# Patient Record
Sex: Male | Born: 1935 | Race: White | Hispanic: No | Marital: Married | Smoking: Former smoker
Health system: Southern US, Community
[De-identification: ages and names within clinical notes are randomized; demographics above are authoritative.]

## PROBLEM LIST (undated history)

## (undated) DIAGNOSIS — C4491 Basal cell carcinoma of skin, unspecified: Secondary | ICD-10-CM

## (undated) DIAGNOSIS — Z87891 Personal history of nicotine dependence: Secondary | ICD-10-CM

## (undated) DIAGNOSIS — I10 Essential (primary) hypertension: Secondary | ICD-10-CM

## (undated) DIAGNOSIS — D099 Carcinoma in situ, unspecified: Secondary | ICD-10-CM

## (undated) DIAGNOSIS — C4492 Squamous cell carcinoma of skin, unspecified: Secondary | ICD-10-CM

## (undated) DIAGNOSIS — I2510 Atherosclerotic heart disease of native coronary artery without angina pectoris: Secondary | ICD-10-CM

## (undated) DIAGNOSIS — H3530 Unspecified macular degeneration: Secondary | ICD-10-CM

## (undated) DIAGNOSIS — E785 Hyperlipidemia, unspecified: Secondary | ICD-10-CM

## (undated) DIAGNOSIS — M1990 Unspecified osteoarthritis, unspecified site: Secondary | ICD-10-CM

## (undated) DIAGNOSIS — C801 Malignant (primary) neoplasm, unspecified: Secondary | ICD-10-CM

## (undated) HISTORY — DX: Squamous cell carcinoma of skin, unspecified: C44.92

## (undated) HISTORY — PX: CARDIAC CATHETERIZATION: SHX172

## (undated) HISTORY — PX: HERNIA REPAIR: SHX51

## (undated) HISTORY — DX: Carcinoma in situ, unspecified: D09.9

## (undated) HISTORY — DX: Basal cell carcinoma of skin, unspecified: C44.91

---

## 1998-07-25 ENCOUNTER — Encounter: Payer: Self-pay | Admitting: Surgery

## 1998-07-27 ENCOUNTER — Ambulatory Visit (HOSPITAL_COMMUNITY): Admission: RE | Admit: 1998-07-27 | Discharge: 1998-07-27 | Payer: Self-pay | Admitting: Surgery

## 2008-03-31 ENCOUNTER — Telehealth: Payer: Self-pay | Admitting: Gastroenterology

## 2013-12-08 DIAGNOSIS — C4491 Basal cell carcinoma of skin, unspecified: Secondary | ICD-10-CM

## 2013-12-08 HISTORY — DX: Basal cell carcinoma of skin, unspecified: C44.91

## 2015-08-15 DIAGNOSIS — C44311 Basal cell carcinoma of skin of nose: Secondary | ICD-10-CM | POA: Diagnosis not present

## 2015-08-15 DIAGNOSIS — C4491 Basal cell carcinoma of skin, unspecified: Secondary | ICD-10-CM

## 2015-08-15 HISTORY — DX: Basal cell carcinoma of skin, unspecified: C44.91

## 2015-09-13 DIAGNOSIS — E784 Other hyperlipidemia: Secondary | ICD-10-CM | POA: Diagnosis not present

## 2015-09-13 DIAGNOSIS — R7301 Impaired fasting glucose: Secondary | ICD-10-CM | POA: Diagnosis not present

## 2015-09-13 DIAGNOSIS — I10 Essential (primary) hypertension: Secondary | ICD-10-CM | POA: Diagnosis not present

## 2015-09-13 DIAGNOSIS — Z125 Encounter for screening for malignant neoplasm of prostate: Secondary | ICD-10-CM | POA: Diagnosis not present

## 2015-09-18 DIAGNOSIS — Z6826 Body mass index (BMI) 26.0-26.9, adult: Secondary | ICD-10-CM | POA: Diagnosis not present

## 2015-09-18 DIAGNOSIS — Z1389 Encounter for screening for other disorder: Secondary | ICD-10-CM | POA: Diagnosis not present

## 2015-09-18 DIAGNOSIS — R9721 Rising PSA following treatment for malignant neoplasm of prostate: Secondary | ICD-10-CM | POA: Diagnosis not present

## 2015-09-18 DIAGNOSIS — Z0000 Encounter for general adult medical examination without abnormal findings: Secondary | ICD-10-CM | POA: Diagnosis not present

## 2015-09-18 DIAGNOSIS — R7301 Impaired fasting glucose: Secondary | ICD-10-CM | POA: Diagnosis not present

## 2015-09-18 DIAGNOSIS — I10 Essential (primary) hypertension: Secondary | ICD-10-CM | POA: Diagnosis not present

## 2015-09-18 DIAGNOSIS — H409 Unspecified glaucoma: Secondary | ICD-10-CM | POA: Diagnosis not present

## 2015-09-18 DIAGNOSIS — E784 Other hyperlipidemia: Secondary | ICD-10-CM | POA: Diagnosis not present

## 2015-09-18 DIAGNOSIS — Z23 Encounter for immunization: Secondary | ICD-10-CM | POA: Diagnosis not present

## 2015-09-18 DIAGNOSIS — K635 Polyp of colon: Secondary | ICD-10-CM | POA: Diagnosis not present

## 2015-09-21 DIAGNOSIS — R972 Elevated prostate specific antigen [PSA]: Secondary | ICD-10-CM | POA: Diagnosis not present

## 2015-09-21 DIAGNOSIS — N400 Enlarged prostate without lower urinary tract symptoms: Secondary | ICD-10-CM | POA: Diagnosis not present

## 2015-10-17 DIAGNOSIS — R972 Elevated prostate specific antigen [PSA]: Secondary | ICD-10-CM | POA: Diagnosis not present

## 2015-11-14 DIAGNOSIS — L570 Actinic keratosis: Secondary | ICD-10-CM | POA: Diagnosis not present

## 2015-11-21 DIAGNOSIS — H31092 Other chorioretinal scars, left eye: Secondary | ICD-10-CM | POA: Diagnosis not present

## 2015-11-21 DIAGNOSIS — H353220 Exudative age-related macular degeneration, left eye, stage unspecified: Secondary | ICD-10-CM | POA: Diagnosis not present

## 2015-11-21 DIAGNOSIS — Z961 Presence of intraocular lens: Secondary | ICD-10-CM | POA: Diagnosis not present

## 2015-11-21 DIAGNOSIS — H40013 Open angle with borderline findings, low risk, bilateral: Secondary | ICD-10-CM | POA: Diagnosis not present

## 2015-11-22 ENCOUNTER — Encounter (INDEPENDENT_AMBULATORY_CARE_PROVIDER_SITE_OTHER): Payer: Medicare Other | Admitting: Ophthalmology

## 2015-11-22 DIAGNOSIS — H43813 Vitreous degeneration, bilateral: Secondary | ICD-10-CM

## 2015-11-22 DIAGNOSIS — H353112 Nonexudative age-related macular degeneration, right eye, intermediate dry stage: Secondary | ICD-10-CM | POA: Diagnosis not present

## 2015-11-22 DIAGNOSIS — H33021 Retinal detachment with multiple breaks, right eye: Secondary | ICD-10-CM

## 2015-11-22 DIAGNOSIS — H353221 Exudative age-related macular degeneration, left eye, with active choroidal neovascularization: Secondary | ICD-10-CM | POA: Diagnosis not present

## 2015-11-22 DIAGNOSIS — I10 Essential (primary) hypertension: Secondary | ICD-10-CM | POA: Diagnosis not present

## 2015-11-22 DIAGNOSIS — H35033 Hypertensive retinopathy, bilateral: Secondary | ICD-10-CM | POA: Diagnosis not present

## 2015-11-24 ENCOUNTER — Encounter (INDEPENDENT_AMBULATORY_CARE_PROVIDER_SITE_OTHER): Payer: Medicare Other | Admitting: Ophthalmology

## 2015-11-24 DIAGNOSIS — H33021 Retinal detachment with multiple breaks, right eye: Secondary | ICD-10-CM | POA: Diagnosis not present

## 2015-12-15 ENCOUNTER — Encounter (INDEPENDENT_AMBULATORY_CARE_PROVIDER_SITE_OTHER): Payer: Medicare Other | Admitting: Ophthalmology

## 2015-12-15 DIAGNOSIS — I10 Essential (primary) hypertension: Secondary | ICD-10-CM | POA: Diagnosis not present

## 2015-12-15 DIAGNOSIS — H43813 Vitreous degeneration, bilateral: Secondary | ICD-10-CM | POA: Diagnosis not present

## 2015-12-15 DIAGNOSIS — H353221 Exudative age-related macular degeneration, left eye, with active choroidal neovascularization: Secondary | ICD-10-CM | POA: Diagnosis not present

## 2015-12-15 DIAGNOSIS — H33021 Retinal detachment with multiple breaks, right eye: Secondary | ICD-10-CM

## 2015-12-15 DIAGNOSIS — H353112 Nonexudative age-related macular degeneration, right eye, intermediate dry stage: Secondary | ICD-10-CM

## 2015-12-15 DIAGNOSIS — H35033 Hypertensive retinopathy, bilateral: Secondary | ICD-10-CM

## 2016-01-12 ENCOUNTER — Encounter (INDEPENDENT_AMBULATORY_CARE_PROVIDER_SITE_OTHER): Payer: Medicare Other | Admitting: Ophthalmology

## 2016-01-12 DIAGNOSIS — H43813 Vitreous degeneration, bilateral: Secondary | ICD-10-CM

## 2016-01-12 DIAGNOSIS — H35033 Hypertensive retinopathy, bilateral: Secondary | ICD-10-CM

## 2016-01-12 DIAGNOSIS — H353221 Exudative age-related macular degeneration, left eye, with active choroidal neovascularization: Secondary | ICD-10-CM | POA: Diagnosis not present

## 2016-01-12 DIAGNOSIS — H33301 Unspecified retinal break, right eye: Secondary | ICD-10-CM | POA: Diagnosis not present

## 2016-01-12 DIAGNOSIS — H353112 Nonexudative age-related macular degeneration, right eye, intermediate dry stage: Secondary | ICD-10-CM

## 2016-01-12 DIAGNOSIS — I10 Essential (primary) hypertension: Secondary | ICD-10-CM | POA: Diagnosis not present

## 2016-02-12 ENCOUNTER — Encounter (INDEPENDENT_AMBULATORY_CARE_PROVIDER_SITE_OTHER): Payer: Medicare Other | Admitting: Ophthalmology

## 2016-02-12 DIAGNOSIS — H353221 Exudative age-related macular degeneration, left eye, with active choroidal neovascularization: Secondary | ICD-10-CM | POA: Diagnosis not present

## 2016-02-12 DIAGNOSIS — H43813 Vitreous degeneration, bilateral: Secondary | ICD-10-CM

## 2016-02-12 DIAGNOSIS — H353111 Nonexudative age-related macular degeneration, right eye, early dry stage: Secondary | ICD-10-CM | POA: Diagnosis not present

## 2016-02-12 DIAGNOSIS — H33301 Unspecified retinal break, right eye: Secondary | ICD-10-CM | POA: Diagnosis not present

## 2016-02-12 DIAGNOSIS — H35033 Hypertensive retinopathy, bilateral: Secondary | ICD-10-CM

## 2016-02-12 DIAGNOSIS — I10 Essential (primary) hypertension: Secondary | ICD-10-CM

## 2016-03-11 ENCOUNTER — Encounter (INDEPENDENT_AMBULATORY_CARE_PROVIDER_SITE_OTHER): Payer: Medicare Other | Admitting: Ophthalmology

## 2016-03-11 DIAGNOSIS — H353221 Exudative age-related macular degeneration, left eye, with active choroidal neovascularization: Secondary | ICD-10-CM

## 2016-03-11 DIAGNOSIS — H43813 Vitreous degeneration, bilateral: Secondary | ICD-10-CM

## 2016-03-11 DIAGNOSIS — H35033 Hypertensive retinopathy, bilateral: Secondary | ICD-10-CM | POA: Diagnosis not present

## 2016-03-11 DIAGNOSIS — I10 Essential (primary) hypertension: Secondary | ICD-10-CM | POA: Diagnosis not present

## 2016-03-11 DIAGNOSIS — H353112 Nonexudative age-related macular degeneration, right eye, intermediate dry stage: Secondary | ICD-10-CM | POA: Diagnosis not present

## 2016-03-11 DIAGNOSIS — H33301 Unspecified retinal break, right eye: Secondary | ICD-10-CM

## 2016-03-20 DIAGNOSIS — Z23 Encounter for immunization: Secondary | ICD-10-CM | POA: Diagnosis not present

## 2016-03-20 DIAGNOSIS — Z6825 Body mass index (BMI) 25.0-25.9, adult: Secondary | ICD-10-CM | POA: Diagnosis not present

## 2016-03-20 DIAGNOSIS — R7301 Impaired fasting glucose: Secondary | ICD-10-CM | POA: Diagnosis not present

## 2016-03-20 DIAGNOSIS — R972 Elevated prostate specific antigen [PSA]: Secondary | ICD-10-CM | POA: Diagnosis not present

## 2016-03-20 DIAGNOSIS — H3530 Unspecified macular degeneration: Secondary | ICD-10-CM | POA: Diagnosis not present

## 2016-03-20 DIAGNOSIS — I10 Essential (primary) hypertension: Secondary | ICD-10-CM | POA: Diagnosis not present

## 2016-03-20 DIAGNOSIS — K635 Polyp of colon: Secondary | ICD-10-CM | POA: Diagnosis not present

## 2016-03-20 DIAGNOSIS — H4089 Other specified glaucoma: Secondary | ICD-10-CM | POA: Diagnosis not present

## 2016-03-20 DIAGNOSIS — E784 Other hyperlipidemia: Secondary | ICD-10-CM | POA: Diagnosis not present

## 2016-04-04 ENCOUNTER — Encounter (INDEPENDENT_AMBULATORY_CARE_PROVIDER_SITE_OTHER): Payer: Medicare Other | Admitting: Ophthalmology

## 2016-04-04 DIAGNOSIS — H353112 Nonexudative age-related macular degeneration, right eye, intermediate dry stage: Secondary | ICD-10-CM | POA: Diagnosis not present

## 2016-04-04 DIAGNOSIS — H33301 Unspecified retinal break, right eye: Secondary | ICD-10-CM | POA: Diagnosis not present

## 2016-04-04 DIAGNOSIS — H43813 Vitreous degeneration, bilateral: Secondary | ICD-10-CM

## 2016-04-04 DIAGNOSIS — I10 Essential (primary) hypertension: Secondary | ICD-10-CM | POA: Diagnosis not present

## 2016-04-04 DIAGNOSIS — H35033 Hypertensive retinopathy, bilateral: Secondary | ICD-10-CM

## 2016-04-04 DIAGNOSIS — H353221 Exudative age-related macular degeneration, left eye, with active choroidal neovascularization: Secondary | ICD-10-CM

## 2016-05-02 ENCOUNTER — Encounter (INDEPENDENT_AMBULATORY_CARE_PROVIDER_SITE_OTHER): Payer: Medicare Other | Admitting: Ophthalmology

## 2016-05-02 DIAGNOSIS — H353112 Nonexudative age-related macular degeneration, right eye, intermediate dry stage: Secondary | ICD-10-CM | POA: Diagnosis not present

## 2016-05-02 DIAGNOSIS — H353221 Exudative age-related macular degeneration, left eye, with active choroidal neovascularization: Secondary | ICD-10-CM

## 2016-05-02 DIAGNOSIS — H43813 Vitreous degeneration, bilateral: Secondary | ICD-10-CM

## 2016-05-02 DIAGNOSIS — H33301 Unspecified retinal break, right eye: Secondary | ICD-10-CM | POA: Diagnosis not present

## 2016-05-02 DIAGNOSIS — H35033 Hypertensive retinopathy, bilateral: Secondary | ICD-10-CM

## 2016-05-02 DIAGNOSIS — I10 Essential (primary) hypertension: Secondary | ICD-10-CM

## 2016-05-23 DIAGNOSIS — C44329 Squamous cell carcinoma of skin of other parts of face: Secondary | ICD-10-CM | POA: Diagnosis not present

## 2016-05-23 DIAGNOSIS — L905 Scar conditions and fibrosis of skin: Secondary | ICD-10-CM | POA: Diagnosis not present

## 2016-05-23 DIAGNOSIS — C4492 Squamous cell carcinoma of skin, unspecified: Secondary | ICD-10-CM

## 2016-05-23 HISTORY — DX: Squamous cell carcinoma of skin, unspecified: C44.92

## 2016-05-30 ENCOUNTER — Encounter (INDEPENDENT_AMBULATORY_CARE_PROVIDER_SITE_OTHER): Payer: Medicare Other | Admitting: Ophthalmology

## 2016-05-30 DIAGNOSIS — H35033 Hypertensive retinopathy, bilateral: Secondary | ICD-10-CM | POA: Diagnosis not present

## 2016-05-30 DIAGNOSIS — I10 Essential (primary) hypertension: Secondary | ICD-10-CM

## 2016-05-30 DIAGNOSIS — H33301 Unspecified retinal break, right eye: Secondary | ICD-10-CM

## 2016-05-30 DIAGNOSIS — H353112 Nonexudative age-related macular degeneration, right eye, intermediate dry stage: Secondary | ICD-10-CM

## 2016-05-30 DIAGNOSIS — H353221 Exudative age-related macular degeneration, left eye, with active choroidal neovascularization: Secondary | ICD-10-CM | POA: Diagnosis not present

## 2016-05-30 DIAGNOSIS — H43813 Vitreous degeneration, bilateral: Secondary | ICD-10-CM

## 2016-06-27 ENCOUNTER — Encounter (INDEPENDENT_AMBULATORY_CARE_PROVIDER_SITE_OTHER): Payer: Medicare Other | Admitting: Ophthalmology

## 2016-06-27 DIAGNOSIS — H33301 Unspecified retinal break, right eye: Secondary | ICD-10-CM | POA: Diagnosis not present

## 2016-06-27 DIAGNOSIS — H353221 Exudative age-related macular degeneration, left eye, with active choroidal neovascularization: Secondary | ICD-10-CM | POA: Diagnosis not present

## 2016-06-27 DIAGNOSIS — H353112 Nonexudative age-related macular degeneration, right eye, intermediate dry stage: Secondary | ICD-10-CM | POA: Diagnosis not present

## 2016-06-27 DIAGNOSIS — H43813 Vitreous degeneration, bilateral: Secondary | ICD-10-CM | POA: Diagnosis not present

## 2016-06-27 DIAGNOSIS — I10 Essential (primary) hypertension: Secondary | ICD-10-CM | POA: Diagnosis not present

## 2016-06-27 DIAGNOSIS — H35033 Hypertensive retinopathy, bilateral: Secondary | ICD-10-CM | POA: Diagnosis not present

## 2016-07-23 DIAGNOSIS — K635 Polyp of colon: Secondary | ICD-10-CM | POA: Diagnosis not present

## 2016-07-23 DIAGNOSIS — H409 Unspecified glaucoma: Secondary | ICD-10-CM | POA: Diagnosis not present

## 2016-07-23 DIAGNOSIS — R202 Paresthesia of skin: Secondary | ICD-10-CM | POA: Diagnosis not present

## 2016-07-23 DIAGNOSIS — I10 Essential (primary) hypertension: Secondary | ICD-10-CM | POA: Diagnosis not present

## 2016-07-23 DIAGNOSIS — E784 Other hyperlipidemia: Secondary | ICD-10-CM | POA: Diagnosis not present

## 2016-07-25 ENCOUNTER — Encounter (INDEPENDENT_AMBULATORY_CARE_PROVIDER_SITE_OTHER): Payer: Medicare Other | Admitting: Ophthalmology

## 2016-07-25 DIAGNOSIS — I10 Essential (primary) hypertension: Secondary | ICD-10-CM

## 2016-07-25 DIAGNOSIS — H33301 Unspecified retinal break, right eye: Secondary | ICD-10-CM

## 2016-07-25 DIAGNOSIS — H353111 Nonexudative age-related macular degeneration, right eye, early dry stage: Secondary | ICD-10-CM | POA: Diagnosis not present

## 2016-07-25 DIAGNOSIS — H353221 Exudative age-related macular degeneration, left eye, with active choroidal neovascularization: Secondary | ICD-10-CM | POA: Diagnosis not present

## 2016-07-25 DIAGNOSIS — H35033 Hypertensive retinopathy, bilateral: Secondary | ICD-10-CM | POA: Diagnosis not present

## 2016-07-25 DIAGNOSIS — H43813 Vitreous degeneration, bilateral: Secondary | ICD-10-CM

## 2016-08-07 DIAGNOSIS — Q100 Congenital ptosis: Secondary | ICD-10-CM | POA: Diagnosis not present

## 2016-08-07 DIAGNOSIS — H353222 Exudative age-related macular degeneration, left eye, with inactive choroidal neovascularization: Secondary | ICD-10-CM | POA: Diagnosis not present

## 2016-08-07 DIAGNOSIS — H40013 Open angle with borderline findings, low risk, bilateral: Secondary | ICD-10-CM | POA: Diagnosis not present

## 2016-08-07 DIAGNOSIS — Z961 Presence of intraocular lens: Secondary | ICD-10-CM | POA: Diagnosis not present

## 2016-08-22 ENCOUNTER — Encounter (INDEPENDENT_AMBULATORY_CARE_PROVIDER_SITE_OTHER): Payer: Medicare Other | Admitting: Ophthalmology

## 2016-08-22 DIAGNOSIS — H43813 Vitreous degeneration, bilateral: Secondary | ICD-10-CM

## 2016-08-22 DIAGNOSIS — H35033 Hypertensive retinopathy, bilateral: Secondary | ICD-10-CM

## 2016-08-22 DIAGNOSIS — I10 Essential (primary) hypertension: Secondary | ICD-10-CM

## 2016-08-22 DIAGNOSIS — H353112 Nonexudative age-related macular degeneration, right eye, intermediate dry stage: Secondary | ICD-10-CM

## 2016-08-22 DIAGNOSIS — H353221 Exudative age-related macular degeneration, left eye, with active choroidal neovascularization: Secondary | ICD-10-CM | POA: Diagnosis not present

## 2016-08-22 DIAGNOSIS — H33301 Unspecified retinal break, right eye: Secondary | ICD-10-CM | POA: Diagnosis not present

## 2016-09-24 DIAGNOSIS — R7301 Impaired fasting glucose: Secondary | ICD-10-CM | POA: Diagnosis not present

## 2016-09-24 DIAGNOSIS — Z125 Encounter for screening for malignant neoplasm of prostate: Secondary | ICD-10-CM | POA: Diagnosis not present

## 2016-09-24 DIAGNOSIS — E784 Other hyperlipidemia: Secondary | ICD-10-CM | POA: Diagnosis not present

## 2016-09-24 DIAGNOSIS — I10 Essential (primary) hypertension: Secondary | ICD-10-CM | POA: Diagnosis not present

## 2016-09-26 ENCOUNTER — Encounter (INDEPENDENT_AMBULATORY_CARE_PROVIDER_SITE_OTHER): Payer: Medicare Other | Admitting: Ophthalmology

## 2016-09-26 DIAGNOSIS — H353221 Exudative age-related macular degeneration, left eye, with active choroidal neovascularization: Secondary | ICD-10-CM | POA: Diagnosis not present

## 2016-09-26 DIAGNOSIS — H35033 Hypertensive retinopathy, bilateral: Secondary | ICD-10-CM | POA: Diagnosis not present

## 2016-09-26 DIAGNOSIS — I10 Essential (primary) hypertension: Secondary | ICD-10-CM | POA: Diagnosis not present

## 2016-09-26 DIAGNOSIS — H43813 Vitreous degeneration, bilateral: Secondary | ICD-10-CM

## 2016-09-26 DIAGNOSIS — H353112 Nonexudative age-related macular degeneration, right eye, intermediate dry stage: Secondary | ICD-10-CM | POA: Diagnosis not present

## 2016-09-26 DIAGNOSIS — H33301 Unspecified retinal break, right eye: Secondary | ICD-10-CM

## 2016-10-24 ENCOUNTER — Encounter (INDEPENDENT_AMBULATORY_CARE_PROVIDER_SITE_OTHER): Payer: Medicare Other | Admitting: Ophthalmology

## 2016-10-24 DIAGNOSIS — I10 Essential (primary) hypertension: Secondary | ICD-10-CM

## 2016-10-24 DIAGNOSIS — H353112 Nonexudative age-related macular degeneration, right eye, intermediate dry stage: Secondary | ICD-10-CM | POA: Diagnosis not present

## 2016-10-24 DIAGNOSIS — H43813 Vitreous degeneration, bilateral: Secondary | ICD-10-CM

## 2016-10-24 DIAGNOSIS — H35033 Hypertensive retinopathy, bilateral: Secondary | ICD-10-CM | POA: Diagnosis not present

## 2016-10-24 DIAGNOSIS — H353221 Exudative age-related macular degeneration, left eye, with active choroidal neovascularization: Secondary | ICD-10-CM

## 2016-10-24 DIAGNOSIS — H33301 Unspecified retinal break, right eye: Secondary | ICD-10-CM

## 2016-10-28 DIAGNOSIS — R7301 Impaired fasting glucose: Secondary | ICD-10-CM | POA: Diagnosis not present

## 2016-10-28 DIAGNOSIS — H3530 Unspecified macular degeneration: Secondary | ICD-10-CM | POA: Diagnosis not present

## 2016-10-28 DIAGNOSIS — K635 Polyp of colon: Secondary | ICD-10-CM | POA: Diagnosis not present

## 2016-10-28 DIAGNOSIS — Z0000 Encounter for general adult medical examination without abnormal findings: Secondary | ICD-10-CM | POA: Diagnosis not present

## 2016-11-21 ENCOUNTER — Encounter (INDEPENDENT_AMBULATORY_CARE_PROVIDER_SITE_OTHER): Payer: Medicare Other | Admitting: Ophthalmology

## 2016-11-22 ENCOUNTER — Encounter (INDEPENDENT_AMBULATORY_CARE_PROVIDER_SITE_OTHER): Payer: Medicare Other | Admitting: Ophthalmology

## 2016-11-22 DIAGNOSIS — H35033 Hypertensive retinopathy, bilateral: Secondary | ICD-10-CM | POA: Diagnosis not present

## 2016-11-22 DIAGNOSIS — H33301 Unspecified retinal break, right eye: Secondary | ICD-10-CM | POA: Diagnosis not present

## 2016-11-22 DIAGNOSIS — H43813 Vitreous degeneration, bilateral: Secondary | ICD-10-CM | POA: Diagnosis not present

## 2016-11-22 DIAGNOSIS — H353112 Nonexudative age-related macular degeneration, right eye, intermediate dry stage: Secondary | ICD-10-CM

## 2016-11-22 DIAGNOSIS — H353221 Exudative age-related macular degeneration, left eye, with active choroidal neovascularization: Secondary | ICD-10-CM

## 2016-11-22 DIAGNOSIS — I10 Essential (primary) hypertension: Secondary | ICD-10-CM

## 2016-11-26 DIAGNOSIS — M25531 Pain in right wrist: Secondary | ICD-10-CM | POA: Diagnosis not present

## 2016-12-23 ENCOUNTER — Encounter (INDEPENDENT_AMBULATORY_CARE_PROVIDER_SITE_OTHER): Payer: Medicare Other | Admitting: Ophthalmology

## 2016-12-23 DIAGNOSIS — H33301 Unspecified retinal break, right eye: Secondary | ICD-10-CM

## 2016-12-23 DIAGNOSIS — H353112 Nonexudative age-related macular degeneration, right eye, intermediate dry stage: Secondary | ICD-10-CM

## 2016-12-23 DIAGNOSIS — H353221 Exudative age-related macular degeneration, left eye, with active choroidal neovascularization: Secondary | ICD-10-CM

## 2016-12-23 DIAGNOSIS — I10 Essential (primary) hypertension: Secondary | ICD-10-CM | POA: Diagnosis not present

## 2016-12-23 DIAGNOSIS — H35033 Hypertensive retinopathy, bilateral: Secondary | ICD-10-CM

## 2016-12-23 DIAGNOSIS — H43813 Vitreous degeneration, bilateral: Secondary | ICD-10-CM | POA: Diagnosis not present

## 2017-01-20 ENCOUNTER — Encounter (INDEPENDENT_AMBULATORY_CARE_PROVIDER_SITE_OTHER): Payer: Medicare Other | Admitting: Ophthalmology

## 2017-01-20 DIAGNOSIS — H353112 Nonexudative age-related macular degeneration, right eye, intermediate dry stage: Secondary | ICD-10-CM | POA: Diagnosis not present

## 2017-01-20 DIAGNOSIS — H353221 Exudative age-related macular degeneration, left eye, with active choroidal neovascularization: Secondary | ICD-10-CM

## 2017-01-20 DIAGNOSIS — H35033 Hypertensive retinopathy, bilateral: Secondary | ICD-10-CM | POA: Diagnosis not present

## 2017-01-20 DIAGNOSIS — H43813 Vitreous degeneration, bilateral: Secondary | ICD-10-CM | POA: Diagnosis not present

## 2017-01-20 DIAGNOSIS — I10 Essential (primary) hypertension: Secondary | ICD-10-CM

## 2017-01-20 DIAGNOSIS — H33301 Unspecified retinal break, right eye: Secondary | ICD-10-CM | POA: Diagnosis not present

## 2017-01-29 DIAGNOSIS — E784 Other hyperlipidemia: Secondary | ICD-10-CM | POA: Diagnosis not present

## 2017-01-29 DIAGNOSIS — R7301 Impaired fasting glucose: Secondary | ICD-10-CM | POA: Diagnosis not present

## 2017-01-29 DIAGNOSIS — I10 Essential (primary) hypertension: Secondary | ICD-10-CM | POA: Diagnosis not present

## 2017-01-29 DIAGNOSIS — K635 Polyp of colon: Secondary | ICD-10-CM | POA: Diagnosis not present

## 2017-02-18 ENCOUNTER — Encounter (INDEPENDENT_AMBULATORY_CARE_PROVIDER_SITE_OTHER): Payer: Medicare Other | Admitting: Ophthalmology

## 2017-02-18 DIAGNOSIS — I10 Essential (primary) hypertension: Secondary | ICD-10-CM

## 2017-02-18 DIAGNOSIS — H33301 Unspecified retinal break, right eye: Secondary | ICD-10-CM

## 2017-02-18 DIAGNOSIS — H43813 Vitreous degeneration, bilateral: Secondary | ICD-10-CM

## 2017-02-18 DIAGNOSIS — H353221 Exudative age-related macular degeneration, left eye, with active choroidal neovascularization: Secondary | ICD-10-CM

## 2017-02-18 DIAGNOSIS — H353112 Nonexudative age-related macular degeneration, right eye, intermediate dry stage: Secondary | ICD-10-CM

## 2017-02-18 DIAGNOSIS — H35033 Hypertensive retinopathy, bilateral: Secondary | ICD-10-CM | POA: Diagnosis not present

## 2017-03-18 ENCOUNTER — Encounter (INDEPENDENT_AMBULATORY_CARE_PROVIDER_SITE_OTHER): Payer: Medicare Other | Admitting: Ophthalmology

## 2017-03-18 DIAGNOSIS — H43813 Vitreous degeneration, bilateral: Secondary | ICD-10-CM | POA: Diagnosis not present

## 2017-03-18 DIAGNOSIS — H35033 Hypertensive retinopathy, bilateral: Secondary | ICD-10-CM

## 2017-03-18 DIAGNOSIS — H353221 Exudative age-related macular degeneration, left eye, with active choroidal neovascularization: Secondary | ICD-10-CM

## 2017-03-18 DIAGNOSIS — H353112 Nonexudative age-related macular degeneration, right eye, intermediate dry stage: Secondary | ICD-10-CM

## 2017-03-18 DIAGNOSIS — I10 Essential (primary) hypertension: Secondary | ICD-10-CM | POA: Diagnosis not present

## 2017-03-18 DIAGNOSIS — H33301 Unspecified retinal break, right eye: Secondary | ICD-10-CM | POA: Diagnosis not present

## 2017-04-15 ENCOUNTER — Encounter (INDEPENDENT_AMBULATORY_CARE_PROVIDER_SITE_OTHER): Payer: Medicare Other | Admitting: Ophthalmology

## 2017-04-15 DIAGNOSIS — H353221 Exudative age-related macular degeneration, left eye, with active choroidal neovascularization: Secondary | ICD-10-CM | POA: Diagnosis not present

## 2017-04-15 DIAGNOSIS — I10 Essential (primary) hypertension: Secondary | ICD-10-CM | POA: Diagnosis not present

## 2017-04-15 DIAGNOSIS — H43813 Vitreous degeneration, bilateral: Secondary | ICD-10-CM | POA: Diagnosis not present

## 2017-04-15 DIAGNOSIS — H353112 Nonexudative age-related macular degeneration, right eye, intermediate dry stage: Secondary | ICD-10-CM | POA: Diagnosis not present

## 2017-04-15 DIAGNOSIS — H35033 Hypertensive retinopathy, bilateral: Secondary | ICD-10-CM | POA: Diagnosis not present

## 2017-04-15 DIAGNOSIS — H33301 Unspecified retinal break, right eye: Secondary | ICD-10-CM | POA: Diagnosis not present

## 2017-05-13 ENCOUNTER — Encounter (INDEPENDENT_AMBULATORY_CARE_PROVIDER_SITE_OTHER): Payer: Medicare Other | Admitting: Ophthalmology

## 2017-05-13 DIAGNOSIS — H353112 Nonexudative age-related macular degeneration, right eye, intermediate dry stage: Secondary | ICD-10-CM | POA: Diagnosis not present

## 2017-05-13 DIAGNOSIS — I10 Essential (primary) hypertension: Secondary | ICD-10-CM

## 2017-05-13 DIAGNOSIS — H43813 Vitreous degeneration, bilateral: Secondary | ICD-10-CM

## 2017-05-13 DIAGNOSIS — H35033 Hypertensive retinopathy, bilateral: Secondary | ICD-10-CM

## 2017-05-13 DIAGNOSIS — H33301 Unspecified retinal break, right eye: Secondary | ICD-10-CM

## 2017-05-13 DIAGNOSIS — H353221 Exudative age-related macular degeneration, left eye, with active choroidal neovascularization: Secondary | ICD-10-CM | POA: Diagnosis not present

## 2017-05-20 DIAGNOSIS — D0439 Carcinoma in situ of skin of other parts of face: Secondary | ICD-10-CM | POA: Diagnosis not present

## 2017-05-20 DIAGNOSIS — C44311 Basal cell carcinoma of skin of nose: Secondary | ICD-10-CM | POA: Diagnosis not present

## 2017-05-20 DIAGNOSIS — D099 Carcinoma in situ, unspecified: Secondary | ICD-10-CM

## 2017-05-20 DIAGNOSIS — L570 Actinic keratosis: Secondary | ICD-10-CM | POA: Diagnosis not present

## 2017-05-20 HISTORY — DX: Carcinoma in situ, unspecified: D09.9

## 2017-06-04 DIAGNOSIS — Z0000 Encounter for general adult medical examination without abnormal findings: Secondary | ICD-10-CM | POA: Diagnosis not present

## 2017-06-04 DIAGNOSIS — E7849 Other hyperlipidemia: Secondary | ICD-10-CM | POA: Diagnosis not present

## 2017-06-04 DIAGNOSIS — R972 Elevated prostate specific antigen [PSA]: Secondary | ICD-10-CM | POA: Diagnosis not present

## 2017-06-04 DIAGNOSIS — I10 Essential (primary) hypertension: Secondary | ICD-10-CM | POA: Diagnosis not present

## 2017-06-06 ENCOUNTER — Encounter (INDEPENDENT_AMBULATORY_CARE_PROVIDER_SITE_OTHER): Payer: Medicare Other | Admitting: Ophthalmology

## 2017-06-06 DIAGNOSIS — H353112 Nonexudative age-related macular degeneration, right eye, intermediate dry stage: Secondary | ICD-10-CM | POA: Diagnosis not present

## 2017-06-06 DIAGNOSIS — H33301 Unspecified retinal break, right eye: Secondary | ICD-10-CM

## 2017-06-06 DIAGNOSIS — H35033 Hypertensive retinopathy, bilateral: Secondary | ICD-10-CM | POA: Diagnosis not present

## 2017-06-06 DIAGNOSIS — H43813 Vitreous degeneration, bilateral: Secondary | ICD-10-CM

## 2017-06-06 DIAGNOSIS — H353221 Exudative age-related macular degeneration, left eye, with active choroidal neovascularization: Secondary | ICD-10-CM | POA: Diagnosis not present

## 2017-06-06 DIAGNOSIS — I10 Essential (primary) hypertension: Secondary | ICD-10-CM

## 2017-06-26 DIAGNOSIS — C44329 Squamous cell carcinoma of skin of other parts of face: Secondary | ICD-10-CM | POA: Diagnosis not present

## 2017-06-26 DIAGNOSIS — L905 Scar conditions and fibrosis of skin: Secondary | ICD-10-CM | POA: Diagnosis not present

## 2017-07-04 ENCOUNTER — Encounter (INDEPENDENT_AMBULATORY_CARE_PROVIDER_SITE_OTHER): Payer: Medicare Other | Admitting: Ophthalmology

## 2017-07-04 DIAGNOSIS — H353112 Nonexudative age-related macular degeneration, right eye, intermediate dry stage: Secondary | ICD-10-CM | POA: Diagnosis not present

## 2017-07-04 DIAGNOSIS — H35033 Hypertensive retinopathy, bilateral: Secondary | ICD-10-CM

## 2017-07-04 DIAGNOSIS — H353221 Exudative age-related macular degeneration, left eye, with active choroidal neovascularization: Secondary | ICD-10-CM

## 2017-07-04 DIAGNOSIS — H33301 Unspecified retinal break, right eye: Secondary | ICD-10-CM | POA: Diagnosis not present

## 2017-07-04 DIAGNOSIS — I10 Essential (primary) hypertension: Secondary | ICD-10-CM | POA: Diagnosis not present

## 2017-07-04 DIAGNOSIS — H43813 Vitreous degeneration, bilateral: Secondary | ICD-10-CM

## 2017-07-08 DIAGNOSIS — H401122 Primary open-angle glaucoma, left eye, moderate stage: Secondary | ICD-10-CM | POA: Diagnosis not present

## 2017-07-08 DIAGNOSIS — H401111 Primary open-angle glaucoma, right eye, mild stage: Secondary | ICD-10-CM | POA: Diagnosis not present

## 2017-07-08 DIAGNOSIS — Z961 Presence of intraocular lens: Secondary | ICD-10-CM | POA: Diagnosis not present

## 2017-07-08 DIAGNOSIS — H353111 Nonexudative age-related macular degeneration, right eye, early dry stage: Secondary | ICD-10-CM | POA: Diagnosis not present

## 2017-07-23 DIAGNOSIS — H401111 Primary open-angle glaucoma, right eye, mild stage: Secondary | ICD-10-CM | POA: Diagnosis not present

## 2017-07-23 DIAGNOSIS — H401122 Primary open-angle glaucoma, left eye, moderate stage: Secondary | ICD-10-CM | POA: Diagnosis not present

## 2017-07-29 DIAGNOSIS — R7301 Impaired fasting glucose: Secondary | ICD-10-CM | POA: Diagnosis not present

## 2017-07-29 DIAGNOSIS — R972 Elevated prostate specific antigen [PSA]: Secondary | ICD-10-CM | POA: Diagnosis not present

## 2017-07-29 DIAGNOSIS — H3530 Unspecified macular degeneration: Secondary | ICD-10-CM | POA: Diagnosis not present

## 2017-07-29 DIAGNOSIS — I10 Essential (primary) hypertension: Secondary | ICD-10-CM | POA: Diagnosis not present

## 2017-08-01 ENCOUNTER — Encounter (INDEPENDENT_AMBULATORY_CARE_PROVIDER_SITE_OTHER): Payer: Medicare Other | Admitting: Ophthalmology

## 2017-08-01 DIAGNOSIS — H35033 Hypertensive retinopathy, bilateral: Secondary | ICD-10-CM

## 2017-08-01 DIAGNOSIS — I10 Essential (primary) hypertension: Secondary | ICD-10-CM

## 2017-08-01 DIAGNOSIS — H353221 Exudative age-related macular degeneration, left eye, with active choroidal neovascularization: Secondary | ICD-10-CM | POA: Diagnosis not present

## 2017-08-01 DIAGNOSIS — H33301 Unspecified retinal break, right eye: Secondary | ICD-10-CM

## 2017-08-01 DIAGNOSIS — H43813 Vitreous degeneration, bilateral: Secondary | ICD-10-CM

## 2017-08-01 DIAGNOSIS — H353112 Nonexudative age-related macular degeneration, right eye, intermediate dry stage: Secondary | ICD-10-CM

## 2017-08-29 ENCOUNTER — Encounter (INDEPENDENT_AMBULATORY_CARE_PROVIDER_SITE_OTHER): Payer: Medicare Other | Admitting: Ophthalmology

## 2017-08-29 DIAGNOSIS — H33301 Unspecified retinal break, right eye: Secondary | ICD-10-CM

## 2017-08-29 DIAGNOSIS — H353221 Exudative age-related macular degeneration, left eye, with active choroidal neovascularization: Secondary | ICD-10-CM | POA: Diagnosis not present

## 2017-08-29 DIAGNOSIS — I10 Essential (primary) hypertension: Secondary | ICD-10-CM | POA: Diagnosis not present

## 2017-08-29 DIAGNOSIS — H35033 Hypertensive retinopathy, bilateral: Secondary | ICD-10-CM

## 2017-08-29 DIAGNOSIS — H43813 Vitreous degeneration, bilateral: Secondary | ICD-10-CM

## 2017-08-29 DIAGNOSIS — H353112 Nonexudative age-related macular degeneration, right eye, intermediate dry stage: Secondary | ICD-10-CM

## 2017-09-08 DIAGNOSIS — C44311 Basal cell carcinoma of skin of nose: Secondary | ICD-10-CM | POA: Diagnosis not present

## 2017-09-08 DIAGNOSIS — L570 Actinic keratosis: Secondary | ICD-10-CM | POA: Diagnosis not present

## 2017-09-08 DIAGNOSIS — C4441 Basal cell carcinoma of skin of scalp and neck: Secondary | ICD-10-CM | POA: Diagnosis not present

## 2017-09-26 ENCOUNTER — Encounter (INDEPENDENT_AMBULATORY_CARE_PROVIDER_SITE_OTHER): Payer: Medicare Other | Admitting: Ophthalmology

## 2017-09-29 ENCOUNTER — Encounter (INDEPENDENT_AMBULATORY_CARE_PROVIDER_SITE_OTHER): Payer: Medicare Other | Admitting: Ophthalmology

## 2017-09-29 DIAGNOSIS — H43813 Vitreous degeneration, bilateral: Secondary | ICD-10-CM

## 2017-09-29 DIAGNOSIS — I10 Essential (primary) hypertension: Secondary | ICD-10-CM

## 2017-09-29 DIAGNOSIS — H353221 Exudative age-related macular degeneration, left eye, with active choroidal neovascularization: Secondary | ICD-10-CM

## 2017-09-29 DIAGNOSIS — H33301 Unspecified retinal break, right eye: Secondary | ICD-10-CM

## 2017-09-29 DIAGNOSIS — H353112 Nonexudative age-related macular degeneration, right eye, intermediate dry stage: Secondary | ICD-10-CM

## 2017-09-29 DIAGNOSIS — H35033 Hypertensive retinopathy, bilateral: Secondary | ICD-10-CM

## 2017-10-16 DIAGNOSIS — C4441 Basal cell carcinoma of skin of scalp and neck: Secondary | ICD-10-CM | POA: Diagnosis not present

## 2017-10-16 DIAGNOSIS — L570 Actinic keratosis: Secondary | ICD-10-CM | POA: Diagnosis not present

## 2017-10-27 ENCOUNTER — Encounter (INDEPENDENT_AMBULATORY_CARE_PROVIDER_SITE_OTHER): Payer: Medicare Other | Admitting: Ophthalmology

## 2017-10-27 DIAGNOSIS — H353112 Nonexudative age-related macular degeneration, right eye, intermediate dry stage: Secondary | ICD-10-CM

## 2017-10-27 DIAGNOSIS — H35033 Hypertensive retinopathy, bilateral: Secondary | ICD-10-CM | POA: Diagnosis not present

## 2017-10-27 DIAGNOSIS — H43813 Vitreous degeneration, bilateral: Secondary | ICD-10-CM

## 2017-10-27 DIAGNOSIS — H353221 Exudative age-related macular degeneration, left eye, with active choroidal neovascularization: Secondary | ICD-10-CM

## 2017-10-27 DIAGNOSIS — I10 Essential (primary) hypertension: Secondary | ICD-10-CM | POA: Diagnosis not present

## 2017-10-27 DIAGNOSIS — H33301 Unspecified retinal break, right eye: Secondary | ICD-10-CM

## 2017-11-03 DIAGNOSIS — C44311 Basal cell carcinoma of skin of nose: Secondary | ICD-10-CM | POA: Diagnosis not present

## 2017-11-06 DIAGNOSIS — T8189XD Other complications of procedures, not elsewhere classified, subsequent encounter: Secondary | ICD-10-CM | POA: Diagnosis not present

## 2017-11-26 ENCOUNTER — Encounter (INDEPENDENT_AMBULATORY_CARE_PROVIDER_SITE_OTHER): Payer: Medicare Other | Admitting: Ophthalmology

## 2017-11-26 DIAGNOSIS — H353221 Exudative age-related macular degeneration, left eye, with active choroidal neovascularization: Secondary | ICD-10-CM

## 2017-11-26 DIAGNOSIS — H35033 Hypertensive retinopathy, bilateral: Secondary | ICD-10-CM | POA: Diagnosis not present

## 2017-11-26 DIAGNOSIS — H43813 Vitreous degeneration, bilateral: Secondary | ICD-10-CM

## 2017-11-26 DIAGNOSIS — H353112 Nonexudative age-related macular degeneration, right eye, intermediate dry stage: Secondary | ICD-10-CM | POA: Diagnosis not present

## 2017-11-26 DIAGNOSIS — I10 Essential (primary) hypertension: Secondary | ICD-10-CM

## 2017-11-26 DIAGNOSIS — H33301 Unspecified retinal break, right eye: Secondary | ICD-10-CM

## 2017-12-23 ENCOUNTER — Encounter (INDEPENDENT_AMBULATORY_CARE_PROVIDER_SITE_OTHER): Payer: Medicare Other | Admitting: Ophthalmology

## 2017-12-23 DIAGNOSIS — H353112 Nonexudative age-related macular degeneration, right eye, intermediate dry stage: Secondary | ICD-10-CM

## 2017-12-23 DIAGNOSIS — H35033 Hypertensive retinopathy, bilateral: Secondary | ICD-10-CM | POA: Diagnosis not present

## 2017-12-23 DIAGNOSIS — H43813 Vitreous degeneration, bilateral: Secondary | ICD-10-CM

## 2017-12-23 DIAGNOSIS — H353221 Exudative age-related macular degeneration, left eye, with active choroidal neovascularization: Secondary | ICD-10-CM | POA: Diagnosis not present

## 2017-12-23 DIAGNOSIS — H33301 Unspecified retinal break, right eye: Secondary | ICD-10-CM | POA: Diagnosis not present

## 2017-12-23 DIAGNOSIS — I10 Essential (primary) hypertension: Secondary | ICD-10-CM

## 2018-01-20 ENCOUNTER — Encounter (INDEPENDENT_AMBULATORY_CARE_PROVIDER_SITE_OTHER): Payer: Medicare Other | Admitting: Ophthalmology

## 2018-01-20 DIAGNOSIS — I10 Essential (primary) hypertension: Secondary | ICD-10-CM

## 2018-01-20 DIAGNOSIS — H35033 Hypertensive retinopathy, bilateral: Secondary | ICD-10-CM

## 2018-01-20 DIAGNOSIS — H353112 Nonexudative age-related macular degeneration, right eye, intermediate dry stage: Secondary | ICD-10-CM

## 2018-01-20 DIAGNOSIS — H43813 Vitreous degeneration, bilateral: Secondary | ICD-10-CM

## 2018-01-20 DIAGNOSIS — H353221 Exudative age-related macular degeneration, left eye, with active choroidal neovascularization: Secondary | ICD-10-CM

## 2018-01-21 DIAGNOSIS — R7301 Impaired fasting glucose: Secondary | ICD-10-CM | POA: Diagnosis not present

## 2018-01-21 DIAGNOSIS — R82998 Other abnormal findings in urine: Secondary | ICD-10-CM | POA: Diagnosis not present

## 2018-01-21 DIAGNOSIS — I10 Essential (primary) hypertension: Secondary | ICD-10-CM | POA: Diagnosis not present

## 2018-01-21 DIAGNOSIS — Z125 Encounter for screening for malignant neoplasm of prostate: Secondary | ICD-10-CM | POA: Diagnosis not present

## 2018-01-22 DIAGNOSIS — H401122 Primary open-angle glaucoma, left eye, moderate stage: Secondary | ICD-10-CM | POA: Diagnosis not present

## 2018-01-22 DIAGNOSIS — H401111 Primary open-angle glaucoma, right eye, mild stage: Secondary | ICD-10-CM | POA: Diagnosis not present

## 2018-01-28 DIAGNOSIS — I10 Essential (primary) hypertension: Secondary | ICD-10-CM | POA: Diagnosis not present

## 2018-01-28 DIAGNOSIS — Z0000 Encounter for general adult medical examination without abnormal findings: Secondary | ICD-10-CM | POA: Diagnosis not present

## 2018-01-28 DIAGNOSIS — H3530 Unspecified macular degeneration: Secondary | ICD-10-CM | POA: Diagnosis not present

## 2018-01-28 DIAGNOSIS — K635 Polyp of colon: Secondary | ICD-10-CM | POA: Diagnosis not present

## 2018-02-06 DIAGNOSIS — Z1212 Encounter for screening for malignant neoplasm of rectum: Secondary | ICD-10-CM | POA: Diagnosis not present

## 2018-02-18 ENCOUNTER — Encounter (INDEPENDENT_AMBULATORY_CARE_PROVIDER_SITE_OTHER): Payer: Medicare Other | Admitting: Ophthalmology

## 2018-02-18 DIAGNOSIS — H353112 Nonexudative age-related macular degeneration, right eye, intermediate dry stage: Secondary | ICD-10-CM | POA: Diagnosis not present

## 2018-02-18 DIAGNOSIS — H35033 Hypertensive retinopathy, bilateral: Secondary | ICD-10-CM | POA: Diagnosis not present

## 2018-02-18 DIAGNOSIS — H353221 Exudative age-related macular degeneration, left eye, with active choroidal neovascularization: Secondary | ICD-10-CM | POA: Diagnosis not present

## 2018-02-18 DIAGNOSIS — H43813 Vitreous degeneration, bilateral: Secondary | ICD-10-CM

## 2018-02-18 DIAGNOSIS — H33301 Unspecified retinal break, right eye: Secondary | ICD-10-CM

## 2018-02-18 DIAGNOSIS — I10 Essential (primary) hypertension: Secondary | ICD-10-CM

## 2018-03-05 DIAGNOSIS — Z23 Encounter for immunization: Secondary | ICD-10-CM | POA: Diagnosis not present

## 2018-03-18 ENCOUNTER — Encounter (INDEPENDENT_AMBULATORY_CARE_PROVIDER_SITE_OTHER): Payer: Medicare Other | Admitting: Ophthalmology

## 2018-03-18 DIAGNOSIS — H43813 Vitreous degeneration, bilateral: Secondary | ICD-10-CM

## 2018-03-18 DIAGNOSIS — I10 Essential (primary) hypertension: Secondary | ICD-10-CM

## 2018-03-18 DIAGNOSIS — H353221 Exudative age-related macular degeneration, left eye, with active choroidal neovascularization: Secondary | ICD-10-CM

## 2018-03-18 DIAGNOSIS — H353112 Nonexudative age-related macular degeneration, right eye, intermediate dry stage: Secondary | ICD-10-CM | POA: Diagnosis not present

## 2018-03-18 DIAGNOSIS — H35033 Hypertensive retinopathy, bilateral: Secondary | ICD-10-CM

## 2018-03-18 DIAGNOSIS — H33301 Unspecified retinal break, right eye: Secondary | ICD-10-CM

## 2018-04-13 ENCOUNTER — Encounter (INDEPENDENT_AMBULATORY_CARE_PROVIDER_SITE_OTHER): Payer: Medicare Other | Admitting: Ophthalmology

## 2018-04-13 DIAGNOSIS — I10 Essential (primary) hypertension: Secondary | ICD-10-CM | POA: Diagnosis not present

## 2018-04-13 DIAGNOSIS — H35033 Hypertensive retinopathy, bilateral: Secondary | ICD-10-CM | POA: Diagnosis not present

## 2018-04-13 DIAGNOSIS — H353221 Exudative age-related macular degeneration, left eye, with active choroidal neovascularization: Secondary | ICD-10-CM

## 2018-04-13 DIAGNOSIS — H33301 Unspecified retinal break, right eye: Secondary | ICD-10-CM

## 2018-04-13 DIAGNOSIS — H353112 Nonexudative age-related macular degeneration, right eye, intermediate dry stage: Secondary | ICD-10-CM

## 2018-04-13 DIAGNOSIS — H43813 Vitreous degeneration, bilateral: Secondary | ICD-10-CM

## 2018-04-20 DIAGNOSIS — L570 Actinic keratosis: Secondary | ICD-10-CM | POA: Diagnosis not present

## 2018-04-20 DIAGNOSIS — L821 Other seborrheic keratosis: Secondary | ICD-10-CM | POA: Diagnosis not present

## 2018-05-04 ENCOUNTER — Encounter (HOSPITAL_COMMUNITY): Payer: Self-pay | Admitting: Emergency Medicine

## 2018-05-04 ENCOUNTER — Inpatient Hospital Stay (HOSPITAL_COMMUNITY)
Admission: EM | Admit: 2018-05-04 | Discharge: 2018-05-06 | DRG: 247 | Disposition: A | Discharge status: Home / Self Care | Payer: Medicare Other | Attending: Cardiovascular Disease | Admitting: Cardiovascular Disease

## 2018-05-04 ENCOUNTER — Emergency Department (HOSPITAL_COMMUNITY): Payer: Medicare Other

## 2018-05-04 ENCOUNTER — Observation Stay (HOSPITAL_BASED_OUTPATIENT_CLINIC_OR_DEPARTMENT_OTHER): Payer: Medicare Other

## 2018-05-04 ENCOUNTER — Other Ambulatory Visit: Payer: Self-pay

## 2018-05-04 DIAGNOSIS — R55 Syncope and collapse: Secondary | ICD-10-CM | POA: Diagnosis present

## 2018-05-04 DIAGNOSIS — I459 Conduction disorder, unspecified: Secondary | ICD-10-CM | POA: Diagnosis not present

## 2018-05-04 DIAGNOSIS — I361 Nonrheumatic tricuspid (valve) insufficiency: Secondary | ICD-10-CM

## 2018-05-04 DIAGNOSIS — H3530 Unspecified macular degeneration: Secondary | ICD-10-CM | POA: Diagnosis present

## 2018-05-04 DIAGNOSIS — I071 Rheumatic tricuspid insufficiency: Secondary | ICD-10-CM | POA: Diagnosis not present

## 2018-05-04 DIAGNOSIS — Z87891 Personal history of nicotine dependence: Secondary | ICD-10-CM | POA: Diagnosis not present

## 2018-05-04 DIAGNOSIS — I4581 Long QT syndrome: Secondary | ICD-10-CM | POA: Diagnosis not present

## 2018-05-04 DIAGNOSIS — I213 ST elevation (STEMI) myocardial infarction of unspecified site: Secondary | ICD-10-CM | POA: Diagnosis not present

## 2018-05-04 DIAGNOSIS — I2510 Atherosclerotic heart disease of native coronary artery without angina pectoris: Secondary | ICD-10-CM | POA: Diagnosis not present

## 2018-05-04 DIAGNOSIS — Z955 Presence of coronary angioplasty implant and graft: Secondary | ICD-10-CM

## 2018-05-04 DIAGNOSIS — R001 Bradycardia, unspecified: Secondary | ICD-10-CM | POA: Diagnosis not present

## 2018-05-04 DIAGNOSIS — E785 Hyperlipidemia, unspecified: Secondary | ICD-10-CM | POA: Diagnosis not present

## 2018-05-04 DIAGNOSIS — R778 Other specified abnormalities of plasma proteins: Secondary | ICD-10-CM

## 2018-05-04 DIAGNOSIS — I10 Essential (primary) hypertension: Secondary | ICD-10-CM | POA: Diagnosis present

## 2018-05-04 DIAGNOSIS — R7989 Other specified abnormal findings of blood chemistry: Secondary | ICD-10-CM

## 2018-05-04 DIAGNOSIS — I959 Hypotension, unspecified: Secondary | ICD-10-CM

## 2018-05-04 DIAGNOSIS — I358 Other nonrheumatic aortic valve disorders: Secondary | ICD-10-CM | POA: Diagnosis present

## 2018-05-04 DIAGNOSIS — R404 Transient alteration of awareness: Secondary | ICD-10-CM | POA: Diagnosis not present

## 2018-05-04 DIAGNOSIS — I214 Non-ST elevation (NSTEMI) myocardial infarction: Secondary | ICD-10-CM | POA: Diagnosis present

## 2018-05-04 DIAGNOSIS — Z7982 Long term (current) use of aspirin: Secondary | ICD-10-CM

## 2018-05-04 DIAGNOSIS — R9431 Abnormal electrocardiogram [ECG] [EKG]: Secondary | ICD-10-CM | POA: Diagnosis not present

## 2018-05-04 DIAGNOSIS — I25118 Atherosclerotic heart disease of native coronary artery with other forms of angina pectoris: Secondary | ICD-10-CM | POA: Diagnosis not present

## 2018-05-04 HISTORY — DX: Essential (primary) hypertension: I10

## 2018-05-04 HISTORY — DX: Hyperlipidemia, unspecified: E78.5

## 2018-05-04 HISTORY — DX: Atherosclerotic heart disease of native coronary artery without angina pectoris: I25.10

## 2018-05-04 HISTORY — DX: Malignant (primary) neoplasm, unspecified: C80.1

## 2018-05-04 HISTORY — DX: Unspecified osteoarthritis, unspecified site: M19.90

## 2018-05-04 HISTORY — DX: Unspecified macular degeneration: H35.30

## 2018-05-04 HISTORY — DX: Personal history of nicotine dependence: Z87.891

## 2018-05-04 LAB — CBC WITH DIFFERENTIAL/PLATELET
Abs Immature Granulocytes: 0.01 10*3/uL (ref 0.00–0.07)
Basophils Absolute: 0.1 10*3/uL (ref 0.0–0.1)
Basophils Relative: 1 %
EOS PCT: 3 %
Eosinophils Absolute: 0.2 10*3/uL (ref 0.0–0.5)
HEMATOCRIT: 40.6 % (ref 39.0–52.0)
HEMOGLOBIN: 13.8 g/dL (ref 13.0–17.0)
Immature Granulocytes: 0 %
LYMPHS ABS: 2.6 10*3/uL (ref 0.7–4.0)
LYMPHS PCT: 35 %
MCH: 33.7 pg (ref 26.0–34.0)
MCHC: 34 g/dL (ref 30.0–36.0)
MCV: 99 fL (ref 80.0–100.0)
MONO ABS: 0.9 10*3/uL (ref 0.1–1.0)
MONOS PCT: 12 %
Neutro Abs: 3.7 10*3/uL (ref 1.7–7.7)
Neutrophils Relative %: 49 %
Platelets: 186 10*3/uL (ref 150–400)
RBC: 4.1 MIL/uL — ABNORMAL LOW (ref 4.22–5.81)
RDW: 11.7 % (ref 11.5–15.5)
WBC: 7.5 10*3/uL (ref 4.0–10.5)
nRBC: 0 % (ref 0.0–0.2)

## 2018-05-04 LAB — COMPREHENSIVE METABOLIC PANEL
ALK PHOS: 31 U/L — AB (ref 38–126)
ALT: 16 U/L (ref 0–44)
AST: 27 U/L (ref 15–41)
Albumin: 2.9 g/dL — ABNORMAL LOW (ref 3.5–5.0)
Anion gap: 7 (ref 5–15)
BILIRUBIN TOTAL: 0.9 mg/dL (ref 0.3–1.2)
BUN: 16 mg/dL (ref 8–23)
CALCIUM: 8.6 mg/dL — AB (ref 8.9–10.3)
CO2: 23 mmol/L (ref 22–32)
CREATININE: 1.16 mg/dL (ref 0.61–1.24)
Chloride: 109 mmol/L (ref 98–111)
GFR calc non Af Amer: 57 mL/min — ABNORMAL LOW (ref >60)
GLUCOSE: 149 mg/dL — AB (ref 70–99)
Potassium: 3.6 mmol/L (ref 3.5–5.1)
Sodium: 139 mmol/L (ref 135–145)
TOTAL PROTEIN: 5.5 g/dL — AB (ref 6.5–8.1)

## 2018-05-04 LAB — HEMOGLOBIN A1C
Hgb A1c MFr Bld: 5.7 % — ABNORMAL HIGH (ref 4.8–5.6)
Mean Plasma Glucose: 116.89 mg/dL

## 2018-05-04 LAB — TSH: TSH: 1.748 u[IU]/mL (ref 0.350–4.500)

## 2018-05-04 LAB — I-STAT TROPONIN, ED: Troponin i, poc: 0.14 ng/mL (ref 0.00–0.08)

## 2018-05-04 LAB — MAGNESIUM: Magnesium: 1.9 mg/dL (ref 1.7–2.4)

## 2018-05-04 LAB — TROPONIN I
TROPONIN I: 0.26 ng/mL — AB (ref <0.03)
Troponin I: 0.13 ng/mL (ref <0.03)

## 2018-05-04 LAB — ECHOCARDIOGRAM COMPLETE

## 2018-05-04 MED ORDER — SODIUM CHLORIDE 0.9 % IV SOLN: INTRAVENOUS | Status: DC

## 2018-05-04 MED ORDER — ENOXAPARIN SODIUM 40 MG/0.4ML ~~LOC~~ SOLN: 40.0000 mg | SUBCUTANEOUS | Status: DC

## 2018-05-04 MED ORDER — MORPHINE SULFATE (PF) 2 MG/ML IV SOLN: 2.0000 mg | INTRAVENOUS | Status: DC | PRN

## 2018-05-04 MED ORDER — PROSIGHT PO TABS
1.0000 | ORAL_TABLET | Freq: Every day | ORAL | Status: DC
  Administered 2018-05-05 – 2018-05-06 (×2): 1 via ORAL
  Filled 2018-05-04 (×2): qty 1

## 2018-05-04 MED ORDER — BRIMONIDINE TARTRATE-TIMOLOL 0.2-0.5 % OP SOLN: 1.0000 [drp] | Freq: Two times a day (BID) | OPHTHALMIC | Status: DC

## 2018-05-04 MED ORDER — ASPIRIN 81 MG PO CHEW
81.0000 mg | CHEWABLE_TABLET | ORAL | Status: AC
  Administered 2018-05-05: 81 mg via ORAL
  Filled 2018-05-04: qty 1

## 2018-05-04 MED ORDER — SODIUM CHLORIDE 0.9 % IV SOLN: 250.0000 mL | INTRAVENOUS | Status: DC | PRN

## 2018-05-04 MED ORDER — ACETAMINOPHEN 325 MG PO TABS: 650.0000 mg | ORAL_TABLET | Freq: Four times a day (QID) | ORAL | Status: DC | PRN

## 2018-05-04 MED ORDER — SIMVASTATIN 40 MG PO TABS: 40.0000 mg | ORAL_TABLET | Freq: Every day | ORAL | Status: DC

## 2018-05-04 MED ORDER — HEPARIN (PORCINE) 25000 UT/250ML-% IV SOLN
900.0000 [IU]/h | INTRAVENOUS | Status: DC
  Administered 2018-05-04: 900 [IU]/h via INTRAVENOUS
  Filled 2018-05-04: qty 250

## 2018-05-04 MED ORDER — SODIUM CHLORIDE 0.45 % IV SOLN
INTRAVENOUS | Status: DC
  Administered 2018-05-04 – 2018-05-05 (×2): via INTRAVENOUS

## 2018-05-04 MED ORDER — TIMOLOL MALEATE 0.5 % OP SOLN
1.0000 [drp] | Freq: Two times a day (BID) | OPHTHALMIC | Status: DC
  Administered 2018-05-04 – 2018-05-06 (×4): 1 [drp] via OPHTHALMIC
  Filled 2018-05-04: qty 5

## 2018-05-04 MED ORDER — SODIUM CHLORIDE 0.9% FLUSH: 3.0000 mL | INTRAVENOUS | Status: DC | PRN

## 2018-05-04 MED ORDER — BENAZEPRIL HCL 40 MG PO TABS: 40.0000 mg | ORAL_TABLET | Freq: Every day | ORAL | Status: DC

## 2018-05-04 MED ORDER — ASPIRIN EC 81 MG PO TBEC
81.0000 mg | DELAYED_RELEASE_TABLET | Freq: Every day | ORAL | Status: DC
  Administered 2018-05-06: 09:00:00 81 mg via ORAL
  Filled 2018-05-04 (×2): qty 1

## 2018-05-04 MED ORDER — HEPARIN BOLUS VIA INFUSION
4000.0000 [IU] | Freq: Once | INTRAVENOUS | Status: AC
  Administered 2018-05-04: 4000 [IU] via INTRAVENOUS
  Filled 2018-05-04: qty 4000

## 2018-05-04 MED ORDER — ASPIRIN 81 MG PO CHEW
324.0000 mg | CHEWABLE_TABLET | Freq: Once | ORAL | Status: AC
  Administered 2018-05-04: 324 mg via ORAL
  Filled 2018-05-04: qty 4

## 2018-05-04 MED ORDER — ACETAMINOPHEN 650 MG RE SUPP: 650.0000 mg | Freq: Four times a day (QID) | RECTAL | Status: DC | PRN

## 2018-05-04 MED ORDER — SODIUM CHLORIDE 0.9% FLUSH: 3.0000 mL | Freq: Two times a day (BID) | INTRAVENOUS | Status: DC

## 2018-05-04 MED ORDER — OMEGA-3-ACID ETHYL ESTERS 1 G PO CAPS
1.0000 g | ORAL_CAPSULE | Freq: Two times a day (BID) | ORAL | Status: DC
  Administered 2018-05-04 – 2018-05-06 (×4): 1 g via ORAL
  Filled 2018-05-04 (×4): qty 1

## 2018-05-04 MED ORDER — LACTATED RINGERS IV SOLN
INTRAVENOUS | Status: DC
  Administered 2018-05-04: 16:00:00 via INTRAVENOUS

## 2018-05-04 MED ORDER — BRIMONIDINE TARTRATE 0.2 % OP SOLN
1.0000 [drp] | Freq: Two times a day (BID) | OPHTHALMIC | Status: DC
  Administered 2018-05-04 – 2018-05-06 (×4): 1 [drp] via OPHTHALMIC
  Filled 2018-05-04: qty 5

## 2018-05-04 MED ORDER — AMLODIPINE BESYLATE 5 MG PO TABS
5.0000 mg | ORAL_TABLET | Freq: Every day | ORAL | Status: DC
  Administered 2018-05-05 – 2018-05-06 (×2): 5 mg via ORAL
  Filled 2018-05-04 (×2): qty 1

## 2018-05-04 MED ORDER — MAGNESIUM OXIDE 400 (241.3 MG) MG PO TABS
400.0000 mg | ORAL_TABLET | Freq: Once | ORAL | Status: AC
  Administered 2018-05-04: 400 mg via ORAL
  Filled 2018-05-04: qty 1

## 2018-05-04 MED ORDER — POTASSIUM CHLORIDE CRYS ER 20 MEQ PO TBCR
40.0000 meq | EXTENDED_RELEASE_TABLET | Freq: Once | ORAL | Status: AC
  Administered 2018-05-04: 40 meq via ORAL
  Filled 2018-05-04: qty 2

## 2018-05-04 MED ORDER — SODIUM CHLORIDE 0.9 % IV BOLUS
1000.0000 mL | Freq: Once | INTRAVENOUS | Status: AC
  Administered 2018-05-04: 1000 mL via INTRAVENOUS

## 2018-05-04 MED ORDER — ATORVASTATIN CALCIUM 40 MG PO TABS
40.0000 mg | ORAL_TABLET | Freq: Every day | ORAL | Status: DC
  Filled 2018-05-04: qty 1

## 2018-05-04 MED ORDER — PRESERVISION AREDS 2 PO CAPS: 1.0000 | ORAL_CAPSULE | Freq: Every day | ORAL | Status: DC

## 2018-05-04 MED ORDER — SODIUM CHLORIDE 0.9% FLUSH
3.0000 mL | Freq: Two times a day (BID) | INTRAVENOUS | Status: DC
  Administered 2018-05-05: 3 mL via INTRAVENOUS

## 2018-05-04 NOTE — H&P (View-Only) (Signed)
Cardiology Consultation:   Patient ID: Yazen Rosko; [id number]; 1934-04-08   Admit date: 05/04/2018 Date of Consult: 05/04/2018  Primary Care Provider: Reynold Bowen, MD Primary Cardiologist: Sanda Klein, MD Primary Electrophysiologist:  None  Chief Complaint: passed out  Patient Profile:   Carlus Stay is a 84 y.o. active male with HTN, HLD, former tobacco abuse (quit 1970s), macular degeneration who is being seen today for the evaluation of syncope, elevated troponin and prolonged QT at the request of Dr. Lorin Mercy.  History of Present Illness:   Gianlucas Gene Fornes considers himself to be very healthy, stating, "age isn't anything but a number." He denies any prior cardiac hx. He and his wife exercise regularly except recently took a hiatus due to her having eye surgery. He was in [place] this AM and went through his usual 20 minute routine of various exercises. He had no cardiac symptoms while doing so. While standing he felt lightheaded so told his wife he needed to sit down. While seated he believes he passed out for just a few seconds. The H/P indicates this was for about 5 minutes but the patient denies this now that he's had some time to process the event. He states he completely lost consciousness for a few seconds but when he came to he was out of it - still able to hear people talking to him but couldn't fully comprehend what was going on. No b/b incontinence or seizure activity reported. He didn't remember being placed in the ambulance. Wife pressed their medic alert button to summon EMS. There is no mention in ER notes or H/P about any arrhythmia or CPR needed upon their arrival. Per notes, wife had reported he was cool, pale and diaphoretic prior to syncope. His initial BP was 85/58 without radial pulses. He received 700cc NS en route and an additional 1059mL bolus here. His BP was soft in the ER but has since come up to 097D-532D systolic. He denies any recent  similar episodes and currently feels fine except feels like he cannot empty his bladder in its entirety (nurse made aware to notify IM). EKG shows NSR with biphasic appearing TW V2, TWI I, II, aVL, V4-V6 with prolonged QT of 563ms without prior to compare to. I asked Dr. Sallyanne Kuster to come by while tech was doing echo - he feels EF is preserved but preliminarily there is a wall motion abnormality, he appears dry, and has mildly elevated pulm pressure without significant valvular disease. His HR does increase to the 1teens sitting up and standing, indicative of orthostasis, although not presently reporting symptoms. No obvious precipitants of volume loss otherwise. No fam hx of SCD, syncope or heart disease of any kind.  Past Medical History:  Diagnosis Date  . Former tobacco use   . Hyperlipidemia   . Hypertension   . Macular degeneration     Past Surgical History:  Procedure Laterality Date  . HERNIA REPAIR       Inpatient Medications: Scheduled Meds: . [START ON 05/05/2018] amLODipine  5 mg Oral Daily  . [START ON 05/05/2018] aspirin EC  81 mg Oral Daily  . atorvastatin  40 mg Oral q1800  . [START ON 05/05/2018] benazepril  40 mg Oral Daily  . brimonidine  1 drop Both Eyes BID   And  . timolol  1 drop Both Eyes BID  . enoxaparin (LOVENOX) injection  40 mg Subcutaneous Q24H  . [START ON 05/05/2018] multivitamin  1 tablet Oral Daily  . omega-3  acid ethyl esters  1 g Oral BID  . sodium chloride flush  3 mL Intravenous Q12H   Continuous Infusions: . lactated ringers     PRN Meds: acetaminophen **OR** acetaminophen, morphine injection  Home Meds: Prior to Admission medications   Medication Sig Start Date End Date Taking? Authorizing Provider  amLODipine (NORVASC) 5 MG tablet Take 5 mg by mouth daily. 02/12/18  Yes [provider]  aspirin EC 81 MG tablet Take 81 mg by mouth daily.   Yes [provider]  benazepril (LOTENSIN) 40 MG tablet Take 40 mg by mouth daily.  02/12/18  Yes [provider]  calcium carbonate (OSCAL) 1500 (600 Ca) MG TABS tablet Take 600 mg of elemental calcium by mouth daily.   Yes [provider]  COMBIGAN 0.2-0.5 % ophthalmic solution Place 1 drop into both eyes 2 (two) times daily. 04/18/18  Yes [provider]  Multiple Vitamin (MULTIVITAMIN WITH MINERALS) TABS tablet Take 1 tablet by mouth daily.   Yes [provider]  Multiple Vitamins-Minerals (PRESERVISION AREDS 2) CAPS Take 1 capsule by mouth daily.   Yes [provider]  omega-3 acid ethyl esters (LOVAZA) 1 g capsule Take 1 g by mouth 2 (two) times daily.   Yes [provider]  simvastatin (ZOCOR) 40 MG tablet Take 40 mg by mouth at bedtime. 04/01/18  Yes [provider]    Allergies:   No Known Allergies  Social History:   Social History   Socioeconomic History  . Marital status: Unknown    Spouse name: Not on file  . Number of children: Not on file  . Years of education: Not on file  . Highest education level: Not on file  Occupational History  . Occupation: retired  Scientific laboratory technician  . Financial resource strain: Not on file  . Food insecurity:    Worry: Not on file    Inability: Not on file  . Transportation needs:    Medical: Not on file    Non-medical: Not on file  Tobacco Use  . Smoking status: Former Smoker    Years: 15.00    Types: Cigarettes    Last attempt to quit: 1976    Years since quitting: 43.9  . Smokeless tobacco: Never Used  Substance and Sexual Activity  . Alcohol use: Never    Frequency: Never  . Drug use: Never  . Sexual activity: Not on file  Lifestyle  . Physical activity:    Days per week: Not on file    Minutes per session: Not on file  . Stress: Not on file  Relationships  . Social connections:    Talks on phone: Not on file    Gets together: Not on file    Attends religious service: Not on file    Active member of club or organization: Not on file    Attends  meetings of clubs or organizations: Not on file    Relationship status: Not on file  . Intimate partner violence:    Fear of current or ex partner: Not on file    Emotionally abused: Not on file    Physically abused: Not on file    Forced sexual activity: Not on file  Other Topics Concern  . Not on file  Social History Narrative  . Not on file    Family History:   The patient's family history is negative for CAD.  ROS:  Please see the history of present illness.  All other ROS  reviewed and negative.     Physical Exam/Data:   Vitals:   05/04/18 1100 05/04/18 1130 05/04/18 1200 05/04/18 1403  BP: 133/85 (!) 152/74 (!) 147/77 (!) 156/102  Pulse: (!) 40 (!) 52 (!) 54 74  Resp: (!) 9 15 19 18   Temp:    (!) 97.5 F (36.4 C)  TempSrc:    Oral  SpO2: 94% 97% 98% 97%   No intake or output data in the 24 hours ending 05/04/18 1609 There were no vitals filed for this visit. There is no height or weight on file to calculate BMI.  General: Well developed, well nourished WM, in no acute distress. Lying flat in bed without SOB Head: Normocephalic, atraumatic, sclera non-icteric, no xanthomas, nares are without discharge Neck: Negative for carotid bruits. JVD not elevated. Lungs: Clear bilaterally to auscultation without wheezes, rales, or rhonchi. Breathing is unlabored. Heart: RRR with S1 S2. No murmurs, rubs, or gallops appreciated. Abdomen: Soft, non-tender, non-distended with normoactive bowel sounds. No hepatomegaly. No rebound/guarding. No obvious abdominal masses. Msk:  Strength and tone appear normal for age. Extremities: No clubbing or cyanosis. No edema.  Distal pedal pulses are 2+ and equal bilaterally. Neuro: Alert and oriented X 3. No facial asymmetry. No focal deficit. Moves all extremities spontaneously. Psych:  Responds to questions appropriately with a normal affect.  EKG:  The EKG was personally reviewed and demonstrates NSR with biphasic appearing TW V2, TWI I, II,  aVL, V4-V6 with prolonged QT of 530ms without prior to compare to.   Relevant CV Studies: None  Laboratory Data:  Chemistry Recent Labs  Lab 05/04/18 0738  NA 139  K 3.6  CL 109  CO2 23  GLUCOSE 149*  BUN 16  CREATININE 1.16  CALCIUM 8.6*  GFRNONAA 57*  GFRAA >60  ANIONGAP 7    Recent Labs  Lab 05/04/18 0738  PROT 5.5*  ALBUMIN 2.9*  AST 27  ALT 16  ALKPHOS 31*  BILITOT 0.9   Hematology Recent Labs  Lab 05/04/18 0738  WBC 7.5  RBC 4.10*  HGB 13.8  HCT 40.6  MCV 99.0  MCH 33.7  MCHC 34.0  RDW 11.7  PLT 186   Cardiac Enzymes Recent Labs  Lab 05/04/18 1419  TROPONINI 0.13*    Recent Labs  Lab 05/04/18 0831  TROPIPOC 0.14*    BNPNo results for input(s): BNP, PROBNP in the last 168 hours.  DDimer No results for input(s): DDIMER in the last 168 hours.  Radiology/Studies:  Dg Chest 2 View  Result Date: 05/04/2018 CLINICAL DATA:  Syncopal episode while exercising. EXAM: CHEST - 2 VIEW COMPARISON:  None. FINDINGS: The cardiomediastinal silhouette is within normal limits. The lungs are normally to mildly hyperinflated. The interstitial markings are coarsened with increased markings noted peripherally in both lungs. No airspace consolidation, overt pulmonary edema, pleural effusion, or pneumothorax is identified. No acute osseous abnormality is seen. IMPRESSION: 1. Increased interstitial markings bilaterally suspicious for underlying chronic interstitial lung disease. 2. No consolidation or overt edema. Electronically Signed   By: Logan Bores M.D.   On: 05/04/2018 09:25    Assessment and Plan:   1. Syncope with elevated troponin and prolonged QTc - EKG and QTc are concerning for ischemia, although no clear cut anginal warning symptoms prior to event. Case was discussed with Dr. Sallyanne Kuster who recommends initiation of IV Heparin, continuing to cycle troponins, and planning definitive cath in AM given findings and wall motion abnormality. He appears dry on echo  so will  hydrate overnight. Risks and benefits of cardiac catheterization have been discussed with the patient.  These include bleeding, infection, kidney damage, stroke, heart attack, death.  The patient understands these risks and is willing to proceed. Scheduled as first case with Dr. Claiborne Billings in AM. If initial workup is unrevealing otherwise, would need to consider ambulatory monitoring such as implantable loop recorder. I did discuss DMV recommendation of no driving x 6 months with patient. Given QT prolongation will give 23meq KCL and Mag Ox 400mg  x 1 now and repeat lytes in AM (goal K 4.0+, Mg 2.0+).  2. Sinus bradycardia - TSH wnl. Non on BB. Follow on telemetry. Mildly tachycardic upon standing.  3. HTN with hypotension this admission - BP improved after hydration. Will hold ACE given plan for cath - can follow BP with amlodipine on board.  4. Hyperlipidemia - max dose of Zocor while on amlodipine is 20mg  daily. Will switch Zocor to atorvastatin. Lipid panel ordered for AM.  For questions or updates, please contact [place] Please consult [email or web address] for contact info under Cardiology/STEMI.    Signed, Charlie Pitter, PA-C  05/04/2018 4:09 PM  I have seen and examined the patient along with Charlie Pitter, PA-C .  I have reviewed the chart, notes and new data.  I agree with PA/NP's note.  Key new complaints: presentation with (near) syncope, no angina. Occurred following exercise - no symptoms during exercise. Key examination changes: normal CV exam. No arrhythmia on telemetry so far. Key new findings / data: mild increase in troponin, prominent anterior T wave inversion and long QT, small anteroapical area of hypokinesis seen on echo (I disagree with the official interpretation of "normal wall motion", but I agree that overall systolic function is normal.  PLAN: Most likely arrhythmia related to anteroapical ischemia due to an unstable mid-LAD plaque. Less likely to represent  takotsubo syndrome (no clear trigger). Only way to distinguish is coronary angiography, scheduled in AM with Dr. Claiborne Billings. This procedure has been fully reviewed with the patient and written informed consent has been obtained. If no culprit abnormality is identified, recommend implantable loop recorder to evaluate for arrhythmia, prior to discharge.  Sanda Klein, MD, [place] [phone] 05/04/2018, 4:53 PM

## 2018-05-04 NOTE — Progress Notes (Addendum)
Paged Dr Lorin Mercy for urine retention. She advised to monitor I/O. She gave a VO for a foley only if absolutely necessary.

## 2018-05-04 NOTE — Progress Notes (Signed)
  Echocardiogram 2D Echocardiogram has been performed.  Kenneth Pope 05/04/2018, 4:06 PM

## 2018-05-04 NOTE — Progress Notes (Signed)
Pt continues to have the need to urinate. I obtained a bladder scanner from another unit. He has 725ml of retained urine. Earlier Dr. Lorin Mercy gave a verbal order for a foley if necessary.

## 2018-05-04 NOTE — Progress Notes (Signed)
ANTICOAGULATION CONSULT NOTE - Initial Consult  Pharmacy Consult for heparin Indication: chest pain/ACS  No Known Allergies  Patient Measurements: Height: 5\' 8"  (172.7 cm) Weight: 166 lb (75.3 kg) IBW/kg (Calculated) : 68.4  Vital Signs: Temp: 97.5 F (36.4 C) (11/18 1403) Temp Source: Oral (11/18 1403) BP: 156/102 (11/18 1403) Pulse Rate: 74 (11/18 1403)  Labs: Recent Labs    05/04/18 0738 05/04/18 1419  HGB 13.8  --   HCT 40.6  --   PLT 186  --   CREATININE 1.16  --   TROPONINI  --  0.13*    Estimated Creatinine Clearance: 47.5 mL/min (by C-G formula based on SCr of 1.16 mg/dL).   Medical History: Past Medical History:  Diagnosis Date  . Former tobacco use   . Hyperlipidemia   . Hypertension   . Macular degeneration     Medications:  Medications Prior to Admission  Medication Sig Dispense Refill Last Dose  . amLODipine (NORVASC) 5 MG tablet Take 5 mg by mouth daily.  3 05/04/2018 at Unknown time  . aspirin EC 81 MG tablet Take 81 mg by mouth daily.   05/04/2018 at Unknown time  . benazepril (LOTENSIN) 40 MG tablet Take 40 mg by mouth daily.  3 05/04/2018 at Unknown time  . calcium carbonate (OSCAL) 1500 (600 Ca) MG TABS tablet Take 600 mg of elemental calcium by mouth daily.   05/04/2018 at Unknown time  . COMBIGAN 0.2-0.5 % ophthalmic solution Place 1 drop into both eyes 2 (two) times daily.  3 05/04/2018 at Unknown time  . Multiple Vitamin (MULTIVITAMIN WITH MINERALS) TABS tablet Take 1 tablet by mouth daily.   05/04/2018 at Unknown time  . Multiple Vitamins-Minerals (PRESERVISION AREDS 2) CAPS Take 1 capsule by mouth daily.   05/04/2018 at Unknown time  . omega-3 acid ethyl esters (LOVAZA) 1 g capsule Take 1 g by mouth 2 (two) times daily.   05/03/2018 at Unknown time  . simvastatin (ZOCOR) 40 MG tablet Take 40 mg by mouth at bedtime.  2 05/03/2018 at Unknown time    Assessment: 84 y/o male with syncopal episode after working out today. Troponin is  elevated at 0.14. Pharmacy consulted to begin IV heparin. No bleeding noted, CBC is normal.  Goal of Therapy:  Heparin level 0.3-0.7 units/ml Monitor platelets by anticoagulation protocol: Yes   Plan:  Discontinue Lovenox for VTE prophylaxis Heparin 4000 units IV bolus then infuse at 900 units/hr 8 hr heparin level Daily heparin level and CBC Monitor for s/sx of bleeding   Renold Genta, PharmD, BCPS Clinical Pharmacist Clinical phone for 05/04/2018 until 10p is [phone] 05/04/2018 4:13 PM  **Pharmacist phone directory can now be found on [email or web address] listed under [place]**

## 2018-05-04 NOTE — ED Provider Notes (Signed)
[facility] EMERGENCY DEPARTMENT Provider Note   CSN: [id number] Arrival date & time: 05/04/18  1324     History   Chief Complaint Chief Complaint  Patient presents with  . Loss of Consciousness    HPI Kenneth Pope is a 83 y.o. male who presents the emergency department with complaint of syncope.  He is attended by his wife who is at bedside.  History is provided by both.  They live in a retirement community together.  This morning they decided to go back to the exercise facility.  They have been not working out for the past 2 weeks because the patient's wife had a recent eye surgery.  This morning the patient did his regular routine of exercise on the [employer] machines.  When they had both finished he got up to go get a white for the machines but suddenly felt very dizzy and lightheaded and had to sit down.  His wife came by his side and states that he began to slump over, became diaphoretic and she was having them hold him up in the seat.  She pushed an alert button and the nurses came by his side and 911 was called.  She says that he was unconscious for about 5 to 10 minutes in totality until EMS arrived.  She says that he had several episodes of coughing but was still unconscious.  The patient denies chest pain,'s recent changes in medication, skipping or racing in his heart.  Patient states that other than hypertension which is generally well controlled he is "fit as a fiddle."  He has no known history of blood clots or pulmonary emboli.  Upon arrival the patient was noted to be hypotensive.  HPI  Past Medical History:  Diagnosis Date  . Hypertension     There are no active problems to display for this patient.   Past Surgical History:  Procedure Laterality Date  . HERNIA REPAIR          Home Medications    Prior to Admission medications   Medication Sig Start Date End Date Taking? Authorizing Provider  amLODipine (NORVASC) 5 MG tablet Take 5  mg by mouth daily. 02/12/18  Yes [provider]  aspirin EC 81 MG tablet Take 81 mg by mouth daily.   Yes [provider]  benazepril (LOTENSIN) 40 MG tablet Take 40 mg by mouth daily. 02/12/18  Yes [provider]  calcium carbonate (OSCAL) 1500 (600 Ca) MG TABS tablet Take 600 mg of elemental calcium by mouth daily.   Yes [provider]  COMBIGAN 0.2-0.5 % ophthalmic solution Place 1 drop into both eyes 2 (two) times daily. 04/18/18  Yes [provider]  Multiple Vitamin (MULTIVITAMIN WITH MINERALS) TABS tablet Take 1 tablet by mouth daily.   Yes [provider]  Multiple Vitamins-Minerals (PRESERVISION AREDS 2) CAPS Take 1 capsule by mouth daily.   Yes [provider]  omega-3 acid ethyl esters (LOVAZA) 1 g capsule Take 1 g by mouth 2 (two) times daily.   Yes [provider]  simvastatin (ZOCOR) 40 MG tablet Take 40 mg by mouth at bedtime. 04/01/18  Yes [provider]    Family History History reviewed. No pertinent family history.  Social History Social History   Tobacco Use  . Smoking status: Former Smoker    Types: Cigarettes  . Smokeless tobacco: Never Used  Substance Use Topics  . Alcohol use: Never    Frequency: Never  .  Drug use: Not on file     Allergies   Patient has no known allergies.   Review of Systems Review of Systems  Ten systems reviewed and are negative for acute change, except as noted in the HPI.   Physical Exam Updated Vital Signs BP 110/77   Pulse (!) 51   Temp (!) 97.3 F (36.3 C) (Oral)   Resp 20   SpO2 98%   Physical Exam  Constitutional: He appears well-developed and well-nourished. No distress.  HENT:  Head: Normocephalic and atraumatic.  Eyes: Conjunctivae are normal. No scleral icterus.  Neck: Normal range of motion. Neck supple.  Cardiovascular: Normal rate, regular rhythm, normal heart sounds and intact distal pulses.  Pulmonary/Chest: Effort normal  and breath sounds normal. No respiratory distress.  Abdominal: Soft. He exhibits no distension and no mass. There is no tenderness.  Musculoskeletal: He exhibits no edema.  Neurological: He is alert.  Skin: Skin is warm and dry. He is not diaphoretic.  Psychiatric: His behavior is normal.  Nursing note and vitals reviewed.    ED Treatments / Results  Labs (all labs ordered are listed, but only abnormal results are displayed) Labs Reviewed  CBC WITH DIFFERENTIAL/PLATELET - Abnormal; Notable for the following components:      Result Value   RBC 4.10 (*)    All other components within normal limits  I-STAT TROPONIN, ED - Abnormal; Notable for the following components:   Troponin i, poc 0.14 (*)    All other components within normal limits  COMPREHENSIVE METABOLIC PANEL  URINALYSIS, ROUTINE W REFLEX MICROSCOPIC  MAGNESIUM  CBG MONITORING, ED    EKG EKG Interpretation  Date/Time:  Monday May 04 2018 07:34:09 EST Ventricular Rate:  59 PR Interval:    QRS Duration: 112 QT Interval:  513 QTC Calculation: 509 R Axis:   -3 Text Interpretation:  Sinus rhythm Atrial premature complexes Borderline intraventricular conduction delay Abnormal T, consider ischemia, diffuse leads Prolonged QT interval Since prior ECG, TW inversions new in lateral leads Confirmed by Gareth Morgan [phone]) on 05/04/2018 8:46:48 AM   Radiology No results found.  Procedures .Critical Care Performed by: Margarita Mail, PA-C Authorized by: Margarita Mail, PA-C   Critical care provider statement:    Critical care time (minutes):  45   Critical care was necessary to treat or prevent imminent or life-threatening deterioration of the following conditions:  Cardiac failure   Critical care was time spent personally by me on the following activities:  Discussions with consultants, evaluation of patient's response to treatment, examination of patient, ordering and performing treatments and interventions,  ordering and review of laboratory studies, ordering and review of radiographic studies, pulse oximetry, re-evaluation of patient's condition, obtaining history from patient or surrogate and review of old charts   (including critical care time)  Medications Ordered in ED Medications  sodium chloride 0.9 % bolus 1,000 mL (1,000 mLs Intravenous New Bag/Given 05/04/18 0831)    And  0.9 %  sodium chloride infusion (has no administration in time range)  aspirin chewable tablet 324 mg (has no administration in time range)     Initial Impression / Assessment and Plan / ED Course  I have reviewed the triage vital signs and the nursing notes.  Pertinent labs & imaging results that were available during my care of the patient were reviewed by me and considered in my medical decision making (see chart for details).  Clinical Course as of May 05 911  [facility] May 04, 2018  0845 Troponin i, poc(!!): 0.14 [AH]    Clinical Course User Index [AH] Margarita Mail, PA-C    Patient here with c/o syncope. The differential for syncope is extensive and includes, but is not limited to: arrythmia (Vtach, SVT, SSS, sinus arrest, AV block, bradycardia) aortic stenosis, AMI, HOCM, PE, atrial myxoma, pulmonary hypertension, orthostatic hypotension, (hypovolemia, drug effect, GB syndrome, micturition, cough, swall) carotid sinus sensitivity, Seizure, TIA/CVA, hypoglycemia,  Vertigo, hemorrhage.  The patient here unfortunately has concerning EKG showing QT prolongation and new T wave inversions.  He also has an elevated troponin level.  Patient's chest x-ray shows some increased interstitial markings concerning for chronic interstitial lung disease.  Patient has no return of syncopal symptoms, no bradycardia, he was very hypotensive and his blood pressure has improved with fluid rehydration.  Patient given chewable aspirin.  He has been chest pain-free throughout the entire event.  I have spoken with cardiology who will  consult on the patient.  The patient will be admitted to the hospitalist service.  I have low suspicion for pulmonary embolus as the cause of his symptoms today.   Final Clinical Impressions(s) / ED Diagnoses   Final diagnoses:  Syncope, unspecified syncope type  Elevated troponin  Prolonged QT interval  Hypotension, unspecified hypotension type    ED Discharge Orders    None       Margarita Mail, PA-C 05/04/18 1544    Gareth Morgan, MD 05/06/18 [phone]

## 2018-05-04 NOTE — ED Notes (Signed)
Dr Lorin Mercy at bedside

## 2018-05-04 NOTE — Progress Notes (Signed)
Called pharm to verify fluid compatibility with heparin. Pharm said only half normal saline can run with heparin. Dr. Lorin Mercy d/c LR. Notified Dr. Lorin Mercy and received a verbal order for half normal saline. Will d/c order for NS.

## 2018-05-04 NOTE — Progress Notes (Signed)
Notified Dr Lorin Mercy of increased trop .[place]

## 2018-05-04 NOTE — Consult Note (Addendum)
Cardiology Consultation:   Patient ID: Kenneth Pope; [id number]; [birth year]   Admit date: 05/04/2018 Date of Consult: 05/04/2018  Primary Care Provider: Reynold Bowen, MD Primary Cardiologist: Sanda Klein, MD Primary Electrophysiologist:  None  Chief Complaint: passed out  Patient Profile:   Kenneth Pope is a 84 y.o. active male with HTN, HLD, former tobacco abuse (quit 1970s), macular degeneration who is being seen today for the evaluation of syncope, elevated troponin and prolonged QT at the request of Dr. Lorin Mercy.  History of Present Illness:   Wilkins Gene Payne considers himself to be very healthy, stating, "age isn't anything but a number." He denies any prior cardiac hx. He and his wife exercise regularly except recently took a hiatus due to her having eye surgery. He was in [place] this AM and went through his usual 20 minute routine of various exercises. He had no cardiac symptoms while doing so. While standing he felt lightheaded so told his wife he needed to sit down. While seated he believes he passed out for just a few seconds. The H/P indicates this was for about 5 minutes but the patient denies this now that he's had some time to process the event. He states he completely lost consciousness for a few seconds but when he came to he was out of it - still able to hear people talking to him but couldn't fully comprehend what was going on. No b/b incontinence or seizure activity reported. He didn't remember being placed in the ambulance. Wife pressed their medic alert button to summon EMS. There is no mention in ER notes or H/P about any arrhythmia or CPR needed upon their arrival. Per notes, wife had reported he was cool, pale and diaphoretic prior to syncope. His initial BP was 85/58 without radial pulses. He received 700cc NS en route and an additional 1066mL bolus here. His BP was soft in the ER but has since come up to [id number] systolic. He denies any recent  similar episodes and currently feels fine except feels like he cannot empty his bladder in its entirety (nurse made aware to notify IM). EKG shows NSR with biphasic appearing TW V2, TWI I, II, aVL, V4-V6 with prolonged QT of 570ms without prior to compare to. I asked Dr. Sallyanne Kuster to come by while tech was doing echo - he feels EF is preserved but preliminarily there is a wall motion abnormality, he appears dry, and has mildly elevated pulm pressure without significant valvular disease. His HR does increase to the 1teens sitting up and standing, indicative of orthostasis, although not presently reporting symptoms. No obvious precipitants of volume loss otherwise. No fam hx of SCD, syncope or heart disease of any kind.  Past Medical History:  Diagnosis Date  . Former tobacco use   . Hyperlipidemia   . Hypertension   . Macular degeneration     Past Surgical History:  Procedure Laterality Date  . HERNIA REPAIR       Inpatient Medications: Scheduled Meds: . [START ON 05/05/2018] amLODipine  5 mg Oral Daily  . [START ON 05/05/2018] aspirin EC  81 mg Oral Daily  . atorvastatin  40 mg Oral q1800  . [START ON 05/05/2018] benazepril  40 mg Oral Daily  . brimonidine  1 drop Both Eyes BID   And  . timolol  1 drop Both Eyes BID  . enoxaparin (LOVENOX) injection  40 mg Subcutaneous Q24H  . [START ON 05/05/2018] multivitamin  1 tablet Oral Daily  . omega-3  acid ethyl esters  1 g Oral BID  . sodium chloride flush  3 mL Intravenous Q12H   Continuous Infusions: . lactated ringers     PRN Meds: acetaminophen **OR** acetaminophen, morphine injection  Home Meds: Prior to Admission medications   Medication Sig Start Date End Date Taking? Authorizing Provider  amLODipine (NORVASC) 5 MG tablet Take 5 mg by mouth daily. 02/12/18  Yes [provider]  aspirin EC 81 MG tablet Take 81 mg by mouth daily.   Yes [provider]  benazepril (LOTENSIN) 40 MG tablet Take 40 mg by mouth daily.  02/12/18  Yes [provider]  calcium carbonate (OSCAL) 1500 (600 Ca) MG TABS tablet Take 600 mg of elemental calcium by mouth daily.   Yes [provider]  COMBIGAN 0.2-0.5 % ophthalmic solution Place 1 drop into both eyes 2 (two) times daily. 04/18/18  Yes [provider]  Multiple Vitamin (MULTIVITAMIN WITH MINERALS) TABS tablet Take 1 tablet by mouth daily.   Yes [provider]  Multiple Vitamins-Minerals (PRESERVISION AREDS 2) CAPS Take 1 capsule by mouth daily.   Yes [provider]  omega-3 acid ethyl esters (LOVAZA) 1 g capsule Take 1 g by mouth 2 (two) times daily.   Yes [provider]  simvastatin (ZOCOR) 40 MG tablet Take 40 mg by mouth at bedtime. 04/01/18  Yes [provider]    Allergies:   No Known Allergies  Social History:   Social History   Socioeconomic History  . Marital status: Unknown    Spouse name: Not on file  . Number of children: Not on file  . Years of education: Not on file  . Highest education level: Not on file  Occupational History  . Occupation: retired  Scientific laboratory technician  . Financial resource strain: Not on file  . Food insecurity:    Worry: Not on file    Inability: Not on file  . Transportation needs:    Medical: Not on file    Non-medical: Not on file  Tobacco Use  . Smoking status: Former Smoker    Years: 15.00    Types: Cigarettes    Last attempt to quit: 1976    Years since quitting: 43.9  . Smokeless tobacco: Never Used  Substance and Sexual Activity  . Alcohol use: Never    Frequency: Never  . Drug use: Never  . Sexual activity: Not on file  Lifestyle  . Physical activity:    Days per week: Not on file    Minutes per session: Not on file  . Stress: Not on file  Relationships  . Social connections:    Talks on phone: Not on file    Gets together: Not on file    Attends religious service: Not on file    Active member of club or organization: Not on file    Attends  meetings of clubs or organizations: Not on file    Relationship status: Not on file  . Intimate partner violence:    Fear of current or ex partner: Not on file    Emotionally abused: Not on file    Physically abused: Not on file    Forced sexual activity: Not on file  Other Topics Concern  . Not on file  Social History Narrative  . Not on file    Family History:   The patient's family history is negative for CAD.  ROS:  Please see the history of present illness.  All other ROS  reviewed and negative.     Physical Exam/Data:   Vitals:   05/04/18 1100 05/04/18 1130 05/04/18 1200 05/04/18 1403  BP: 133/85 (!) 152/74 (!) 147/77 (!) 156/102  Pulse: (!) 40 (!) 52 (!) 54 74  Resp: (!) 9 15 19 18   Temp:    (!) 97.5 F (36.4 C)  TempSrc:    Oral  SpO2: 94% 97% 98% 97%   No intake or output data in the 24 hours ending 05/04/18 1609 There were no vitals filed for this visit. There is no height or weight on file to calculate BMI.  General: Well developed, well nourished WM, in no acute distress. Lying flat in bed without SOB Head: Normocephalic, atraumatic, sclera non-icteric, no xanthomas, nares are without discharge Neck: Negative for carotid bruits. JVD not elevated. Lungs: Clear bilaterally to auscultation without wheezes, rales, or rhonchi. Breathing is unlabored. Heart: RRR with S1 S2. No murmurs, rubs, or gallops appreciated. Abdomen: Soft, non-tender, non-distended with normoactive bowel sounds. No hepatomegaly. No rebound/guarding. No obvious abdominal masses. Msk:  Strength and tone appear normal for age. Extremities: No clubbing or cyanosis. No edema.  Distal pedal pulses are 2+ and equal bilaterally. Neuro: Alert and oriented X 3. No facial asymmetry. No focal deficit. Moves all extremities spontaneously. Psych:  Responds to questions appropriately with a normal affect.  EKG:  The EKG was personally reviewed and demonstrates NSR with biphasic appearing TW V2, TWI I, II,  aVL, V4-V6 with prolonged QT of 529ms without prior to compare to.   Relevant CV Studies: None  Laboratory Data:  Chemistry Recent Labs  Lab 05/04/18 0738  NA 139  K 3.6  CL 109  CO2 23  GLUCOSE 149*  BUN 16  CREATININE 1.16  CALCIUM 8.6*  GFRNONAA 57*  GFRAA >60  ANIONGAP 7    Recent Labs  Lab 05/04/18 0738  PROT 5.5*  ALBUMIN 2.9*  AST 27  ALT 16  ALKPHOS 31*  BILITOT 0.9   Hematology Recent Labs  Lab 05/04/18 0738  WBC 7.5  RBC 4.10*  HGB 13.8  HCT 40.6  MCV 99.0  MCH 33.7  MCHC 34.0  RDW 11.7  PLT 186   Cardiac Enzymes Recent Labs  Lab 05/04/18 1419  TROPONINI 0.13*    Recent Labs  Lab 05/04/18 0831  TROPIPOC 0.14*    BNPNo results for input(s): BNP, PROBNP in the last 168 hours.  DDimer No results for input(s): DDIMER in the last 168 hours.  Radiology/Studies:  Dg Chest 2 View  Result Date: 05/04/2018 CLINICAL DATA:  Syncopal episode while exercising. EXAM: CHEST - 2 VIEW COMPARISON:  None. FINDINGS: The cardiomediastinal silhouette is within normal limits. The lungs are normally to mildly hyperinflated. The interstitial markings are coarsened with increased markings noted peripherally in both lungs. No airspace consolidation, overt pulmonary edema, pleural effusion, or pneumothorax is identified. No acute osseous abnormality is seen. IMPRESSION: 1. Increased interstitial markings bilaterally suspicious for underlying chronic interstitial lung disease. 2. No consolidation or overt edema. Electronically Signed   By: Logan Bores M.D.   On: 05/04/2018 09:25    Assessment and Plan:   1. Syncope with elevated troponin and prolonged QTc - EKG and QTc are concerning for ischemia, although no clear cut anginal warning symptoms prior to event. Case was discussed with Dr. Sallyanne Kuster who recommends initiation of IV Heparin, continuing to cycle troponins, and planning definitive cath in AM given findings and wall motion abnormality. He appears dry on echo  so will  hydrate overnight. Risks and benefits of cardiac catheterization have been discussed with the patient.  These include bleeding, infection, kidney damage, stroke, heart attack, death.  The patient understands these risks and is willing to proceed. Scheduled as first case with Dr. Claiborne Billings in AM. If initial workup is unrevealing otherwise, would need to consider ambulatory monitoring such as implantable loop recorder. I did discuss DMV recommendation of no driving x 6 months with patient. Given QT prolongation will give 49meq KCL and Mag Ox 400mg  x 1 now and repeat lytes in AM (goal K 4.0+, Mg 2.0+).  2. Sinus bradycardia - TSH wnl. Non on BB. Follow on telemetry. Mildly tachycardic upon standing.  3. HTN with hypotension this admission - BP improved after hydration. Will hold ACE given plan for cath - can follow BP with amlodipine on board.  4. Hyperlipidemia - max dose of Zocor while on amlodipine is 20mg  daily. Will switch Zocor to atorvastatin. Lipid panel ordered for AM.  For questions or updates, please contact [place] Please consult [email or web address] for contact info under Cardiology/STEMI.    Signed, Charlie Pitter, PA-C  05/04/2018 4:09 PM  I have seen and examined the patient along with Charlie Pitter, PA-C .  I have reviewed the chart, notes and new data.  I agree with PA/NP's note.  Key new complaints: presentation with (near) syncope, no angina. Occurred following exercise - no symptoms during exercise. Key examination changes: normal CV exam. No arrhythmia on telemetry so far. Key new findings / data: mild increase in troponin, prominent anterior T wave inversion and long QT, small anteroapical area of hypokinesis seen on echo (I disagree with the official interpretation of "normal wall motion", but I agree that overall systolic function is normal.  PLAN: Most likely arrhythmia related to anteroapical ischemia due to an unstable mid-LAD plaque. Less likely to represent  takotsubo syndrome (no clear trigger). Only way to distinguish is coronary angiography, scheduled in AM with Dr. Claiborne Billings. This procedure has been fully reviewed with the patient and written informed consent has been obtained. If no culprit abnormality is identified, recommend implantable loop recorder to evaluate for arrhythmia, prior to discharge.  Sanda Klein, MD, [place] [phone] 05/04/2018, 4:53 PM

## 2018-05-04 NOTE — H&P (Signed)
History and Physical    Kenneth Pope NKN:[id number] DOB: [birth year] DOA: 05/04/2018  PCP: Kenneth Bowen, MD Consultants:  Zigmund Daniel - retina; [place]; [place] - urology Patient coming from:  Home - lives with wife at [facility]; [facility]: wife, [phone]  Chief Complaint: Exertional syncope  HPI: Kenneth Pope is a 84 y.o. male with medical history significant of HTN; macular degeneration presenting with exertional syncope. His wife had eye surgery recently and they haven't been going to the gym for a couple of weeks.  They went to the gym today - he worked out on [employer], sit ups, etc.  He didn't do a heavy work out.  He felt a little light-headed after they finished.  He sat down and passed out.  He did not fall.  He was cool and clammy and with ?consciousness for about 5 minutes.  He does not remember being placed into the ambulance.   He currently feels fine.  Denies chest pain at any time.  He is able to ambulate without difficulty at this time.   ED Course:   Syncope - [facility] at [facility] facility and worked out today.  Afterwards, he had syncope x 10-15 minutes.  +EKG changes with TWI, elevated troponin and prolonged QT.  No active CP.  Cardiology will consult.  Review of Systems: As per HPI; otherwise review of systems reviewed and negative.   Ambulatory Status:  Ambulates without assistance  Past Medical History:  Diagnosis Date  . Hyperlipidemia   . Hypertension   . Macular degeneration     Past Surgical History:  Procedure Laterality Date  . HERNIA REPAIR      Social History   Socioeconomic History  . Marital status: Unknown    Spouse name: Not on file  . Number of children: Not on file  . Years of education: Not on file  . Highest education level: Not on file  Occupational History  . Occupation: retired  Scientific laboratory technician  . Financial resource strain: Not on file  . Food insecurity:    Worry: Not on file    Inability: Not on file    . Transportation needs:    Medical: Not on file    Non-medical: Not on file  Tobacco Use  . Smoking status: Former Smoker    Years: 15.00    Types: Cigarettes    Last attempt to quit: 1976    Years since quitting: 43.9  . Smokeless tobacco: Never Used  Substance and Sexual Activity  . Alcohol use: Never    Frequency: Never  . Drug use: Never  . Sexual activity: Not on file  Lifestyle  . Physical activity:    Days per week: Not on file    Minutes per session: Not on file  . Stress: Not on file  Relationships  . Social connections:    Talks on phone: Not on file    Gets together: Not on file    Attends religious service: Not on file    Active member of club or organization: Not on file    Attends meetings of clubs or organizations: Not on file    Relationship status: Not on file  . Intimate partner violence:    Fear of current or ex partner: Not on file    Emotionally abused: Not on file    Physically abused: Not on file    Forced sexual activity: Not on file  Other Topics Concern  . Not on file  Social History  Narrative  . Not on file    No Known Allergies  History reviewed. No pertinent family history.  Prior to Admission medications   Medication Sig Start Date End Date Taking? Authorizing Provider  amLODipine (NORVASC) 5 MG tablet Take 5 mg by mouth daily. 02/12/18  Yes [provider]  aspirin EC 81 MG tablet Take 81 mg by mouth daily.   Yes [provider]  benazepril (LOTENSIN) 40 MG tablet Take 40 mg by mouth daily. 02/12/18  Yes [provider]  calcium carbonate (OSCAL) 1500 (600 Ca) MG TABS tablet Take 600 mg of elemental calcium by mouth daily.   Yes [provider]  COMBIGAN 0.2-0.5 % ophthalmic solution Place 1 drop into both eyes 2 (two) times daily. 04/18/18  Yes [provider]  Multiple Vitamin (MULTIVITAMIN WITH MINERALS) TABS tablet Take 1 tablet by mouth daily.   Yes [provider]  Multiple  Vitamins-Minerals (PRESERVISION AREDS 2) CAPS Take 1 capsule by mouth daily.   Yes [provider]  omega-3 acid ethyl esters (LOVAZA) 1 g capsule Take 1 g by mouth 2 (two) times daily.   Yes [provider]  simvastatin (ZOCOR) 40 MG tablet Take 40 mg by mouth at bedtime. 04/01/18  Yes [provider]    Physical Exam: Vitals:   05/04/18 1100 05/04/18 1130 05/04/18 1200 05/04/18 1403  BP: 133/85 (!) 152/74 (!) 147/77 (!) 156/102  Pulse: (!) 40 (!) 52 (!) 54 74  Resp: (!) 9 15 19 18   Temp:    (!) 97.5 F (36.4 C)  TempSrc:    Oral  SpO2: 94% 97% 98% 97%     General:  Appears calm and comfortable and is NAD Eyes:  EOMI, normal lids, iris ENT:  grossly normal hearing, lips & tongue, mmm; appropriate dentition Neck:  no LAD, masses or thyromegaly; no carotid bruits Cardiovascular:  RRR, no m/r/g. No LE edema.  Respiratory:   CTA bilaterally with no wheezes/rales/rhonchi.  Normal respiratory effort. Abdomen:  soft, NT, ND, NABS Back:   normal alignment, no CVAT Skin:  no rash or induration seen on limited exam Musculoskeletal:  grossly normal tone BUE/BLE, good ROM, no bony abnormality Lower extremity:  No LE edema.  Limited foot exam with no ulcerations.  2+ distal pulses. Psychiatric:  grossly normal mood and affect, speech fluent and appropriate, AOx3 Neurologic:  CN 2-12 grossly intact, moves all extremities in coordinated fashion, sensation intact    Radiological Exams on Admission: Dg Chest 2 View  Result Date: 05/04/2018 CLINICAL DATA:  Syncopal episode while exercising. EXAM: CHEST - 2 VIEW COMPARISON:  None. FINDINGS: The cardiomediastinal silhouette is within normal limits. The lungs are normally to mildly hyperinflated. The interstitial markings are coarsened with increased markings noted peripherally in both lungs. No airspace consolidation, overt pulmonary edema, pleural effusion, or pneumothorax is identified. No acute osseous abnormality is  seen. IMPRESSION: 1. Increased interstitial markings bilaterally suspicious for underlying chronic interstitial lung disease. 2. No consolidation or overt edema. Electronically Signed   By: Logan Bores M.D.   On: 05/04/2018 09:25    EKG: Independently reviewed.  NSR with rate 59; prolonged QT 509; nonspecific ST changes with TWI in V4-6 (no prior available for comparison)  Labs on Admission: I have personally reviewed the available labs and imaging studies at the time of the admission.  Pertinent labs:   Glucose 149 Albumin 2.9 Troponin 0.14 Essentially normal CBC   Assessment/Plan Principal Problem:   Syncope Active Problems:  Essential hypertension   Dyslipidemia   Syncope -Patient with an episode of exertional syncope this AM. -He has not exercised recently and so he may simply have overexerted himself, but there is concern for cardiac etiology given its exertional nature - particularly in conjunction with EKG changes and + troponin. -By the [facility] syncope rule, the patient is at moderate/high risk for serious outcome and thus should be observed in the hospital. -Will monitor on telemetry -Orthostatic vital signs in AM -Trending troponins x3 - initial troponin was mildly elevated, which may indicate demand ischemia -Repeat EKG in AM - no priors to compare, but there was TWI in V4-6 -2d echo -Neuro checks  -check A1c, FLP, TSH -Cardiology consult  HTN -Continue Norvasc, Lotensin  HLD -Continue Lovaza, Zocor  DVT prophylaxis:  Lovenox  Code Status:  Full - confirmed with patient/family Family Communication: Wife present throughout evaluation Disposition Plan:  Home once clinically improved Consults called: Cardiology  Admission status: It is my clinical opinion that referral for OBSERVATION is reasonable and necessary in this patient based on the above information provided. The aforementioned taken together are felt to place the patient at high risk for further  clinical deterioration. However it is anticipated that the patient may be medically stable for discharge from the hospital within 24 to 48 hours.    Karmen Bongo MD [facility]  If note is complete, please contact covering daytime or nighttime physician. [email or web address] Password [email or web address]  05/04/2018, 3:12 PM

## 2018-05-04 NOTE — ED Notes (Signed)
Meal try ordered to send to [facility]

## 2018-05-04 NOTE — ED Triage Notes (Signed)
Pt to ER after syncopal event while exercising on a stationary bike in the gym at the independent living center. Wife reports he began cool, pale, and diaphoretic prior to syncope. Pt denies CP, abd pain, sob, etc. Initial BP 85/58 with no radial pulses. On arrival BP WNL, received 700 cc NS in route.

## 2018-05-04 NOTE — Progress Notes (Signed)
Echo tech at bedside.

## 2018-05-05 ENCOUNTER — Inpatient Hospital Stay (HOSPITAL_COMMUNITY)
Admission: EM | Disposition: A | Discharge status: Home / Self Care | Payer: Self-pay | Source: Home / Self Care | Attending: Cardiovascular Disease

## 2018-05-05 ENCOUNTER — Encounter (HOSPITAL_COMMUNITY): Payer: Self-pay | Admitting: Cardiovascular Disease

## 2018-05-05 DIAGNOSIS — I214 Non-ST elevation (NSTEMI) myocardial infarction: Secondary | ICD-10-CM | POA: Diagnosis present

## 2018-05-05 DIAGNOSIS — H3530 Unspecified macular degeneration: Secondary | ICD-10-CM | POA: Diagnosis present

## 2018-05-05 DIAGNOSIS — I071 Rheumatic tricuspid insufficiency: Secondary | ICD-10-CM | POA: Diagnosis present

## 2018-05-05 DIAGNOSIS — I358 Other nonrheumatic aortic valve disorders: Secondary | ICD-10-CM | POA: Diagnosis present

## 2018-05-05 DIAGNOSIS — I2510 Atherosclerotic heart disease of native coronary artery without angina pectoris: Secondary | ICD-10-CM | POA: Diagnosis present

## 2018-05-05 DIAGNOSIS — R7989 Other specified abnormal findings of blood chemistry: Secondary | ICD-10-CM

## 2018-05-05 DIAGNOSIS — R55 Syncope and collapse: Secondary | ICD-10-CM | POA: Diagnosis present

## 2018-05-05 DIAGNOSIS — I213 ST elevation (STEMI) myocardial infarction of unspecified site: Secondary | ICD-10-CM | POA: Diagnosis present

## 2018-05-05 DIAGNOSIS — E785 Hyperlipidemia, unspecified: Secondary | ICD-10-CM | POA: Diagnosis present

## 2018-05-05 DIAGNOSIS — I10 Essential (primary) hypertension: Secondary | ICD-10-CM | POA: Diagnosis present

## 2018-05-05 DIAGNOSIS — R778 Other specified abnormalities of plasma proteins: Secondary | ICD-10-CM

## 2018-05-05 DIAGNOSIS — Z87891 Personal history of nicotine dependence: Secondary | ICD-10-CM | POA: Diagnosis not present

## 2018-05-05 DIAGNOSIS — Z7982 Long term (current) use of aspirin: Secondary | ICD-10-CM | POA: Diagnosis not present

## 2018-05-05 HISTORY — PX: LEFT HEART CATH AND CORONARY ANGIOGRAPHY: CATH118249

## 2018-05-05 HISTORY — PX: CORONARY STENT INTERVENTION: CATH118234

## 2018-05-05 LAB — BASIC METABOLIC PANEL
Anion gap: 10 (ref 5–15)
BUN: 13 mg/dL (ref 8–23)
CHLORIDE: 105 mmol/L (ref 98–111)
CO2: 22 mmol/L (ref 22–32)
CREATININE: 0.87 mg/dL (ref 0.61–1.24)
Calcium: 8.7 mg/dL — ABNORMAL LOW (ref 8.9–10.3)
GFR calc Af Amer: >60 mL/min (ref >60)
GFR calc non Af Amer: >60 mL/min (ref >60)
Glucose, Bld: 106 mg/dL — ABNORMAL HIGH (ref 70–99)
Potassium: 3.7 mmol/L (ref 3.5–5.1)
Sodium: 137 mmol/L (ref 135–145)

## 2018-05-05 LAB — LIPID PANEL
Cholesterol: 135 mg/dL (ref 0–200)
HDL: 56 mg/dL (ref >40)
LDL CALC: 69 mg/dL (ref 0–99)
Total CHOL/HDL Ratio: 2.4 RATIO
Triglycerides: 51 mg/dL (ref <150)
VLDL: 10 mg/dL (ref 0–40)

## 2018-05-05 LAB — HEPARIN LEVEL (UNFRACTIONATED): HEPARIN UNFRACTIONATED: 0.35 [IU]/mL (ref 0.30–0.70)

## 2018-05-05 LAB — CBC
HCT: 43.1 % (ref 39.0–52.0)
HEMOGLOBIN: 14.8 g/dL (ref 13.0–17.0)
MCH: 33.2 pg (ref 26.0–34.0)
MCHC: 34.3 g/dL (ref 30.0–36.0)
MCV: 96.6 fL (ref 80.0–100.0)
Platelets: 200 10*3/uL (ref 150–400)
RBC: 4.46 MIL/uL (ref 4.22–5.81)
RDW: 11.8 % (ref 11.5–15.5)
WBC: 10.4 10*3/uL (ref 4.0–10.5)
nRBC: 0 % (ref 0.0–0.2)

## 2018-05-05 LAB — POCT ACTIVATED CLOTTING TIME: ACTIVATED CLOTTING TIME: 340 s

## 2018-05-05 LAB — MAGNESIUM: Magnesium: 1.8 mg/dL (ref 1.7–2.4)

## 2018-05-05 LAB — TROPONIN I: Troponin I: 0.26 ng/mL (ref <0.03)

## 2018-05-05 SURGERY — LEFT HEART CATH AND CORONARY ANGIOGRAPHY
Anesthesia: LOCAL

## 2018-05-05 MED ORDER — FENTANYL CITRATE (PF) 100 MCG/2ML IJ SOLN
INTRAMUSCULAR | Status: DC | PRN
  Administered 2018-05-05 (×2): 25 ug via INTRAVENOUS

## 2018-05-05 MED ORDER — TICAGRELOR 90 MG PO TABS
90.0000 mg | ORAL_TABLET | Freq: Two times a day (BID) | ORAL | Status: DC
  Administered 2018-05-05 – 2018-05-06 (×2): 90 mg via ORAL
  Filled 2018-05-05 (×2): qty 1

## 2018-05-05 MED ORDER — ACETAMINOPHEN 325 MG PO TABS: 650.0000 mg | ORAL_TABLET | ORAL | Status: DC | PRN

## 2018-05-05 MED ORDER — ATORVASTATIN CALCIUM 80 MG PO TABS
80.0000 mg | ORAL_TABLET | Freq: Every day | ORAL | Status: DC
  Administered 2018-05-05: 80 mg via ORAL
  Filled 2018-05-05: qty 1

## 2018-05-05 MED ORDER — ALUM & MAG HYDROXIDE-SIMETH 200-200-20 MG/5ML PO SUSP
30.0000 mL | ORAL | Status: DC | PRN
  Administered 2018-05-05: 30 mL via ORAL
  Filled 2018-05-05: qty 30

## 2018-05-05 MED ORDER — FENTANYL CITRATE (PF) 100 MCG/2ML IJ SOLN
INTRAMUSCULAR | Status: AC
  Filled 2018-05-05: qty 2

## 2018-05-05 MED ORDER — SODIUM CHLORIDE 0.9 % IV SOLN: 250.0000 mL | INTRAVENOUS | Status: DC | PRN

## 2018-05-05 MED ORDER — HEPARIN (PORCINE) IN NACL 1000-0.9 UT/500ML-% IV SOLN
INTRAVENOUS | Status: AC
  Filled 2018-05-05: qty 1000

## 2018-05-05 MED ORDER — LABETALOL HCL 5 MG/ML IV SOLN
10.0000 mg | INTRAVENOUS | Status: AC | PRN
  Administered 2018-05-05: 11:00:00 10 mg via INTRAVENOUS
  Filled 2018-05-05: qty 4

## 2018-05-05 MED ORDER — HEPARIN (PORCINE) IN NACL 1000-0.9 UT/500ML-% IV SOLN
INTRAVENOUS | Status: DC | PRN
  Administered 2018-05-05: 500 mL

## 2018-05-05 MED ORDER — BIVALIRUDIN BOLUS VIA INFUSION - CUPID
INTRAVENOUS | Status: DC | PRN
  Administered 2018-05-05: 57.375 mg via INTRAVENOUS

## 2018-05-05 MED ORDER — VERAPAMIL HCL 2.5 MG/ML IV SOLN
INTRAVENOUS | Status: AC
  Filled 2018-05-05: qty 2

## 2018-05-05 MED ORDER — MIDAZOLAM HCL 2 MG/2ML IJ SOLN
INTRAMUSCULAR | Status: AC
  Filled 2018-05-05: qty 2

## 2018-05-05 MED ORDER — POTASSIUM CHLORIDE CRYS ER 20 MEQ PO TBCR
40.0000 meq | EXTENDED_RELEASE_TABLET | Freq: Once | ORAL | Status: AC
  Administered 2018-05-05: 14:00:00 40 meq via ORAL
  Filled 2018-05-05: qty 2

## 2018-05-05 MED ORDER — IOHEXOL 350 MG/ML SOLN
INTRAVENOUS | Status: DC | PRN
  Administered 2018-05-05: 210 mL via INTRA_ARTERIAL

## 2018-05-05 MED ORDER — TICAGRELOR 90 MG PO TABS
ORAL_TABLET | ORAL | Status: DC | PRN
  Administered 2018-05-05: 180 mg via ORAL

## 2018-05-05 MED ORDER — SODIUM CHLORIDE 0.9 % IV SOLN: INTRAVENOUS | Status: DC

## 2018-05-05 MED ORDER — SODIUM CHLORIDE 0.9% FLUSH
3.0000 mL | Freq: Two times a day (BID) | INTRAVENOUS | Status: DC
  Administered 2018-05-05 – 2018-05-06 (×2): 3 mL via INTRAVENOUS

## 2018-05-05 MED ORDER — SODIUM CHLORIDE 0.9% FLUSH: 3.0000 mL | INTRAVENOUS | Status: DC | PRN

## 2018-05-05 MED ORDER — SODIUM CHLORIDE 0.9 % IV SOLN
INTRAVENOUS | Status: AC | PRN
  Administered 2018-05-05: 1.75 mg/kg/h via INTRAVENOUS

## 2018-05-05 MED ORDER — TICAGRELOR 90 MG PO TABS
ORAL_TABLET | ORAL | Status: AC
  Filled 2018-05-05: qty 2

## 2018-05-05 MED ORDER — HEPARIN SODIUM (PORCINE) 1000 UNIT/ML IJ SOLN
INTRAMUSCULAR | Status: AC
  Filled 2018-05-05: qty 1

## 2018-05-05 MED ORDER — MIDAZOLAM HCL 2 MG/2ML IJ SOLN
INTRAMUSCULAR | Status: DC | PRN
  Administered 2018-05-05 (×2): 1 mg via INTRAVENOUS

## 2018-05-05 MED ORDER — MAGNESIUM SULFATE IN D5W 1-5 GM/100ML-% IV SOLN
1.0000 g | Freq: Once | INTRAVENOUS | Status: AC
  Administered 2018-05-05: 1 g via INTRAVENOUS
  Filled 2018-05-05: qty 100

## 2018-05-05 MED ORDER — NITROGLYCERIN 1 MG/10 ML FOR IR/CATH LAB
INTRA_ARTERIAL | Status: AC
  Filled 2018-05-05: qty 10

## 2018-05-05 MED ORDER — HYDRALAZINE HCL 20 MG/ML IJ SOLN: 5.0000 mg | INTRAMUSCULAR | Status: AC | PRN

## 2018-05-05 MED ORDER — SODIUM CHLORIDE 0.9 % IV SOLN
INTRAVENOUS | Status: DC
  Administered 2018-05-05: 11:00:00 via INTRAVENOUS

## 2018-05-05 MED ORDER — LIDOCAINE HCL (PF) 1 % IJ SOLN
INTRAMUSCULAR | Status: DC | PRN
  Administered 2018-05-05: 2 mL
  Administered 2018-05-05: 15 mL

## 2018-05-05 MED ORDER — HEART ATTACK BOUNCING BOOK
Freq: Once | Status: AC
  Administered 2018-05-05: 22:00:00 1
  Filled 2018-05-05: qty 1

## 2018-05-05 MED ORDER — ANGIOPLASTY BOOK
Freq: Once | Status: AC
  Administered 2018-05-05: 22:00:00 1
  Filled 2018-05-05: qty 1

## 2018-05-05 MED ORDER — ASPIRIN 81 MG PO CHEW: 81.0000 mg | CHEWABLE_TABLET | Freq: Every day | ORAL | Status: DC

## 2018-05-05 MED ORDER — LIDOCAINE HCL (PF) 1 % IJ SOLN
INTRAMUSCULAR | Status: AC
  Filled 2018-05-05: qty 30

## 2018-05-05 MED ORDER — BIVALIRUDIN TRIFLUOROACETATE 250 MG IV SOLR
INTRAVENOUS | Status: AC
  Filled 2018-05-05: qty 250

## 2018-05-05 MED ORDER — DIAZEPAM 5 MG PO TABS: 5.0000 mg | ORAL_TABLET | ORAL | Status: DC | PRN

## 2018-05-05 MED ORDER — NITROGLYCERIN 1 MG/10 ML FOR IR/CATH LAB
INTRA_ARTERIAL | Status: DC | PRN
  Administered 2018-05-05 (×4): 200 ug via INTRACORONARY

## 2018-05-05 SURGICAL SUPPLY — 22 items
BALLN EMERGE MR 2.25X12 (BALLOONS) ×2
BALLN SAPPHIRE ~~LOC~~ 3.5X12 (BALLOONS) ×1 IMPLANT
BALLOON EMERGE MR 2.25X12 (BALLOONS) IMPLANT
CATH INFINITI 5 FR 3DRC (CATHETERS) ×1 IMPLANT
CATH INFINITI 5FR ANG PIGTAIL (CATHETERS) ×1 IMPLANT
CATH INFINITI MULTIPACK ST 5F (CATHETERS) ×1 IMPLANT
CATH LAUNCHER 6FR 3DRC (CATHETERS) IMPLANT
CATHETER LAUNCHER 6FR 3DRC (CATHETERS) ×2
GLIDESHEATH SLEND SS 6F .021 (SHEATH) ×1 IMPLANT
GUIDEWIRE INQWIRE 1.5J.035X260 (WIRE) IMPLANT
INQWIRE 1.5J .035X260CM (WIRE) ×2
KIT ENCORE 26 ADVANTAGE (KITS) ×1 IMPLANT
KIT HEART LEFT (KITS) ×2 IMPLANT
PACK CARDIAC CATHETERIZATION (CUSTOM PROCEDURE TRAY) ×2 IMPLANT
SHEATH PINNACLE 5F 10CM (SHEATH) ×1 IMPLANT
SHEATH PINNACLE 6F 10CM (SHEATH) ×1 IMPLANT
SHEATH PROBE COVER 6X72 (BAG) ×1 IMPLANT
STENT SIERRA 3.00 X 15 MM (Permanent Stent) ×1 IMPLANT
TRANSDUCER W/STOPCOCK (MISCELLANEOUS) ×2 IMPLANT
TUBING CIL FLEX 10 FLL-RA (TUBING) ×2 IMPLANT
WIRE EMERALD 3MM-J .035X150CM (WIRE) ×1 IMPLANT
WIRE PT2 MS 185 (WIRE) ×1 IMPLANT

## 2018-05-05 NOTE — Progress Notes (Signed)
Patient went for [facility] prior to me seeing the patient this morning. Post Cath patient transferred to Cardiology service. Spoke with Melina Copa from Cards. Will transfer care to Cardiology team. Please consult [facility] if needed.   Gerlean Ren MD [facility]

## 2018-05-05 NOTE — Progress Notes (Signed)
ANTICOAGULATION CONSULT NOTE - Follow Up Consult  Pharmacy Consult for heparin Indication: chest pain/ACS  Labs: Recent Labs    05/04/18 0738 05/04/18 1419 05/04/18 2025 05/05/18 0219  HGB 13.8  --   --  14.8  HCT 40.6  --   --  43.1  PLT 186  --   --  200  HEPARINUNFRC  --   --   --  0.35  CREATININE 1.16  --   --  0.87  TROPONINI  --  0.13* 0.26*  --     Assessment/Plan:  84yo male therapeutic on heparin with initial dosing for r/o ACS. Will continue gtt at current rate and confirm stable with additional level.   Wynona Neat, PharmD, BCPS  05/05/2018,4:13 AM

## 2018-05-05 NOTE — Interval H&P Note (Signed)
Cath Lab Visit (complete for each Cath Lab visit)  Clinical Evaluation Leading to the Procedure:   ACS: Yes.    Non-ACS:    Anginal Classification: CCS II  Anti-ischemic medical therapy: Minimal Therapy (1 class of medications)  Non-Invasive Test Results: No non-invasive testing performed  Prior CABG: No previous CABG      History and Physical Interval Note:  05/05/2018 7:30 AM  Kenneth Pope  has presented today for surgery, with the diagnosis of syncope - elevated trop  The various methods of treatment have been discussed with the patient and family. After consideration of risks, benefits and other options for treatment, the patient has consented to  Procedure(s): LEFT HEART CATH AND CORONARY ANGIOGRAPHY (N/A) as a surgical intervention .  The patient's history has been reviewed, patient examined, no change in status, stable for surgery.  I have reviewed the patient's chart and labs.  Questions were answered to the patient's satisfaction.     Shelva Majestic

## 2018-05-05 NOTE — Plan of Care (Signed)
Pt verbalized understanding plan of care. Pt alert and oriented x4. Skin warm and dry. Respirations equal and unlabored. Denies chest pain. Pt educated by nurse and provider and pt also watched educational video on cath. Pt gave informed consent, documentation at bedside. Belongings inventoried and are at bedside with patient. Pt remains with foley catheter in place to prevent acute urinary retention. Pt shows the ability manage health-related needs upon discharge.

## 2018-05-05 NOTE — Care Management (Signed)
#   2.  S/W [facility] @ [facility] # [phone]   BRILINTA  90 MG BID COVER- YES CO-PAY- $ 37.00 TIER- 3 DRUG PRIOR APPROVAL- NO  PREFERRED PHARMACY: YES [facility] AND  [facility]

## 2018-05-05 NOTE — Progress Notes (Signed)
Pt taken to cath lab. Left in stable condition.

## 2018-05-05 NOTE — Progress Notes (Addendum)
   Came to check on patient post-cath. He is resting quietly and feels well without complaint this afternoon. Wife of 27 years, Burman Nieves, at bedside. Cath showed 50% LAD (improved after IC NTG) and 85% prox RCA. The RCA was treated with PCI. Interestingly his EKG changes and mild wall motion abnormality on echo (per Dr. Lurline Del interpretation) would correlate more with anterior ischemia. This raises question of whether spasm was an issue. Telemetry was stable overnight. BP is controlled just on amlodipine for now so will hold off resumption of benazepril for now given hypotension on admission. Patient will be observed post-cath with possible DC in AM. Might consider addition of Imdur versus titration of amlodipine in case spasm was a contributing factor. Will review plans for meds and OP monitoring with MD.  BP 113/74   Pulse (!) 58   Temp 98 F (36.7 C) (Oral)   Resp 12   Ht 5\' 8"  (1.727 m)   Wt 76.5 kg   SpO2 98%   BMI 25.64 kg/m  Vital Signs. BP 113/74   Pulse (!) 58   Temp 98 F (36.7 C) (Oral)   Resp 12   Ht 5\' 8"  (1.727 m)   Wt 76.5 kg   SpO2 98%   BMI 25.64 kg/m  General: Well developed, well nourished WM in no acute distress Head: Normocephalic, atraumatic, sclera non-icteric, no xanthomas, nares are without discharge. Neck: Negative for carotid bruits. JVP not elevated. Lungs: Clear bilaterally to auscultation without wheezes, rales, or rhonchi. Breathing is unlabored. Heart: RRR S1 S2 without murmurs, rubs, or gallops.  Abdomen: Soft, non-tender, non-distended with normoactive bowel sounds. No rebound/guarding. Extremities: No clubbing or cyanosis. No edema. Distal pedal pulses are 2+ and equal bilaterally. R radial cath site without acute complication. Neuro: Alert and oriented X 3. Moves all extremities spontaneously. Psych:  Responds to questions appropriately with a normal affect.  Potassium (3.7) and magnesium (1.8) remain below goal for QT prolongation so will give 41meq  KCl and administer 1g mag sulfate IV. May need OP supplementation depending on where levels fall tomorrow.   Dayna Dunn PA-C  I have seen and examined the patient along with Dayna Dunn PA-C.  I have reviewed the chart, notes and new data.  I agree with PA/NP's note.  Key new complaints: currently asymptomatic Key examination changes: healthy cath access site Key new findings / data: ECG continues to show prominent anterior T wave inversions, deep and broad, with prolonged QT interval.  PLAN: The ECG and echo are more consistent with LAD distribution ischemia rather than fixed RCA stenosis. (I think coronary spasm is a good idea, quite likely the culprit event, maybe with post-spasm VT as in true Prinzmetal sd.). Will keep on dual antiplatelet therapy fro at least 6 months, preferably 12 months. Aggressive lipid lowering. Optimize vasodilator dose (prefer amlodipine over ACE inh). 30 day event monitor for possible arrhythmia. Probable DC tomorrow, refer to cardiac rehab.  Sanda Klein, MD, [place] [phone] 05/05/2018, 1:54 PM

## 2018-05-05 NOTE — Progress Notes (Signed)
Spoke to IM - cardiology is happy to take on our service. Someone has already changed in [place]. Dayna Dunn PA-C

## 2018-05-05 NOTE — Care Management Note (Signed)
Case Management Note  Patient Details  Name: Kenneth Pope MRN: [id number] Date of Birth: 1933-10-20  Subjective/Objective:   From home with wife, pta indep, s/p stent intervention, will be on brilinta, RN to give patient the 30 day savings coupon for brilinta.  NCM informed patient of what the estimate cost of his refills may cost. He states he goes to [facility] at the corner of [address].                   Action/Plan: DC home when ready.   Expected Discharge Date:                  Expected Discharge Plan:  Home/Self Care  In-House Referral:     Discharge planning Services  CM Consult, Medication Assistance  Post Acute Care Choice:    Choice offered to:     DME Arranged:    DME Agency:     HH Arranged:    HH Agency:     Status of Service:  Completed, signed off  If discussed at [employer] of Stay Meetings, dates discussed:    Additional Comments:  Zenon Mayo, RN 05/05/2018, 1:57 PM

## 2018-05-05 NOTE — Progress Notes (Signed)
Site area: right groin  Site Prior to Removal:  Level 0  Pressure Applied For 20 MINUTES    Minutes Beginning at 1155  Manual:   Yes.    Patient Status During Pull:  stable  Post Pull Groin Site:  Level 0  Post Pull Instructions Given:  Yes.    Post Pull Pulses Present:  Yes.    Dressing Applied:  Yes.    Comments:

## 2018-05-06 ENCOUNTER — Telehealth: Payer: Self-pay | Admitting: Adult Health

## 2018-05-06 ENCOUNTER — Other Ambulatory Visit: Payer: Self-pay | Admitting: Cardiology

## 2018-05-06 ENCOUNTER — Encounter (HOSPITAL_COMMUNITY): Payer: Self-pay | Admitting: Cardiology

## 2018-05-06 DIAGNOSIS — R55 Syncope and collapse: Secondary | ICD-10-CM

## 2018-05-06 DIAGNOSIS — I25118 Atherosclerotic heart disease of native coronary artery with other forms of angina pectoris: Secondary | ICD-10-CM

## 2018-05-06 LAB — CBC
HCT: 43.3 % (ref 39.0–52.0)
Hemoglobin: 14.4 g/dL (ref 13.0–17.0)
MCH: 32.5 pg (ref 26.0–34.0)
MCHC: 33.3 g/dL (ref 30.0–36.0)
MCV: 97.7 fL (ref 80.0–100.0)
PLATELETS: 202 10*3/uL (ref 150–400)
RBC: 4.43 MIL/uL (ref 4.22–5.81)
RDW: 11.9 % (ref 11.5–15.5)
WBC: 10 10*3/uL (ref 4.0–10.5)
nRBC: 0 % (ref 0.0–0.2)

## 2018-05-06 LAB — BASIC METABOLIC PANEL
Anion gap: 8 (ref 5–15)
BUN: 10 mg/dL (ref 8–23)
CALCIUM: 8.7 mg/dL — AB (ref 8.9–10.3)
CO2: 23 mmol/L (ref 22–32)
CREATININE: 1.03 mg/dL (ref 0.61–1.24)
Chloride: 105 mmol/L (ref 98–111)
GLUCOSE: 96 mg/dL (ref 70–99)
Potassium: 4.3 mmol/L (ref 3.5–5.1)
Sodium: 136 mmol/L (ref 135–145)

## 2018-05-06 LAB — MAGNESIUM: Magnesium: 1.9 mg/dL (ref 1.7–2.4)

## 2018-05-06 MED ORDER — AMLODIPINE BESYLATE 10 MG PO TABS: 10.0000 mg | ORAL_TABLET | Freq: Every day | ORAL | Status: DC

## 2018-05-06 MED ORDER — TICAGRELOR 90 MG PO TABS: 90.0000 mg | ORAL_TABLET | Freq: Two times a day (BID) | ORAL | 0 refills | Status: DC

## 2018-05-06 MED ORDER — ATORVASTATIN CALCIUM 80 MG PO TABS: 80.0000 mg | ORAL_TABLET | Freq: Every day | ORAL | 3 refills | Status: DC

## 2018-05-06 MED ORDER — NITROGLYCERIN 0.4 MG SL SUBL: 0.4000 mg | SUBLINGUAL_TABLET | SUBLINGUAL | 12 refills | Status: DC | PRN

## 2018-05-06 MED ORDER — CARVEDILOL 3.125 MG PO TABS: 3.1250 mg | ORAL_TABLET | Freq: Two times a day (BID) | ORAL | Status: DC

## 2018-05-06 MED ORDER — CARVEDILOL 3.125 MG PO TABS: 3.1250 mg | ORAL_TABLET | Freq: Two times a day (BID) | ORAL | 3 refills | Status: DC

## 2018-05-06 MED ORDER — TICAGRELOR 90 MG PO TABS: 90.0000 mg | ORAL_TABLET | Freq: Two times a day (BID) | ORAL | 3 refills | Status: DC

## 2018-05-06 MED ORDER — AMLODIPINE BESYLATE 10 MG PO TABS: 10.0000 mg | ORAL_TABLET | Freq: Every day | ORAL | 3 refills | Status: DC

## 2018-05-06 MED FILL — Verapamil HCl IV Soln 2.5 MG/ML: INTRAVENOUS | Qty: 2 | Status: AC

## 2018-05-06 NOTE — Progress Notes (Signed)
CARDIAC REHAB PHASE I   PRE:  Rate/Rhythm: 88 SR  BP:  Sitting: 118/87      SaO2: 96 RA  MODE:  Ambulation: 700 ft      96 peak HR  POST:  Rate/Rhythm: 84 SR  BP:  Sitting: 105/71    SaO2: 95 RA   Pt ambulated 78ft in hallway standby assist with steady gait. Pt denies SOB, CP, and dizziness. Pt educated on pt on importance of ASA, Brilinta, statin and NTG. MI book at bedside, pt thinks his wife has his stent card. Pt given heart healthy diet. Reviewed restrictions and exercise guidelines. Will refer to CRP II Banks Rufina Falco, RN BSN 05/06/2018 9:12 AM

## 2018-05-06 NOTE — Telephone Encounter (Signed)
Patient to be discharged 05/06/18.TOC 05/07/18

## 2018-05-06 NOTE — Discharge Instructions (Signed)
Acute Coronary Syndrome °Acute coronary syndrome (ACS) is a serious problem in which there is suddenly not enough blood and oxygen reaching the heart. ACS can result in chest pain or a heart attack. °What are the causes? °This condition may be caused by: °· A buildup of fat and cholesterol inside of the arteries (atherosclerosis). This is the most common cause. The buildup (plaque) can cause the blood vessels in your heart (coronary arteries) to become narrow or blocked. Plaque can also break off to form a clot. °· A coronary spasm. °· A tearing of the coronary artery (spontaneous coronary artery dissection). °· Low blood pressure (hypotension). °· An abnormal heart beat (arrhythmia). °· Using cocaine or methamphetamine. ° °What increases the risk? °The following factors may make you more likely to develop this condition: °· Age. °· History of chest pain, heart attack, or stroke. °· Being male. °· Family history of chest pain, heart disease, or stroke. °· Smoking. °· Inactivity. °· Being overweight. °· High cholesterol. °· High blood pressure (hypertension). °· Diabetes. °· Excessive alcohol use. ° °What are the signs or symptoms? °Common symptoms of this condition include: °· Chest pain. The pain may last long, or may stop and come back (recur). It may feel like: °? Crushing or squeezing. °? Tightness, pressure, fullness, or heaviness. °· Arm, neck, jaw, or back pain. °· Heartburn or indigestion. °· Shortness of breath. °· Nausea. °· Sudden cold sweats. °· Lightheadedness. °· Dizziness. °· Tiredness (fatigue). ° °Sometimes there are no symptoms. °How is this diagnosed? °This condition may be diagnosed through: °· An electrocardiogram (ECG). This test records the impulses of the heart. °· Blood tests. °· A CT scan of the chest. °· A coronary angiogram. This procedure checks for a blockage in the coronary arteries. ° °How is this treated? °Treatment for this condition may include: °· Oxygen. °· Medicines, such  as: °? Antiplatelet medicines and blood-thinning medicines, such as aspirin. These help prevent blood clots. °? Fibrinolytic therapy. This breaks apart a blood clot. °? Blood pressure medicines. °? Nitroglycerin. °? Pain medicine. °? Cholesterol medicine. °· A procedure called coronary angioplasty and stenting. This is done to widen a narrowed artery and keep it open. °· Coronary artery bypass surgery. This allows blood to pass the blockage to reach your heart. °· Cardiac rehabilitation. This is a program that helps improve your health and well-being. It includes exercise training, education, and counseling to help you recover. ° °Follow these instructions at home: °Eating and drinking °· Follow a heart-healthy, low-salt (sodium) diet. °· Use healthy cooking methods such as roasting, grilling, broiling, baking, poaching, steaming, or stir-frying. °· Talk to a dietitian to learn about healthy cooking methods and how to eat less sodium. °Medicines °· Take over-the-counter and prescription medicines only as told by your health care provider. °· Do not take these medicines unless your health care provider approves: °? Nonsteroidal anti-inflammatory drugs (NSAIDs), such as ibuprofen, naproxen, or celecoxib. °? Vitamin supplements that contain vitamin A or vitamin E. °? Hormone replacement therapy that contains estrogen. °Activity °· Join a cardiac rehabilitation program. °· Ask your health care provider: °? What activities and exercises are safe for you. °? If you should follow specific instructions about lifting, driving, or climbing stairs. °· If you are taking aspirin and another blood thinning medicine, avoid activities that are likely to result in an injury. The medicines can increase your risk of bleeding. °Lifestyle °· Do not use any products that contain nicotine or tobacco, such as cigarettes   and e-cigarettes. If you need help quitting, ask your health care provider. °· If you drink alcohol and your health care  provider says it is okay to drink, limit your alcohol intake to no more than 1 drink per day. One drink equals 12 oz of beer, 5 oz of wine, or 1½ oz of hard liquor. °· Maintain a healthy weight. If you need to lose weight, do it in a way that has been approved by your health care provider. °General instructions °· Tell all your health care providers about your heart condition, including your dentist. Some medicines can increase your risk of arrhythmia. °· Manage other health conditions, such as hypertension and diabetes. These conditions affect your heart. °· Learn ways to manage stress. °· Get screened for depression, and seek treatment if needed. °· Monitor your blood pressure if told by your health care provider. °· Keep your vaccinations up to date. Get the annual influenza vaccine. °· Keep all follow-up visits as told by your health care provider. This is important. °Contact a health care provider if: °· You feel overwhelmed or sad. °· You have trouble with your daily activities. °Get help right away if: °· You have pain in your chest, neck, arm, jaw, stomach, or back that recurs, and: °? Lasts more than a few minutes. °? Is not relieved by taking the medicineyour health care provider prescribed. °· You have unexplained: °? Heavy sweating. °? Heartburn or indigestion. °? Shortness of breath. °? Difficulty breathing. °? Nausea or vomiting. °? Fatigue. °? Nervousness or anxiety. °? Weakness. °? Diarrhea. °? Dark stools or blood in the stool. °· You have sudden lightheadedness or dizziness. °· Your blood pressure is higher than 180/120 °· You faint. °· You feel like hurting yourself or think about taking your own life. °These symptoms may represent a serious problem that is an emergency. Do not wait to see if the symptoms will go away. Get medical help right away. Call your local emergency services (911 in the [place] Do not drive yourself to the clinic or hospital. °Summary °· Acute coronary syndrome (ACS) is a  when there is not enough blood and oxygen being supplied to the heart. ACS can result in chest pain or a heart attack. °· Acute coronary syndrome is a medical emergency. If you have any symptoms of this condition, get help right away. °· Treatment includes oxygen, medicines, and procedures to open the blocked arteries and restore blood flow. °This information is not intended to replace advice given to you by your health care provider. Make sure you discuss any questions you have with your health care provider. °Document Released: 06/03/2005 Document Revised: 07/05/2016 Document Reviewed: 07/05/2016 °[employer] Interactive Patient Education © 2018 [employer] ° °

## 2018-05-06 NOTE — Discharge Summary (Addendum)
Discharge Summary    Patient ID: Kenneth Pope MRN: [id number]; DOB: Oct 26, 1933  Admit date: 05/04/2018 Discharge date: 05/06/2018  Primary Care Provider: Reynold Bowen, MD  Primary Cardiologist: Sanda Klein, MD will be Dr. Percival Spanish per pt request.  Primary Electrophysiologist:  None   Discharge Diagnoses    Principal Problem:   Syncope Active Problems:   Essential hypertension   Dyslipidemia   Elevated troponin   NSTEMI (non-ST elevation myocardial infarction) (HCC)  Allergies No Known Allergies  Diagnostic Studies/Procedures    CORONARY STENT INTERVENTION  LEFT HEART CATH AND CORONARY ANGIOGRAPHY 05/05/18  Conclusion    Prox RCA lesion is 85% stenosed.  Mid RCA lesion is 50% stenosed.  Prox LAD lesion is 50% stenosed.  Post intervention, there is a 0% residual stenosis.  A stent was successfully placed.   Two-vessel coronary obstructive disease with a very focal 50% stenosis immediately after the takeoff of the proximal diagonal vessel in the LAD slightly improved following IC nitroglycerin administration, and a large dominant RCA with acute angle shepherd's crook takeoff with 85% stenosis in the proximal bend followed by 50% smooth stenosis further down in the proximal-mid vessel.  LVEDP 13 mm.  Successful PCI to the proximal RCA with ultimate insertion of a 3.0 x 15 mm Xience DES stent postdilated to 3.5 mm with the 85% stenosis being reduced to 0%.  RECOMMENDATION: Increase medical therapy for concomitant CAD and high potency statin therapy; will change to atorvastatin 80 mg daily.  The patient is in need for dental extraction which had been tentatively planned for tomorrow.  This will need to be cancelled and deferrered.   In light of his age and bleed risk, he may be a candidate for participation in the Xience 28 study with reduction in length of DAPT therapy.  _____________  Echocardiogram 05/04/18 Study Conclusions - Left ventricle: The  cavity size was normal. Wall thickness was   normal. Systolic function was normal. The estimated ejection   fraction was in the range of 60% to 65%. Wall motion was normal;   there were no regional wall motion abnormalities. The study is   not technically sufficient to allow evaluation of LV diastolic   function. - Aortic valve: Trileaflet. Sclerosis without stenosis. There was   trivial regurgitation. - Left atrium: The atrium was normal in size. - Tricuspid valve: There was mild regurgitation. - Pulmonary arteries: PA peak pressure: 38 mm Hg (S). - Inferior vena cava: The vessel was normal in size. The   respirophasic diameter changes were in the normal range (>= 50%),   consistent with normal central venous pressure.  Impressions: - LVEF 60-65%, normal wall thickness, normal wall motion, aortic   valve sclerosis with trivial AI, normal LA size, mild TR, RVSP 38   mmHg, normal IVC.   History of Present Illness     Kenneth Pope is a 84 y.o. active male with HTN, HLD, former tobacco abuse (quit 1970s), macular degeneration who was being seen  for the evaluation of syncope, elevated troponin and prolonged QT.  Kenneth Pope considers himself to be very healthy, stating, "age isn't anything but a number." He denies any prior cardiac hx. He and his wife exercise regularly except recently took a hiatus due to her having eye surgery. He was in [facility] and went through his usual 20 minute routine of various exercises on the day of presentation. He had no cardiac symptoms while doing so. While standing he felt lightheaded so told his  wife he needed to sit down. While seated he believes he passed out for just a few seconds. The H/P indicates this was for about 5 minutes but the patient denies this now that he's had some time to process the event. He states he completely lost consciousness for a few seconds but when he came to he was out of it - still able to hear people talking to him  but couldn't fully comprehend what was going on. No b/b incontinence or seizure activity reported. He didn't remember being placed in the ambulance. Wife pressed their medic alert button to summon EMS. There is no mention in ER notes or H/P about any arrhythmia or CPR needed upon their arrival. Per notes, wife had reported he was cool, pale and diaphoretic prior to syncope. His initial BP was 85/58 without radial pulses.   He received 700cc NS en route and an additional 102mL bolus here. His BP was soft in the ER but has since come up to [id number] systolic. He denied any recent similar episodes and felt fine upon cardiology consult. EKG showed NSR with biphasic appearing TW V2, TWI I, II, aVL, V4-V6 with prolonged QT of 519ms without prior to compare to.preliminary echo read by Dr. Sallyanne Kuster - EF is preserved but preliminarily there is a wall motion abnormality, he appears dry, and has mildly elevated pulm pressure without significant valvular disease. His HR does increase to the 1teens sitting up and standing, indicative of orthostasis, although not presently reporting symptoms. No obvious precipitants of volume loss otherwise. No fam hx of SCD, syncope or heart disease of any kind.  The patient was admitted for further evaluation.   Hospital Course     Consultants: None  Troponins were elevated 0.13 > 0.26. He was treated for NSTEMI and underwent left heart cath on 05/05/18 that revealed two-vessel coronary artery disease with a very focal 50% stenosis in the LAD (see above report) that slightly improved with IC nitroglycerin and a large dominant RCA with acute angle shepherd's crook takeoff with 85% stenosis int he proximal bend and 50% stenosis further down in the proximal-mid vessel. 3.0 X 98mm Xience DES was placed to the RCA.   Echo showed preserved LV function with EF 60-65%, normal wall motion, trivial AI.   Per Dr. Sallyanne Kuster the EKG and echo findings are more consistent with LAD distribution  ischemia rather than fixed RCA stenosis. Coronary vasospasm may be contributing and we will increase amlodipine to 10 mg, stop ACE-I and add low dose carvedilol due to syncope on presentation and possibiltiy of arrhythmia. Will order 30 day monitor to evaluate for arrhythmia.   He has been started on DAPT with aspirin 81 mg and Ticagrelor 90 mg BID with plan to continue for at least 6 months, preferably 12 months. High intensity statin was initiated with atorvastatin 80 mg. LDL 69.   Right groin cath site is stable without hematoma. Renal function is stable with SCr 1.03 this am. Tele has been stable with sinus rhythm in the 60's at rest and up to 90's with ambulation. BP has been mostly well controlled, intermittently elevated, Last reading 118/87.   EKG this am is somewhat improved with TWI better anteriorly.  Dental extraction will need to be deferred and pt understands.  Pt will follow up with Dr. Percival Spanish as he is his wife's cardiologist.   Patient has been seen by Dr. Sallyanne Kuster today and deemed ready for discharge home. All follow up appointments have been scheduled. Discharge medications  are listed below.  _____________  Discharge Vitals Blood pressure 118/87, pulse 91, temperature 97.9 F (36.6 C), temperature source Oral, resp. rate 15, height 5\' 8"  (1.727 m), weight 77 kg, SpO2 97 %.  Filed Weights   05/04/18 1600 05/05/18 0500 05/06/18 0428  Weight: 75.3 kg 76.5 kg 77 kg    Labs & Radiologic Studies    CBC Recent Labs    05/04/18 0738 05/05/18 0219 05/06/18 0350  WBC 7.5 10.4 10.0  NEUTROABS 3.7  --   --   HGB 13.8 14.8 14.4  HCT 40.6 43.1 43.3  MCV 99.0 96.6 97.7  PLT 186 200 371   Basic Metabolic Panel Recent Labs    05/05/18 0219 05/06/18 0350  NA 137 136  K 3.7 4.3  CL 105 105  CO2 22 23  GLUCOSE 106* 96  BUN 13 10  CREATININE 0.87 1.03  CALCIUM 8.7* 8.7*  MG 1.8 1.9   Liver Function Tests Recent Labs    05/04/18 0738  AST 27  ALT 16  ALKPHOS  31*  BILITOT 0.9  PROT 5.5*  ALBUMIN 2.9*   No results for input(s): LIPASE, AMYLASE in the last 72 hours. Cardiac Enzymes Recent Labs    05/04/18 1419 05/04/18 2025 05/05/18 0219  TROPONINI 0.13* 0.26* 0.26*   BNP Invalid input(s): POCBNP D-Dimer No results for input(s): DDIMER in the last 72 hours. Hemoglobin A1C Recent Labs    05/04/18 0738  HGBA1C 5.7*   Fasting Lipid Panel Recent Labs    05/05/18 0219  CHOL 135  HDL 56  LDLCALC 69  TRIG 51  CHOLHDL 2.4   Thyroid Function Tests Recent Labs    05/04/18 1419  TSH 1.748   _____________  Dg Chest 2 View  Result Date: 05/04/2018 CLINICAL DATA:  Syncopal episode while exercising. EXAM: CHEST - 2 VIEW COMPARISON:  None. FINDINGS: The cardiomediastinal silhouette is within normal limits. The lungs are normally to mildly hyperinflated. The interstitial markings are coarsened with increased markings noted peripherally in both lungs. No airspace consolidation, overt pulmonary edema, pleural effusion, or pneumothorax is identified. No acute osseous abnormality is seen. IMPRESSION: 1. Increased interstitial markings bilaterally suspicious for underlying chronic interstitial lung disease. 2. No consolidation or overt edema. Electronically Signed   By: Logan Bores M.D.   On: 05/04/2018 09:25   Disposition   Pt is being discharged home today in good condition.  Follow-up Plans & Appointments    Follow-up Information    Lendon Colonel, NP Follow up.   Specialties:  Nurse Practitioner, Radiology, Cardiology Why:  Cardiology hospital follow up 05/13/18 at 9:30 with Dr. Rosezella Florida NP.  Contact information: [address] [place] [address] [place] Follow up.   Specialty:  Cardiology Why:  Appointment to have a heart monitor placed on 05/07/18 at 9:00 am. at our [employer].  Contact information: [address] [place] [phone]         Discharge Instructions    Amb Referral to Cardiac Rehabilitation   Complete by:  As directed    Diagnosis:   NSTEMI Coronary Stents     Diet - low sodium heart healthy   Complete by:  As directed    Discharge instructions   Complete by:  As directed    PLEASE REMEMBER TO BRING ALL OF YOUR MEDICATIONS TO EACH OF YOUR FOLLOW-UP OFFICE VISITS.  PLEASE  ATTEND ALL SCHEDULED FOLLOW-UP APPOINTMENTS.   Activity: Increase activity slowly as tolerated. You may shower, but no soaking baths (or swimming) for 1 week. No driving for 24 hours. No lifting over 5 lbs for 1 week. No sexual activity for 1 week.   You May Return to Work: in 1 week (if applicable)  Wound Care: You may wash cath site gently with soap and water. Keep cath site clean and dry. If you notice pain, swelling, bleeding or pus at your cath site, please call [phone].   Increase activity slowly   Complete by:  As directed       Discharge Medications   Allergies as of 05/06/2018   No Known Allergies     Medication List    STOP taking these medications   benazepril 40 MG tablet Commonly known as:  LOTENSIN   simvastatin 40 MG tablet Commonly known as:  ZOCOR     TAKE these medications   amLODipine 10 MG tablet Commonly known as:  NORVASC Take 1 tablet (10 mg total) by mouth daily. Start taking on:  05/07/2018 What changed:    medication strength  how much to take   aspirin EC 81 MG tablet Take 81 mg by mouth daily.   atorvastatin 80 MG tablet Commonly known as:  LIPITOR Take 1 tablet (80 mg total) by mouth daily at 6 PM.   calcium carbonate 1500 (600 Ca) MG Tabs tablet Commonly known as:  OSCAL Take 600 mg of elemental calcium by mouth daily.   carvedilol 3.125 MG tablet Commonly known as:  COREG Take 1 tablet (3.125 mg total) by mouth 2 (two) times daily with a meal.   COMBIGAN 0.2-0.5 % ophthalmic solution Generic drug:  brimonidine-timolol Place 1  drop into both eyes 2 (two) times daily.   multivitamin with minerals Tabs tablet Take 1 tablet by mouth daily.   nitroGLYCERIN 0.4 MG SL tablet Commonly known as:  NITROSTAT Place 1 tablet (0.4 mg total) under the tongue every 5 (five) minutes as needed for chest pain.   omega-3 acid ethyl esters 1 g capsule Commonly known as:  LOVAZA Take 1 g by mouth 2 (two) times daily.   PRESERVISION AREDS 2 Caps Take 1 capsule by mouth daily.   ticagrelor 90 MG Tabs tablet Commonly known as:  BRILINTA Take 1 tablet (90 mg total) by mouth 2 (two) times daily.        Acute coronary syndrome (MI, NSTEMI, STEMI, etc) this admission?: Yes.     AHA/ACC Clinical Performance & Quality Measures: 1. Aspirin prescribed? - Yes 2. ADP Receptor Inhibitor (Plavix/Clopidogrel, Brilinta/Ticagrelor or Effient/Prasugrel) prescribed (includes medically managed patients)? - Yes 3. Beta Blocker prescribed? - No - Hypotension 4. High Intensity Statin (Lipitor 40-80mg  or Crestor 20-40mg ) prescribed? - Yes 5. EF assessed during THIS hospitalization? - Yes 6. For EF <40%, was ACEI/ARB prescribed? - Not Applicable (EF >/= 83%) 7. For EF <40%, Aldosterone Antagonist (Spironolactone or Eplerenone) prescribed? - Not Applicable (EF >/= 41%) 8. Cardiac Rehab Phase II ordered (Included Medically managed Patients)? - Yes     Outstanding Labs/Studies   Follow up FLP and LFTs in 6 weeks.   Duration of Discharge Encounter   Greater than 30 minutes including physician time.  Signed, Daune Perch, NP 05/06/2018, 9:16 AM   I have seen and examined the patient along with Daune Perch, NP.  I have reviewed the chart, notes and new data.  I agree with PA/NP's note.  Key new complaints: feels  well - no cath access problems, angina, dyspnea or near-syncope Key examination changes: normal CV exam Key new findings / data: no arrhythmia on monitor, ECG shows some improvement in T wave inversion, but QT still prolonged  (suspect V2-V5 lead reversal on this AM ECG)  PLAN: Not everything "adds up" as to his clinical presentation, ECG/echo findings and angiographic findings. He presented with syncope, not angina. His ECG changes suggest LAD territory ischemia, not RCA. It is possible that he had LAD coronary vasospasm and secondary ventricular arrhythmia (as in true Prinzmetal sd.). Will increase vasodilator calcium channel blocker and use a low dose of combined alpha-beta blocker, which will replace the ACE inh for HTN. Substitute atorvastatin instead of simvastatin due to the interaction with amlodipine. 30 day event monitor. Mandatory DAPT for minimum 6 months, preferably 12 months. He will discuss whether he can continue injections for macular degeneration with his ophthalmologist and whether he can proceed with molar root extraction with his oral surgeon, while on uninterrupted ASA/ticagrelor. Referred to cardiac rehab and early TOC visit.  Sanda Klein, MD, [place] [phone] 05/06/2018, 9:24 AM

## 2018-05-06 NOTE — Telephone Encounter (Signed)
New Message   Patient has as TOC with Jory Sims 11/27

## 2018-05-07 ENCOUNTER — Telehealth (HOSPITAL_COMMUNITY): Payer: Self-pay

## 2018-05-07 ENCOUNTER — Ambulatory Visit (INDEPENDENT_AMBULATORY_CARE_PROVIDER_SITE_OTHER): Payer: Medicare Other

## 2018-05-07 DIAGNOSIS — R55 Syncope and collapse: Secondary | ICD-10-CM | POA: Diagnosis not present

## 2018-05-07 NOTE — Telephone Encounter (Signed)
Patient contacted regarding discharge from [facility] on 05/06/18.  Patient understands to follow up with provider Jory Sims, DNP on 05/13/18 at 930 at [facility]. Patient understands discharge instructions? yes Patient understands medications and regiment? yes Patient understands to bring all medications to this visit? yes

## 2018-05-07 NOTE — Telephone Encounter (Signed)
Pt insurance is active and benefits verified through [employer]. Co-pay $0.00, DED $0.00/$0.00 met, out of pocket $5,000.00/$639.38 met, co-insurance 20%. No pre-authorization required. Melissa B./[employer], 05/07/18 @ 2:47PM, REF# [id number]  Will contact patient to see if he is interested in the Cardiac Rehab Program. If interested, patient will need to complete follow up appt. Once completed, patient will be contacted for scheduling upon review by the RN Navigator.

## 2018-05-07 NOTE — Telephone Encounter (Signed)
Called patient in regards to CR, pt stated he is unsure if he would like to participate in CR here at [facility] or the CR program thru his retirement community. He would like to wait to make a decision until after his f./u appt on 05/13/18.  Will f/u with pt after f/u appt.

## 2018-05-11 ENCOUNTER — Encounter (INDEPENDENT_AMBULATORY_CARE_PROVIDER_SITE_OTHER): Payer: Medicare Other | Admitting: Ophthalmology

## 2018-05-11 DIAGNOSIS — H43813 Vitreous degeneration, bilateral: Secondary | ICD-10-CM

## 2018-05-11 DIAGNOSIS — H35033 Hypertensive retinopathy, bilateral: Secondary | ICD-10-CM

## 2018-05-11 DIAGNOSIS — I10 Essential (primary) hypertension: Secondary | ICD-10-CM

## 2018-05-11 DIAGNOSIS — H33301 Unspecified retinal break, right eye: Secondary | ICD-10-CM

## 2018-05-11 DIAGNOSIS — H353221 Exudative age-related macular degeneration, left eye, with active choroidal neovascularization: Secondary | ICD-10-CM | POA: Diagnosis not present

## 2018-05-11 DIAGNOSIS — H353112 Nonexudative age-related macular degeneration, right eye, intermediate dry stage: Secondary | ICD-10-CM

## 2018-05-11 NOTE — Progress Notes (Addendum)
Cardiology Office Note   Date:  05/13/2018   ID:  Umair, Rosiles January 07, 1934, MRN [id number]  PCP:  Reynold Bowen, MD  Cardiologist:  [facility]  Chief Complaint  Patient presents with  . Hospitalization Follow-up  . Coronary Artery Disease    s/p DES to the RCA     History of Present Illness: Chancelor Gene Grabill is a 84 y.o. male who presents for post hospital follow up after admission for syncope, with elevated troponin and prolonged QT. Other history includes hypertension, HLD and former tobacco abuse. He was very hypotensive on arrival to ER and received fluid bolus. Due to elevated troponin the patient was subsequently scheduled for cardiac cath.   Cath on 05/05/18 revealing 2 vessel disease with very focal 50% stenosis in the LAD, that slightly improved with IC nitroglycerine with a large dominant RCA with acute angle shepherd's crook takeoff with 85% stenosis in the proximal bend and 50% stenosis further down the proximal-mid vessel. He required a DES to the RCA. It was also thought that he was having a LAD coronary spasm as the EKG and echo findings were more consistent with LAD distribution.   ACE inhibitor was stopped and low dose carvedilol was started due to the possibility of arrhythmia causing syncopal episode.  A 30 cardiac monitor was ordered on discharge.    He is without complaint of recurrent chest pain, DOE, or bleeding on Brilinta. He has been walking 5 minutes on a treadmill at a slow speed. He would like to participate in cardiac rehab.  He is medically compliant.   Past Medical History:  Diagnosis Date  . Arthritis   . CAD (coronary artery disease) 05/04/2018   a. 05/04/18 NSTEMI, 05/05/18 DES to dominant RCA  . Cancer ([place])    skin lesion  . Former tobacco use   . Hyperlipidemia   . Hypertension   . Macular degeneration     Past Surgical History:  Procedure Laterality Date  . CORONARY STENT INTERVENTION N/A 05/05/2018   Procedure: CORONARY  STENT INTERVENTION;  Surgeon: Troy Sine, MD;  Location: [place];  Service: Cardiovascular;  Laterality: N/A;  . HERNIA REPAIR    . LEFT HEART CATH AND CORONARY ANGIOGRAPHY N/A 05/05/2018   Procedure: LEFT HEART CATH AND CORONARY ANGIOGRAPHY;  Surgeon: Troy Sine, MD;  Location: [place] [facility];  Service: Cardiovascular;  Laterality: N/A;     Current Outpatient Medications  Medication Sig Dispense Refill  . amLODipine (NORVASC) 10 MG tablet Take 1 tablet (10 mg total) by mouth daily. 90 tablet 3  . aspirin EC 81 MG tablet Take 81 mg by mouth daily.    Marland Kitchen atorvastatin (LIPITOR) 80 MG tablet Take 1 tablet (80 mg total) by mouth daily at 6 PM. 90 tablet 3  . calcium carbonate (OSCAL) 1500 (600 Ca) MG TABS tablet Take 600 mg of elemental calcium by mouth daily.    . carvedilol (COREG) 3.125 MG tablet Take 1 tablet (3.125 mg total) by mouth 2 (two) times daily with a meal. 90 tablet 3  . COMBIGAN 0.2-0.5 % ophthalmic solution Place 1 drop into both eyes 2 (two) times daily.  3  . Multiple Vitamin (MULTIVITAMIN WITH MINERALS) TABS tablet Take 1 tablet by mouth daily.    . Multiple Vitamins-Minerals (PRESERVISION AREDS 2) CAPS Take 1 capsule by mouth daily.    . nitroGLYCERIN (NITROSTAT) 0.4 MG SL tablet Place 1 tablet (0.4 mg total) under the tongue every 5 (five)  minutes as needed for chest pain. 25 tablet 12  . omega-3 acid ethyl esters (LOVAZA) 1 g capsule Take 1 g by mouth 2 (two) times daily.    . ticagrelor (BRILINTA) 90 MG TABS tablet Take 1 tablet (90 mg total) by mouth 2 (two) times daily. 180 tablet 3   No current facility-administered medications for this visit.     Allergies:   Patient has no known allergies.    Social History:  The patient  reports that he quit smoking about 43 years ago. His smoking use included cigarettes. He quit after 15.00 years of use. He has never used smokeless tobacco. He reports that he does not drink alcohol or use drugs.   Family  History:  The patient's family history is not on file.    ROS: All other systems are reviewed and negative. Unless otherwise mentioned in H&P    PHYSICAL EXAM: VS:  BP 116/74   Pulse 62   Ht 5\' 8"  (1.727 m)   Wt 172 lb 3.2 oz (78.1 kg) Comment: 166 pt reports @ home  SpO2 99%   BMI 26.18 kg/m  , BMI Body mass index is 26.18 kg/m. GEN: Well nourished, well developed, in no acute distress HEENT: normal Neck: no JVD, carotid bruits, or masses Cardiac: RRR; no murmurs, rubs, or gallops,no edema  Respiratory:  Clear to auscultation bilaterally, normal work of breathing GI: soft, nontender, nondistended, + BS MS: no deformity or atrophy Skin: warm and dry, no rash Neuro:  Strength and sensation are intact Psych: euthymic mood, full affect   EKG:  Sinus bradycardia rate of 55 bpm, Non-specific T-wave abnormality.   Recent Labs: 05/04/2018: ALT 16; TSH 1.748 05/06/2018: BUN 10; Creatinine, Ser 1.03; Hemoglobin 14.4; Magnesium 1.9; Platelets 202; Potassium 4.3; Sodium 136    Lipid Panel    Component Value Date/Time   CHOL 135 05/05/2018 0219   TRIG 51 05/05/2018 0219   HDL 56 05/05/2018 0219   CHOLHDL 2.4 05/05/2018 0219   VLDL 10 05/05/2018 0219   LDLCALC 69 05/05/2018 0219      Wt Readings from Last 3 Encounters:  05/13/18 172 lb 3.2 oz (78.1 kg)  05/06/18 169 lb 12.1 oz (77 kg)      Other studies Reviewed: Echo 111/18/2019 Left ventricle: The cavity size was normal. Wall thickness was   normal. Systolic function was normal. The estimated ejection   fraction was in the range of 60% to 65%. Wall motion was normal;   there were no regional wall motion abnormalities. The study is   not technically sufficient to allow evaluation of LV diastolic   function. - Aortic valve: Trileaflet. Sclerosis without stenosis. There was   trivial regurgitation. - Left atrium: The atrium was normal in size. - Tricuspid valve: There was mild regurgitation. - Pulmonary arteries: PA  peak pressure: 38 mm Hg (S). - Inferior vena cava: The vessel was normal in size. The   respirophasic diameter changes were in the normal range (>= 50%),   consistent with normal central venous pressure.  Impressions:  - LVEF 60-65%, normal wall thickness, normal wall motion, aortic   valve sclerosis with trivial AI, normal LA size, mild TR, RVSP 38   mmHg, normal IVC.  Cardiac Cath 05/05/2018 Conclusion     Prox RCA lesion is 85% stenosed.  Mid RCA lesion is 50% stenosed.  Prox LAD lesion is 50% stenosed.  Post intervention, there is a 0% residual stenosis.  A stent was successfully placed.  Two-vessel coronary obstructive disease with a very focal 50% stenosis immediately after the takeoff of the proximal diagonal vessel in the LAD slightly improved following IC nitroglycerin administration, and a large dominant RCA with acute angle shepherd's crook takeoff with 85% stenosis in the proximal bend followed by 50% smooth stenosis further down in the proximal-mid vessel.  LVEDP 13 mm.  Successful PCI to the proximal RCA with ultimate insertion of a 3.0 x 15 mm Xience DES stent postdilated to 3.5 mm with the 85% stenosis being reduced to 0%.     ASSESSMENT AND PLAN:  1.  CAD: S/P NSTEMI with subsequent cardiac cath, revealing 85% RCA occlusion requiring DES. He has 50% residual stenosis distal to the stent in the mid RCA, and 50% in the proximal LAD.  He is tolerating the DAPT and statin without any complaints. He is to go to cardiac rehab. I have reviewed his cardiac cath and discussed this with the patient, providing a copy of the cath illustration.   He remains on cardiac monitor for evaluation of recurrent bradycardia.   2. Hypercholesterolemia  He is to continue statin therapy.   3. Hypertensive: Blood pressure is well controlled currently. He will continue amlodipine.   Current medicines are reviewed at length with the patient today.    Labs/ tests ordered today  include: None. Refer to Cardiac Rehab.   Phill Myron. West Pugh, ANP, AACC   05/13/2018 10:07 AM    [address] [address] Office [phone] Fax [phone]

## 2018-05-13 ENCOUNTER — Encounter: Payer: Self-pay | Admitting: Adult Health

## 2018-05-13 ENCOUNTER — Ambulatory Visit: Payer: Medicare Other | Admitting: Adult Health

## 2018-05-13 VITALS — BP 116/74 | HR 62 | Ht 68.0 in | Wt 172.2 lb

## 2018-05-13 DIAGNOSIS — I2510 Atherosclerotic heart disease of native coronary artery without angina pectoris: Secondary | ICD-10-CM

## 2018-05-13 DIAGNOSIS — I214 Non-ST elevation (NSTEMI) myocardial infarction: Secondary | ICD-10-CM

## 2018-05-13 DIAGNOSIS — E7800 Pure hypercholesterolemia, unspecified: Secondary | ICD-10-CM

## 2018-05-13 DIAGNOSIS — I10 Essential (primary) hypertension: Secondary | ICD-10-CM

## 2018-05-13 MED ORDER — TICAGRELOR 90 MG PO TABS: 90.0000 mg | ORAL_TABLET | Freq: Two times a day (BID) | ORAL | 3 refills | Status: DC

## 2018-05-13 MED ORDER — ATORVASTATIN CALCIUM 80 MG PO TABS: 80.0000 mg | ORAL_TABLET | Freq: Every day | ORAL | 3 refills | Status: DC

## 2018-05-13 MED ORDER — CARVEDILOL 3.125 MG PO TABS: 3.1250 mg | ORAL_TABLET | Freq: Two times a day (BID) | ORAL | 3 refills | Status: DC

## 2018-05-13 NOTE — Patient Instructions (Signed)
Follow-Up: You will need a follow up appointment in 3 months.  You may see  DR Hans Eden, DNP, [place] -or- one of the following Advanced Practice Providers on your designated Care Team:   . Rosaria Ferries, PA-C . Jory Sims, DNP, ANP  Medication Instructions:  NO CHANGES- Your physician recommends that you continue on your current medications as directed. Please refer to the Current Medication list given to you today. If you need a refill on your cardiac medications before your next appointment, please call your pharmacy.  Labwork: If you have labs (blood work) drawn today and your tests are completely normal, you will receive your results ONLY by: . MyChart Message (if you have MyChart) -OR- . A paper copy in the mail At [facility], you and your health needs are our priority.  As part of our continuing mission to provide you with exceptional heart care, we have created designated Provider Care Teams.  These Care Teams include your primary Cardiologist (physician) and Advanced Practice Providers (APPs -  Physician Assistants and Nurse Practitioners) who all work together to provide you with the care you need, when you need it.  Thank you for choosing [facility] at [facility]!!

## 2018-05-16 ENCOUNTER — Encounter (HOSPITAL_COMMUNITY): Payer: Self-pay

## 2018-05-16 ENCOUNTER — Emergency Department (HOSPITAL_COMMUNITY)
Admission: EM | Admit: 2018-05-16 | Discharge: 2018-05-16 | Disposition: A | Discharge status: Home / Self Care | Payer: Medicare Other | Attending: Emergency Medicine | Admitting: Emergency Medicine

## 2018-05-16 ENCOUNTER — Other Ambulatory Visit: Payer: Self-pay

## 2018-05-16 DIAGNOSIS — R040 Epistaxis: Secondary | ICD-10-CM

## 2018-05-16 MED ORDER — OXYMETAZOLINE HCL 0.05 % NA SOLN
1.0000 | Freq: Once | NASAL | Status: AC
  Administered 2018-05-16: 1 via NASAL
  Filled 2018-05-16: qty 15

## 2018-05-16 NOTE — ED Notes (Signed)
ED Provider at bedside. 

## 2018-05-16 NOTE — ED Provider Notes (Signed)
[place] EMERGENCY DEPARTMENT Provider Note   CSN: [id number] Arrival date & time: 05/16/18  1214     History   Chief Complaint Chief Complaint  Patient presents with  . Epistaxis    HPI Kenneth Pope is a 84 y.o. male presenting for evaluation of epistaxis.  Patient states that approximately 2 hours prior to arrival he felt like there something in his nose, went to scratch at it in his right nare started bleeding.  It has been bleeding since. He is on brillanta and ASA s/p stent placement 10 days ago.  Fevers, chills, nasal congestion, sore throat, cough, chest pain, shortness of breath.  He applied ice to his nose without improvement of his bleeding.  He has not tried anything else.  He denies dizziness, weakness, or lightheadedness.  HPI  Past Medical History:  Diagnosis Date  . Arthritis   . CAD (coronary artery disease) 05/04/2018   a. 05/04/18 NSTEMI, 05/05/18 DES to dominant RCA  . Cancer ([place])    skin lesion  . Former tobacco use   . Hyperlipidemia   . Hypertension   . Macular degeneration     Patient Active Problem List   Diagnosis Date Noted  . NSTEMI (non-ST elevation myocardial infarction) ([place]) 05/05/2018  . Elevated troponin   . Syncope 05/04/2018  . Essential hypertension 05/04/2018  . Dyslipidemia 05/04/2018    Past Surgical History:  Procedure Laterality Date  . CORONARY STENT INTERVENTION N/A 05/05/2018   Procedure: CORONARY STENT INTERVENTION;  Surgeon: Troy Sine, MD;  Location: [place] CV LAB;  Service: Cardiovascular;  Laterality: N/A;  . HERNIA REPAIR    . LEFT HEART CATH AND CORONARY ANGIOGRAPHY N/A 05/05/2018   Procedure: LEFT HEART CATH AND CORONARY ANGIOGRAPHY;  Surgeon: Troy Sine, MD;  Location: [place] CV LAB;  Service: Cardiovascular;  Laterality: N/A;        Home Medications    Prior to Admission medications   Medication Sig Start Date End Date Taking? Authorizing Provider    amLODipine (NORVASC) 10 MG tablet Take 1 tablet (10 mg total) by mouth daily. 05/07/18   Daune Perch, NP  aspirin EC 81 MG tablet Take 81 mg by mouth daily.    [provider]  atorvastatin (LIPITOR) 80 MG tablet Take 1 tablet (80 mg total) by mouth daily at 6 PM. 05/13/18   Lendon Colonel, NP  calcium carbonate (OSCAL) 1500 (600 Ca) MG TABS tablet Take 600 mg of elemental calcium by mouth daily.    [provider]  carvedilol (COREG) 3.125 MG tablet Take 1 tablet (3.125 mg total) by mouth 2 (two) times daily with a meal. 05/13/18   Lendon Colonel, NP  COMBIGAN 0.2-0.5 % ophthalmic solution Place 1 drop into both eyes 2 (two) times daily. 04/18/18   [provider]  Multiple Vitamin (MULTIVITAMIN WITH MINERALS) TABS tablet Take 1 tablet by mouth daily.    [provider]  Multiple Vitamins-Minerals (PRESERVISION AREDS 2) CAPS Take 1 capsule by mouth daily.    [provider]  nitroGLYCERIN (NITROSTAT) 0.4 MG SL tablet Place 1 tablet (0.4 mg total) under the tongue every 5 (five) minutes as needed for chest pain. 05/06/18   Daune Perch, NP  omega-3 acid ethyl esters (LOVAZA) 1 g capsule Take 1 g by mouth 2 (two) times daily.    [provider]  ticagrelor (BRILINTA) 90 MG TABS tablet Take 1 tablet (90 mg total) by mouth 2 (two)  times daily. 05/13/18   Lendon Colonel, NP    Family History Family History  Problem Relation Age of Onset  . CAD Neg Hx     Social History Social History   Tobacco Use  . Smoking status: Former Smoker    Years: 15.00    Types: Cigarettes    Last attempt to quit: 1976    Years since quitting: 43.9  . Smokeless tobacco: Never Used  Substance Use Topics  . Alcohol use: Never    Frequency: Never  . Drug use: Never     Allergies   Patient has no known allergies.   Review of Systems Review of Systems  Constitutional: Negative for fever.  HENT: Positive for nosebleeds. Negative  for congestion and sore throat.   Respiratory: Negative for cough and shortness of breath.   Cardiovascular: Negative for chest pain.  Gastrointestinal: Negative for nausea and vomiting.  Allergic/Immunologic: Negative for immunocompromised state.  Neurological: Negative for dizziness and weakness.  Hematological: Bruises/bleeds easily.  Psychiatric/Behavioral: Negative for confusion.     Physical Exam Updated Vital Signs BP 133/84   Pulse (!) 58   Temp 97.7 F (36.5 C) (Oral)   Resp 16   Ht 5\' 8"  (1.727 m)   Wt 78.1 kg   SpO2 97%   BMI 26.18 kg/m   Physical Exam  Constitutional: He is oriented to person, place, and time. He appears well-developed and well-nourished. No distress.  Elderly male sitting comfortably in the bed in no acute distress  HENT:  Head: Normocephalic and atraumatic.  Nose: Epistaxis is observed.  Slow ooze coming from anterior right nare.  Left nare without bleeding.  No blood in the OP.  Eyes: Pupils are equal, round, and reactive to light. Conjunctivae and EOM are normal.  Neck: Normal range of motion. Neck supple.  Cardiovascular: Normal rate, regular rhythm and intact distal pulses.  Pulmonary/Chest: Effort normal and breath sounds normal. No respiratory distress. He has no wheezes.  Abdominal: Soft. He exhibits no distension. There is no tenderness.  Musculoskeletal: Normal range of motion.  Neurological: He is alert and oriented to person, place, and time.  Skin: Skin is warm and dry. Capillary refill takes less than 2 seconds.  Psychiatric: He has a normal mood and affect.  Nursing note and vitals reviewed.    ED Treatments / Results  Labs (all labs ordered are listed, but only abnormal results are displayed) Labs Reviewed - No data to display  EKG None  Radiology No results found.  Procedures Procedures (including critical care time)  Medications Ordered in ED Medications  oxymetazoline (AFRIN) 0.05 % nasal spray 1 spray (1  spray Right Nare Given 05/16/18 1259)     Initial Impression / Assessment and Plan / ED Course  I have reviewed the triage vital signs and the nursing notes.  Pertinent labs & imaging results that were available during my care of the patient were reviewed by me and considered in my medical decision making (see chart for details).     Patient presenting for evaluation of nosebleed.  Physical exam shows a patient who is not tachycardic and not hypotensive.  No symptoms of concerning blood loss, I do not believe he needs labs today.  Bleeding is a slow ooze.  Will trial Afrin soaked gauze and reassess.  After Afrin, bleeding resolved.  Case discussed with attending, Dr. Eulis Foster evaluated the patient.  Patient had recurrence of mild bleeding.  Dr. Eulis Foster applied silver nitrate and bleeding improved.  Discussed with patient home treatment for further nosebleeds, including pressure, ice, and use of Afrin.  Patient to follow-up with PCP as needed.  At this time, patient appears safe for discharge.  Return precautions given.  Patient states he understands and agrees to plan.   Final Clinical Impressions(s) / ED Diagnoses   Final diagnoses:  Epistaxis    ED Discharge Orders    None       Franchot Heidelberg, PA-C 05/16/18 1640    Daleen Bo, MD 05/16/18 1657

## 2018-05-16 NOTE — Discharge Instructions (Signed)
Use afrin 2 times a day.  If bleeding recurs, hold direct pressure and ice for at least 20 minutes.  Continue taking home medications as prescribed.  Try not to irritate or pick at your nose.  Follow up with your primary care doctor as needed for further evaluation.  Return to the ER if you develop persistent bleeding, or any new, worsening, or concerning symptoms.

## 2018-05-16 NOTE — ED Triage Notes (Signed)
Pt reports nose bleed just PTA. Pt on blood thinners. Pt a.o upon arrival, no bleeding noted at this time, right nare packed with tissue

## 2018-05-16 NOTE — ED Provider Notes (Signed)
  Face-to-face evaluation   History: Nosebleeding this morning after he accidentally removed a scab.  Denies fever, rhinorrhea, cough or shortness of breath.  He is recovering from recent back catheterization and stenting.  Physical exam: Alert, calm, cooperative.  Dried blood in right nares, area of anterior septum, cleaned of fracture appearing blood, with mild continual oozing.  This area was treated with silver nitrate, by me, with cessation of bleeding.  She will be discharged with Afrin to use twice daily.  Instructed on use of pressure, Afrin, and Vaseline    .Epistaxis Management Date/Time: 05/16/2018 2:17 PM Performed by: Daleen Bo, MD Authorized by: Daleen Bo, MD   Consent:    Consent obtained:  Verbal   Risks discussed:  Bleeding and pain Anesthesia (see MAR for exact dosages):    Anesthesia method:  None Procedure details:    Treatment site:  R anterior   Treatment method:  Silver nitrate   Treatment complexity:  Limited   Treatment episode: initial   Post-procedure details:    Assessment:  Bleeding stopped   Patient tolerance of procedure:  Tolerated well, no immediate complications    Medical screening examination/treatment/procedure(s) were conducted as a shared visit with non-physician practitioner(s) and myself.  I personally evaluated the patient during the encounter    Daleen Bo, MD 05/16/18 [phone]

## 2018-05-18 ENCOUNTER — Telehealth: Payer: Self-pay | Admitting: Cardiology

## 2018-05-18 DIAGNOSIS — I214 Non-ST elevation (NSTEMI) myocardial infarction: Secondary | ICD-10-CM

## 2018-05-18 NOTE — Telephone Encounter (Signed)
Referral for cardiac rehab ordered

## 2018-05-18 NOTE — Telephone Encounter (Signed)
Please advise if patient needs to have cardiac rehab referral.  Thanks!

## 2018-05-18 NOTE — Telephone Encounter (Signed)
New Message:     Pt says he needs to be scheduled for Cardiac Rehab please.

## 2018-05-21 NOTE — Telephone Encounter (Signed)
Pt called and stated he would like to join the CR program here at [facility]. Patient will come in for orientation on 06/16/18 @ 8AM and will attend the 6:45AM exercise class.  Mailed homework package.

## 2018-06-01 ENCOUNTER — Telehealth (HOSPITAL_COMMUNITY): Payer: Self-pay | Admitting: Pharmacist

## 2018-06-01 NOTE — Telephone Encounter (Signed)
Cardiac Rehab Medication Review by a Pharmacist  Does the patient  feel that his/her medications are working for him/her?  yes  Has the patient been experiencing any side effects to the medications prescribed?  no  Does the patient measure his/her own blood pressure or blood glucose at home?  yes  Last reading patient took was on 05/21/2018 - BP 133/80 and HR 62  Does the patient have any problems obtaining medications due to transportation or finances? no  Understanding of regimen: excellent Understanding of indications: excellent Potential of compliance: excellent  Pharmacist comments: Patient has no complaints with current medications and is taking them as prescribed. Patient reported that he is currently taking timolol and brimonidine instead of combination product, Combigan, at this time. Patient reports no issues obtaining medications at this time.   Thank you for allowing pharmacy to be a part of this patient's care.  Leron Croak, PharmD PGY1 Pharmacy Resident Phone: [phone] 06/01/2018 12:46 PM

## 2018-06-08 ENCOUNTER — Encounter (INDEPENDENT_AMBULATORY_CARE_PROVIDER_SITE_OTHER): Payer: Medicare Other | Admitting: Ophthalmology

## 2018-06-08 DIAGNOSIS — H353221 Exudative age-related macular degeneration, left eye, with active choroidal neovascularization: Secondary | ICD-10-CM | POA: Diagnosis not present

## 2018-06-08 DIAGNOSIS — I10 Essential (primary) hypertension: Secondary | ICD-10-CM | POA: Diagnosis not present

## 2018-06-08 DIAGNOSIS — H35033 Hypertensive retinopathy, bilateral: Secondary | ICD-10-CM

## 2018-06-08 DIAGNOSIS — H43813 Vitreous degeneration, bilateral: Secondary | ICD-10-CM

## 2018-06-08 DIAGNOSIS — H33301 Unspecified retinal break, right eye: Secondary | ICD-10-CM

## 2018-06-08 DIAGNOSIS — H353112 Nonexudative age-related macular degeneration, right eye, intermediate dry stage: Secondary | ICD-10-CM

## 2018-06-11 DIAGNOSIS — R972 Elevated prostate specific antigen [PSA]: Secondary | ICD-10-CM | POA: Diagnosis not present

## 2018-06-11 DIAGNOSIS — N401 Enlarged prostate with lower urinary tract symptoms: Secondary | ICD-10-CM | POA: Diagnosis not present

## 2018-06-11 DIAGNOSIS — R3914 Feeling of incomplete bladder emptying: Secondary | ICD-10-CM | POA: Diagnosis not present

## 2018-06-11 DIAGNOSIS — R311 Benign essential microscopic hematuria: Secondary | ICD-10-CM | POA: Diagnosis not present

## 2018-06-15 NOTE — Progress Notes (Signed)
Erez Gene Churchwell 84 y.o. male DOB 09/02/1933 MRN [id number]       Nutrition  No diagnosis found. Past Medical History:  Diagnosis Date  . Arthritis   . CAD (coronary artery disease) 05/04/2018   a. 05/04/18 NSTEMI, 05/05/18 DES to dominant RCA  . Cancer ([place])    skin lesion  . Former tobacco use   . Hyperlipidemia   . Hypertension   . Macular degeneration    Meds reviewed.    Current Outpatient Medications (Cardiovascular):  .  amLODipine (NORVASC) 10 MG tablet, Take 1 tablet (10 mg total) by mouth daily. Marland Kitchen  atorvastatin (LIPITOR) 80 MG tablet, Take 1 tablet (80 mg total) by mouth daily at 6 PM. .  carvedilol (COREG) 3.125 MG tablet, Take 1 tablet (3.125 mg total) by mouth 2 (two) times daily with a meal. .  nitroGLYCERIN (NITROSTAT) 0.4 MG SL tablet, Place 1 tablet (0.4 mg total) under the tongue every 5 (five) minutes as needed for chest pain. Marland Kitchen  omega-3 acid ethyl esters (LOVAZA) 1 g capsule, Take 1 g by mouth 2 (two) times daily.   Current Outpatient Medications (Analgesics):  .  aspirin EC 81 MG tablet, Take 81 mg by mouth daily.  Current Outpatient Medications (Hematological):  .  ticagrelor (BRILINTA) 90 MG TABS tablet, Take 1 tablet (90 mg total) by mouth 2 (two) times daily.  Current Outpatient Medications (Other):  .  brimonidine (ALPHAGAN) 0.2 % ophthalmic solution, Place 1 drop into both eyes 2 (two) times daily. .  calcium carbonate (OSCAL) 1500 (600 Ca) MG TABS tablet, Take 600 mg of elemental calcium by mouth daily. .  COMBIGAN 0.2-0.5 % ophthalmic solution, Place 1 drop into both eyes 2 (two) times daily. .  Multiple Vitamin (MULTIVITAMIN WITH MINERALS) TABS tablet, Take 1 tablet by mouth daily. .  Multiple Vitamins-Minerals (PRESERVISION AREDS 2) CAPS, Take 1 capsule by mouth 2 (two) times daily.  .  timolol (TIMOPTIC) 0.5 % ophthalmic solution, Place 1 drop into both eyes 2 (two) times daily.   HT: Ht Readings from Last 1 Encounters:  05/16/18 5\' 8"   (1.727 m)    WT: Wt Readings from Last 5 Encounters:  05/16/18 172 lb 2.9 oz (78.1 kg)  05/13/18 172 lb 3.2 oz (78.1 kg)  05/06/18 169 lb 12.1 oz (77 kg)     BMI= 26.19 (05/13/18)   Current tobacco use? No       Labs:  Lipid Panel     Component Value Date/Time   CHOL 135 05/05/2018 0219   TRIG 51 05/05/2018 0219   HDL 56 05/05/2018 0219   CHOLHDL 2.4 05/05/2018 0219   VLDL 10 05/05/2018 0219   LDLCALC 69 05/05/2018 0219    Lab Results  Component Value Date   HGBA1C 5.7 (H) 05/04/2018   CBG (last 3)  No results for input(s): GLUCAP in the last 72 hours.  Nutrition Diagnosis ? Food-and nutrition-related knowledge deficit related to lack of exposure to information as related to diagnosis of: ? CVD   Nutrition Goal(s):  ? To be determined  Plan:  Pt to attend nutrition classes ? Nutrition I ? Nutrition II ? Portion Distortion  ? Diabetes Blitz ? Diabetes Q & A Will provide client-centered nutrition education as part of interdisciplinary care.   Monitor and evaluate progress toward nutrition goal with team.  Laurina Bustle, MS, RD, LDN 06/15/2018 10:00 AM

## 2018-06-16 ENCOUNTER — Encounter (HOSPITAL_COMMUNITY)
Admission: RE | Admit: 2018-06-16 | Discharge: 2018-06-16 | Disposition: A | Discharge status: Home / Self Care | Payer: Medicare Other | Source: Ambulatory Visit | Attending: Cardiology | Admitting: Cardiology

## 2018-06-16 ENCOUNTER — Encounter (HOSPITAL_COMMUNITY): Payer: Self-pay

## 2018-06-16 VITALS — BP 120/60 | HR 72 | Ht 68.0 in | Wt 170.6 lb

## 2018-06-16 DIAGNOSIS — I214 Non-ST elevation (NSTEMI) myocardial infarction: Secondary | ICD-10-CM

## 2018-06-16 DIAGNOSIS — Z955 Presence of coronary angioplasty implant and graft: Secondary | ICD-10-CM | POA: Diagnosis not present

## 2018-06-16 NOTE — Progress Notes (Signed)
Cardiac Individual Treatment Plan  Patient Details  Name: Kenneth Pope MRN: [id number] Date of Birth: 1933-07-24 Referring Provider:     CARDIAC REHAB PHASE II ORIENTATION from 06/16/2018 in [place]  Referring Provider  Dr. Percival Spanish      Initial Encounter Date:    CARDIAC REHAB PHASE II ORIENTATION from 06/16/2018 in [facility]  Date  06/16/18      Visit Diagnosis: 05/04/2018 NSTEMI (non-ST elevated myocardial infarction) ([place])  05/05/2018 Stented coronary artery S/P DES RCA  Patient's Home Medications on Admission:  Current Outpatient Medications:  .  amLODipine (NORVASC) 10 MG tablet, Take 1 tablet (10 mg total) by mouth daily., Disp: 90 tablet, Rfl: 3 .  aspirin EC 81 MG tablet, Take 81 mg by mouth daily., Disp: , Rfl:  .  atorvastatin (LIPITOR) 80 MG tablet, Take 1 tablet (80 mg total) by mouth daily at 6 PM., Disp: 90 tablet, Rfl: 3 .  brimonidine (ALPHAGAN) 0.2 % ophthalmic solution, Place 1 drop into both eyes 2 (two) times daily., Disp: , Rfl:  .  calcium carbonate (OSCAL) 1500 (600 Ca) MG TABS tablet, Take 600 mg of elemental calcium by mouth daily., Disp: , Rfl:  .  carvedilol (COREG) 3.125 MG tablet, Take 1 tablet (3.125 mg total) by mouth 2 (two) times daily with a meal., Disp: 90 tablet, Rfl: 3 .  COMBIGAN 0.2-0.5 % ophthalmic solution, Place 1 drop into both eyes 2 (two) times daily., Disp: , Rfl: 3 .  Multiple Vitamin (MULTIVITAMIN WITH MINERALS) TABS tablet, Take 1 tablet by mouth daily., Disp: , Rfl:  .  Multiple Vitamins-Minerals (PRESERVISION AREDS 2) CAPS, Take 1 capsule by mouth 2 (two) times daily. , Disp: , Rfl:  .  nitroGLYCERIN (NITROSTAT) 0.4 MG SL tablet, Place 1 tablet (0.4 mg total) under the tongue every 5 (five) minutes as needed for chest pain., Disp: 25 tablet, Rfl: 12 .  omega-3 acid ethyl esters (LOVAZA) 1 g capsule, Take 1 g by mouth 2 (two) times daily., Disp: , Rfl:  .   ticagrelor (BRILINTA) 90 MG TABS tablet, Take 1 tablet (90 mg total) by mouth 2 (two) times daily., Disp: 180 tablet, Rfl: 3 .  timolol (TIMOPTIC) 0.5 % ophthalmic solution, Place 1 drop into both eyes 2 (two) times daily., Disp: , Rfl:   Past Medical History: Past Medical History:  Diagnosis Date  . Arthritis   . CAD (coronary artery disease) 05/04/2018   a. 05/04/18 NSTEMI, 05/05/18 DES to dominant RCA  . Cancer ([place])    skin lesion  . Former tobacco use   . Hyperlipidemia   . Hypertension   . Macular degeneration     Tobacco Use: Social History   Tobacco Use  Smoking Status Former Smoker  . Years: 15.00  . Types: Cigarettes  . Last attempt to quit: 1976  . Years since quitting: 44.0  Smokeless Tobacco Never Used    Labs: Recent Chemical engineer    Labs for ITP Cardiac and Pulmonary Rehab Latest Ref Rng & Units 05/04/2018 05/05/2018   Cholestrol 0 - 200 mg/dL - 135   LDLCALC 0 - 99 mg/dL - 69   HDL >40 mg/dL - 56   Trlycerides <150 mg/dL - 51   Hemoglobin A1c 4.8 - 5.6 % 5.7(H) -      Capillary Blood Glucose: No results found for: GLUCAP   Exercise Target Goals: Exercise Program Goal: Individual exercise prescription set using results  from initial 6 min walk test and THRR while considering  patient's activity barriers and safety.   Exercise Prescription Goal: Initial exercise prescription builds to 30-45 minutes a day of aerobic activity, 2-3 days per week.  Home exercise guidelines will be given to patient during program as part of exercise prescription that the participant will acknowledge.  Activity Barriers & Risk Stratification: Activity Barriers & Cardiac Risk Stratification - 06/16/18 1109      Activity Barriers & Cardiac Risk Stratification   Activity Barriers  None    Cardiac Risk Stratification  High       6 Minute Walk: 6 Minute Walk    Row Name 06/16/18 1108         6 Minute Walk   Phase  Initial     Distance  1255 feet     Walk  Time  6 minutes     # of Rest Breaks  0     MPH  2.38     METS  2.33     RPE  9     VO2 Peak  8.14     Symptoms  No     Resting HR  72 bpm     Resting BP  120/60     Resting Oxygen Saturation   97 %     Exercise Oxygen Saturation  during 6 min walk  97 %     Max Ex. HR  106 bpm     Max Ex. BP  130/70     2 Minute Post BP  110/72        Oxygen Initial Assessment:   Oxygen Re-Evaluation:   Oxygen Discharge (Final Oxygen Re-Evaluation):   Initial Exercise Prescription: Initial Exercise Prescription - 06/16/18 1100      Date of Initial Exercise RX and Referring Provider   Date  06/16/18    Referring Provider  Dr. Percival Spanish    Expected Discharge Date  09/25/18      Treadmill   MPH  2.2    Grade  2    Minutes  10      Bike   Level  1.5    Watts  8    Minutes  10    METs  2.3      NuStep   Level  3    SPM  75    Minutes  10    METs  2.3      Prescription Details   Frequency (times per week)  3    Duration  Progress to 30 minutes of continuous aerobic without signs/symptoms of physical distress      Intensity   THRR 40-80% of Max Heartrate  55-110    Ratings of Perceived Exertion  11-13      Progression   Progression  Continue to progress workloads to maintain intensity without signs/symptoms of physical distress.      Resistance Training   Training Prescription  Yes    Weight  3 lbs.    Reps  10-15       Perform Capillary Blood Glucose checks as needed.  Exercise Prescription Changes:   Exercise Comments:   Exercise Goals and Review: Exercise Goals    Row Name 06/16/18 1109             Exercise Goals   Increase Physical Activity  Yes       Intervention  Provide advice, education, support and counseling about physical activity/exercise needs.;Develop an individualized exercise prescription for  aerobic and resistive training based on initial evaluation findings, risk stratification, comorbidities and participant's personal goals.        Expected Outcomes  Short Term: Attend rehab on a regular basis to increase amount of physical activity.       Increase Strength and Stamina  Yes       Intervention  Provide advice, education, support and counseling about physical activity/exercise needs.;Develop an individualized exercise prescription for aerobic and resistive training based on initial evaluation findings, risk stratification, comorbidities and participant's personal goals.       Expected Outcomes  Short Term: Increase workloads from initial exercise prescription for resistance, speed, and METs.       Able to understand and use rate of perceived exertion (RPE) scale  Yes       Intervention  Provide education and explanation on how to use RPE scale       Expected Outcomes  Short Term: Able to use RPE daily in rehab to express subjective intensity level;Long Term:  Able to use RPE to guide intensity level when exercising independently       Knowledge and understanding of Target Heart Rate Range (THRR)  Yes       Intervention  Provide education and explanation of THRR including how the numbers were predicted and where they are located for reference       Expected Outcomes  Short Term: Able to state/look up THRR;Long Term: Able to use THRR to govern intensity when exercising independently;Short Term: Able to use daily as guideline for intensity in rehab       Able to check pulse independently  Yes       Intervention  Provide education and demonstration on how to check pulse in carotid and radial arteries.;Review the importance of being able to check your own pulse for safety during independent exercise       Expected Outcomes  Short Term: Able to explain why pulse checking is important during independent exercise;Long Term: Able to check pulse independently and accurately       Understanding of Exercise Prescription  Yes       Intervention  Provide education, explanation, and written materials on patient's individual exercise prescription        Expected Outcomes  Short Term: Able to explain program exercise prescription;Long Term: Able to explain home exercise prescription to exercise independently          Exercise Goals Re-Evaluation :   Discharge Exercise Prescription (Final Exercise Prescription Changes):   Nutrition:  Target Goals: Understanding of nutrition guidelines, daily intake of sodium 1500mg , cholesterol 200mg , calories 30% from fat and 7% or less from saturated fats, daily to have 5 or more servings of fruits and vegetables.  Biometrics: Pre Biometrics - 06/16/18 1110      Pre Biometrics   Height  5\' 8"  (1.727 m)    Weight  77.4 kg    Waist Circumference  40 inches    Hip Circumference  40 inches    Waist to Hip Ratio  1 %    BMI (Calculated)  25.95    Triceps Skinfold  30 mm    % Body Fat  30.3 %    Grip Strength  27 kg    Flexibility  11 in    Single Leg Stand  12.37 seconds        Nutrition Therapy Plan and Nutrition Goals:   Nutrition Assessments:   Nutrition Goals Re-Evaluation:   Nutrition Goals Re-Evaluation:  Nutrition Goals Discharge (Final Nutrition Goals Re-Evaluation):   Psychosocial: Target Goals: Acknowledge presence or absence of significant depression and/or stress, maximize coping skills, provide positive support system. Participant is able to verbalize types and ability to use techniques and skills needed for reducing stress and depression.  Initial Review & Psychosocial Screening: Initial Psych Review & Screening - 06/16/18 1022      Initial Review   Current issues with  None Identified      Family Dynamics   Good Support System?  Yes   wife     Barriers   Psychosocial barriers to participate in program  There are no identifiable barriers or psychosocial needs.      Screening Interventions   Interventions  Encouraged to exercise       Quality of Life Scores: Quality of Life - 06/16/18 1023      Quality of Life   Select  Quality of Life       Quality of Life Scores   Health/Function Pre  27.1 %    Socioeconomic Pre  29.1 %    Psych/Spiritual Pre  24.6 %    Family Pre  27.6 %    GLOBAL Pre  27.1 %      Scores of 19 and below usually indicate a poorer quality of life in these areas.  A difference of  2-3 points is a clinically meaningful difference.  A difference of 2-3 points in the total score of the Quality of Life Index has been associated with significant improvement in overall quality of life, self-image, physical symptoms, and general health in studies assessing change in quality of life.  PHQ-9: Recent Review Flowsheet Data    There is no flowsheet data to display.     Interpretation of Total Score  Total Score Depression Severity:  1-4 = Minimal depression, 5-9 = Mild depression, 10-14 = Moderate depression, 15-19 = Moderately severe depression, 20-27 = Severe depression   Psychosocial Evaluation and Intervention:   Psychosocial Re-Evaluation:   Psychosocial Discharge (Final Psychosocial Re-Evaluation):   Vocational Rehabilitation: Provide vocational rehab assistance to qualifying candidates.   Vocational Rehab Evaluation & Intervention: Vocational Rehab - 06/16/18 1023      Initial Vocational Rehab Evaluation & Intervention   Assessment shows need for Vocational Rehabilitation  No   retired       Education: Education Goals: Education classes will be provided on a weekly basis, covering required topics. Participant will state understanding/return demonstration of topics presented.  Learning Barriers/Preferences: Learning Barriers/Preferences - 06/16/18 1110      Learning Barriers/Preferences   Learning Barriers  Sight    Learning Preferences  Video;Skilled Demonstration;Individual Instruction       Education Topics: Count Your Pulse:  -Group instruction provided by verbal instruction, demonstration, patient participation and written materials to support subject.  Instructors address importance  of being able to find your pulse and how to count your pulse when at home without a heart monitor.  Patients get hands on experience counting their pulse with staff help and individually.   Heart Attack, Angina, and Risk Factor Modification:  -Group instruction provided by verbal instruction, video, and written materials to support subject.  Instructors address signs and symptoms of angina and heart attacks.    Also discuss risk factors for heart disease and how to make changes to improve heart health risk factors.   Functional Fitness:  -Group instruction provided by verbal instruction, demonstration, patient participation, and written materials to support subject.  Instructors address  safety measures for doing things around the house.  Discuss how to get up and down off the floor, how to pick things up properly, how to safely get out of a chair without assistance, and balance training.   Meditation and Mindfulness:  -Group instruction provided by verbal instruction, patient participation, and written materials to support subject.  Instructor addresses importance of mindfulness and meditation practice to help reduce stress and improve awareness.  Instructor also leads participants through a meditation exercise.    Stretching for Flexibility and Mobility:  -Group instruction provided by verbal instruction, patient participation, and written materials to support subject.  Instructors lead participants through series of stretches that are designed to increase flexibility thus improving mobility.  These stretches are additional exercise for major muscle groups that are typically performed during regular warm up and cool down.   Hands Only CPR:  -Group verbal, video, and participation provides a basic overview of AHA guidelines for community CPR. Role-play of emergencies allow participants the opportunity to practice calling for help and chest compression technique with discussion of AED  use.   Hypertension: -Group verbal and written instruction that provides a basic overview of hypertension including the most recent diagnostic guidelines, risk factor reduction with self-care instructions and medication management.    Nutrition I class: Heart Healthy Eating:  -Group instruction provided by PowerPoint slides, verbal discussion, and written materials to support subject matter. The instructor gives an explanation and review of the Therapeutic Lifestyle Changes diet recommendations, which includes a discussion on lipid goals, dietary fat, sodium, fiber, plant stanol/sterol esters, sugar, and the components of a well-balanced, healthy diet.   Nutrition II class: Lifestyle Skills:  -Group instruction provided by PowerPoint slides, verbal discussion, and written materials to support subject matter. The instructor gives an explanation and review of label reading, grocery shopping for heart health, heart healthy recipe modifications, and ways to make healthier choices when eating out.   Diabetes Question & Answer:  -Group instruction provided by PowerPoint slides, verbal discussion, and written materials to support subject matter. The instructor gives an explanation and review of diabetes co-morbidities, pre- and post-prandial blood glucose goals, pre-exercise blood glucose goals, signs, symptoms, and treatment of hypoglycemia and hyperglycemia, and foot care basics.   Diabetes Blitz:  -Group instruction provided by PowerPoint slides, verbal discussion, and written materials to support subject matter. The instructor gives an explanation and review of the physiology behind type 1 and type 2 diabetes, diabetes medications and rational behind using different medications, pre- and post-prandial blood glucose recommendations and Hemoglobin A1c goals, diabetes diet, and exercise including blood glucose guidelines for exercising safely.    Portion Distortion:  -Group instruction provided by  PowerPoint slides, verbal discussion, written materials, and food models to support subject matter. The instructor gives an explanation of serving size versus portion size, changes in portions sizes over the last 20 years, and what consists of a serving from each food group.   Stress Management:  -Group instruction provided by verbal instruction, video, and written materials to support subject matter.  Instructors review role of stress in heart disease and how to cope with stress positively.     Exercising on Your Own:  -Group instruction provided by verbal instruction, power point, and written materials to support subject.  Instructors discuss benefits of exercise, components of exercise, frequency and intensity of exercise, and end points for exercise.  Also discuss use of nitroglycerin and activating EMS.  Review options of places to exercise outside of rehab.  Review guidelines for sex with heart disease.   Cardiac Drugs I:  -Group instruction provided by verbal instruction and written materials to support subject.  Instructor reviews cardiac drug classes: antiplatelets, anticoagulants, beta blockers, and statins.  Instructor discusses reasons, side effects, and lifestyle considerations for each drug class.   Cardiac Drugs II:  -Group instruction provided by verbal instruction and written materials to support subject.  Instructor reviews cardiac drug classes: angiotensin converting enzyme inhibitors (ACE-I), angiotensin II receptor blockers (ARBs), nitrates, and calcium channel blockers.  Instructor discusses reasons, side effects, and lifestyle considerations for each drug class.   Anatomy and Physiology of the Circulatory System:  Group verbal and written instruction and models provide basic cardiac anatomy and physiology, with the coronary electrical and arterial systems. Review of: AMI, Angina, Valve disease, Heart Failure, Peripheral Artery Disease, Cardiac Arrhythmia, Pacemakers, and  the ICD.   Other Education:  -Group or individual verbal, written, or video instructions that support the educational goals of the cardiac rehab program.   Holiday Eating Survival Tips:  -Group instruction provided by PowerPoint slides, verbal discussion, and written materials to support subject matter. The instructor gives patients tips, tricks, and techniques to help them not only survive but enjoy the holidays despite the onslaught of food that accompanies the holidays.   Knowledge Questionnaire Score: Knowledge Questionnaire Score - 06/16/18 1023      Knowledge Questionnaire Score   Pre Score  21/24       Core Components/Risk Factors/Patient Goals at Admission: Personal Goals and Risk Factors at Admission - 06/16/18 1111      Core Components/Risk Factors/Patient Goals on Admission    Weight Management  Yes;Weight Maintenance    Intervention  Weight Management: Develop a combined nutrition and exercise program designed to reach desired caloric intake, while maintaining appropriate intake of nutrient and fiber, sodium and fats, and appropriate energy expenditure required for the weight goal.;Weight Management: Provide education and appropriate resources to help participant work on and attain dietary goals.    Admit Weight  170 lb 10.2 oz (77.4 kg)    Expected Outcomes  Short Term: Continue to assess and modify interventions until short term weight is achieved;Long Term: Adherence to nutrition and physical activity/exercise program aimed toward attainment of established weight goal;Weight Maintenance: Understanding of the daily nutrition guidelines, which includes 25-35% calories from fat, 7% or less cal from saturated fats, less than 200mg  cholesterol, less than 1.5gm of sodium, & 5 or more servings of fruits and vegetables daily;Understanding recommendations for meals to include 15-35% energy as protein, 25-35% energy from fat, 35-60% energy from carbohydrates, less than 200mg  of dietary  cholesterol, 20-35 gm of total fiber daily;Understanding of distribution of calorie intake throughout the day with the consumption of 4-5 meals/snacks    Hypertension  Yes    Intervention  Provide education on lifestyle modifcations including regular physical activity/exercise, weight management, moderate sodium restriction and increased consumption of fresh fruit, vegetables, and low fat dairy, alcohol moderation, and smoking cessation.;Monitor prescription use compliance.    Expected Outcomes  Long Term: Maintenance of blood pressure at goal levels.;Short Term: Continued assessment and intervention until BP is < 140/46mm HG in hypertensive participants. < 130/40mm HG in hypertensive participants with diabetes, heart failure or chronic kidney disease.    Lipids  Yes    Intervention  Provide education and support for participant on nutrition & aerobic/resistive exercise along with prescribed medications to achieve LDL 70mg , HDL >40mg .    Expected Outcomes  Short Term:  Participant states understanding of desired cholesterol values and is compliant with medications prescribed. Participant is following exercise prescription and nutrition guidelines.;Long Term: Cholesterol controlled with medications as prescribed, with individualized exercise RX and with personalized nutrition plan. Value goals: LDL < 70mg , HDL > 40 mg.    Stress  Yes    Intervention  Offer individual and/or small group education and counseling on adjustment to heart disease, stress management and health-related lifestyle change. Teach and support self-help strategies.;Refer participants experiencing significant psychosocial distress to appropriate mental health specialists for further evaluation and treatment. When possible, include family members and significant others in education/counseling sessions.    Expected Outcomes  Short Term: Participant demonstrates changes in health-related behavior, relaxation and other stress management  skills, ability to obtain effective social support, and compliance with psychotropic medications if prescribed.;Long Term: Emotional wellbeing is indicated by absence of clinically significant psychosocial distress or social isolation.       Core Components/Risk Factors/Patient Goals Review:    Core Components/Risk Factors/Patient Goals at Discharge (Final Review):    ITP Comments: ITP Comments    Row Name 06/16/18 0749           ITP Comments  Dr. Fransico Him, Medical Director           Comments: Gene attended orientation from 0740 to [phone] to review rules and guidelines for program. Completed 6 minute walk test, Intitial ITP, and exercise prescription.  VSS. Telemetry-Sinus Rhythm.  Asymptomatic.Barnet Pall, RN,BSN 06/16/2018 12:04 PM

## 2018-06-16 NOTE — Progress Notes (Signed)
Kenneth Pope 84 y.o. male DOB: July 21, 1933 MRN: [id number]      Nutrition Note  1. 05/04/2018 NSTEMI (non-ST elevated myocardial infarction) ([place])   2. 05/05/2018 Stented coronary artery S/P DES RCA    Past Medical History:  Diagnosis Date  . Arthritis   . CAD (coronary artery disease) 05/04/2018   a. 05/04/18 NSTEMI, 05/05/18 DES to dominant RCA  . Cancer ([place])    skin lesion  . Former tobacco use   . Hyperlipidemia   . Hypertension   . Macular degeneration    Meds reviewed.    Current Outpatient Medications (Cardiovascular):  .  amLODipine (NORVASC) 10 MG tablet, Take 1 tablet (10 mg total) by mouth daily. Marland Kitchen  atorvastatin (LIPITOR) 80 MG tablet, Take 1 tablet (80 mg total) by mouth daily at 6 PM. .  carvedilol (COREG) 3.125 MG tablet, Take 1 tablet (3.125 mg total) by mouth 2 (two) times daily with a meal. .  nitroGLYCERIN (NITROSTAT) 0.4 MG SL tablet, Place 1 tablet (0.4 mg total) under the tongue every 5 (five) minutes as needed for chest pain. Marland Kitchen  omega-3 acid ethyl esters (LOVAZA) 1 g capsule, Take 1 g by mouth 2 (two) times daily.   Current Outpatient Medications (Analgesics):  .  aspirin EC 81 MG tablet, Take 81 mg by mouth daily.  Current Outpatient Medications (Hematological):  .  ticagrelor (BRILINTA) 90 MG TABS tablet, Take 1 tablet (90 mg total) by mouth 2 (two) times daily.  Current Outpatient Medications (Other):  .  brimonidine (ALPHAGAN) 0.2 % ophthalmic solution, Place 1 drop into both eyes 2 (two) times daily. .  calcium carbonate (OSCAL) 1500 (600 Ca) MG TABS tablet, Take 600 mg of elemental calcium by mouth daily. .  COMBIGAN 0.2-0.5 % ophthalmic solution, Place 1 drop into both eyes 2 (two) times daily. .  Multiple Vitamin (MULTIVITAMIN WITH MINERALS) TABS tablet, Take 1 tablet by mouth daily. .  Multiple Vitamins-Minerals (PRESERVISION AREDS 2) CAPS, Take 1 capsule by mouth 2 (two) times daily.  .  timolol (TIMOPTIC) 0.5 % ophthalmic solution,  Place 1 drop into both eyes 2 (two) times daily.   HT: Ht Readings from Last 1 Encounters:  06/16/18 5\' 8"  (1.727 m)    WT: Wt Readings from Last 5 Encounters:  06/16/18 170 lb 10.2 oz (77.4 kg)  05/16/18 172 lb 2.9 oz (78.1 kg)  05/13/18 172 lb 3.2 oz (78.1 kg)  05/06/18 169 lb 12.1 oz (77 kg)     Body mass index is 25.95 kg/m.   Current tobacco use? No  Labs:  Lipid Panel     Component Value Date/Time   CHOL 135 05/05/2018 0219   TRIG 51 05/05/2018 0219   HDL 56 05/05/2018 0219   CHOLHDL 2.4 05/05/2018 0219   VLDL 10 05/05/2018 0219   LDLCALC 69 05/05/2018 0219    Lab Results  Component Value Date   HGBA1C 5.7 (H) 05/04/2018   CBG (last 3)  No results for input(s): GLUCAP in the last 72 hours.  Nutrition Note Spoke with pt. Nutrition plan and goals reviewed with pt. Pt is following Step 2 of the Therapeutic Lifestyle Changes diet. Pt wants to lose wt, stated he would not mind losing 2-3 lbs. Pt shared he doesn't have a strong desire for weight change. Pt has not been trying to lose wt. Heart healthy, prediabetic, weight loss tips reviewed (label reading, how to build a healthy plate, portion sizes, eating frequently across the day). Pt last A1c  is in a prediabetic range, indicates blood glucose elevated. Reviewed the difference between simple and complex carbs with patient. Recommended pt replace simple carbs in diet with more complex carbs. Set goal with patient to label read and begin to reduce the amount of simple carbs, saturated fat, sodium and trans fats. Per discussion, pt does not use canned foods often. Does eat convenience foods, specifically sweets often. Recommended pt gradually reduce the amount consumed over time to help with management of his blood glucose. Pt does not add salt to food. Pt does not eat out frequently. Pt expressed understanding of the information reviewed. Pt aware of nutrition education classes offered and would no like to attend nutrition  classes.  Nutrition Diagnosis ? Food-and nutrition-related knowledge deficit related to lack of exposure to information as related to diagnosis of: ? CVD ? Pre-diabetes ? Overweight  related to excessive energy intake as evidenced by a Body mass index is 25.95 kg/m.  Nutrition Intervention ? Pt's individual nutrition plan and goals reviewed with pt. ? Pt given handouts for: ? Nutrition I class ? Nutrition II class   ? pre-diabetes  Nutrition Goal(s):  ? Pt to identify and limit food sources of saturated fat, trans fat, refined carbohydrates and sodium ? Pt to identify food quantities necessary to achieve weight loss of 2-6 lbs. at graduation from cardiac rehab. ? Pt able to name foods that affect blood glucose.   Plan:  ? Pt to attend nutrition classes ? Nutrition I ? Nutrition II ? Portion Distortion  ? Will provide client-centered nutrition education as part of interdisciplinary care ? Monitor and evaluate progress toward nutrition goal with team.   Laurina Bustle, MS, RD, LDN 06/16/2018 11:56 AM

## 2018-06-22 ENCOUNTER — Encounter (HOSPITAL_COMMUNITY)
Admission: RE | Admit: 2018-06-22 | Discharge: 2018-06-22 | Disposition: A | Discharge status: Home / Self Care | Payer: Medicare Other | Source: Ambulatory Visit | Attending: Cardiology | Admitting: Cardiology

## 2018-06-22 DIAGNOSIS — I214 Non-ST elevation (NSTEMI) myocardial infarction: Secondary | ICD-10-CM | POA: Insufficient documentation

## 2018-06-22 DIAGNOSIS — Z955 Presence of coronary angioplasty implant and graft: Secondary | ICD-10-CM

## 2018-06-22 NOTE — Progress Notes (Signed)
Daily Session Note  Patient Details  Name: Kenneth Pope MRN: [id number] Date of Birth: [birth year] Referring Provider:     CARDIAC REHAB PHASE II ORIENTATION from 06/16/2018 in [place]  Referring Provider  Dr. Percival Spanish      Encounter Date: 06/22/2018  Check In: Session Check In - 06/22/18 0708      Check-In   Supervising physician immediately available to respond to emergencies  [facility] immediately available    Physician(s)  Dr. Tana Coast     Location  [facility]    Staff Present  Andi Hence, RN, Toma Deiters, RN, Mosie Epstein, MS,ACSM CEP, Exercise Physiologist    Medication changes reported      No    Fall or balance concerns reported     No    Tobacco Cessation  No Change    Warm-up and Cool-down  Performed as group-led instruction    Resistance Training Performed  Yes    VAD Patient?  No    PAD/SET Patient?  No      Pain Assessment   Currently in Pain?  No/denies       Capillary Blood Glucose: No results found for this or any previous visit (from the past 24 hour(s)).  Exercise Prescription Changes - 06/22/18 0700      Response to Exercise   Blood Pressure (Admit)  100/70    Blood Pressure (Exercise)  134/70    Blood Pressure (Exit)  108/60    Heart Rate (Admit)  68 bpm    Heart Rate (Exercise)  94 bpm    Heart Rate (Exit)  72 bpm    Rating of Perceived Exertion (Exercise)  11    Perceived Dyspnea (Exercise)  0    Symptoms  None    Comments  Pt oriented to exercise equipment    Duration  Progress to 30 minutes of  aerobic without signs/symptoms of physical distress    Intensity  THRR unchanged      Progression   Progression  Continue to progress workloads to maintain intensity without signs/symptoms of physical distress.    Average METs  2.63      Resistance Training   Training Prescription  Yes    Weight  3lbs    Reps  10-15    Time  10 Minutes      Treadmill   MPH  2.2    Grade   2    Minutes  10    METs  3.29      Bike   Level  1.5    Minutes  10    METs  2      NuStep   Level  3    SPM  85    Minutes  10    METs  2.6       Social History   Tobacco Use  Smoking Status Former Smoker  . Years: 15.00  . Types: Cigarettes  . Last attempt to quit: 1976  . Years since quitting: 44.0  Smokeless Tobacco Never Used    Goals Met:  Exercise tolerated well  Goals Unmet:  Not Applicable  Comments: Pt started cardiac rehab today.  Pt tolerated light exercise without difficulty. VSS, telemetry-SR, asymptomatic.  Medication list reconciled. Pt denies barriers to medicaiton compliance.  PSYCHOSOCIAL ASSESSMENT:  PHQ-0. Pt exhibits positive coping skills, hopeful outlook with supportive family. No psychosocial needs identified at this time, no psychosocial interventions necessary.  Pt oriented to  exercise equipment and routine.  Understanding verbalized.   Dr. Fransico Him is Medical Director for Cardiac Rehab at [facility].

## 2018-06-24 ENCOUNTER — Encounter (HOSPITAL_COMMUNITY)
Admission: RE | Admit: 2018-06-24 | Discharge: 2018-06-24 | Disposition: A | Discharge status: Home / Self Care | Payer: Medicare Other | Source: Ambulatory Visit | Attending: Cardiology | Admitting: Cardiology

## 2018-06-24 DIAGNOSIS — I214 Non-ST elevation (NSTEMI) myocardial infarction: Secondary | ICD-10-CM | POA: Diagnosis not present

## 2018-06-24 DIAGNOSIS — Z955 Presence of coronary angioplasty implant and graft: Secondary | ICD-10-CM

## 2018-06-25 NOTE — Progress Notes (Signed)
Cardiac Individual Treatment Plan  Patient Details  Name: Kenneth Pope MRN: [id number] Date of Birth: Feb 15, 1934 Referring Provider:     CARDIAC REHAB PHASE II ORIENTATION from 06/16/2018 in [place]  Referring Provider  Dr. Percival Spanish      Initial Encounter Date:    CARDIAC REHAB PHASE II ORIENTATION from 06/16/2018 in [place]  Date  06/16/18      Visit Diagnosis: 05/04/2018 NSTEMI (non-ST elevated myocardial infarction) ([place])  05/05/2018 Stented coronary artery S/P DES RCA  Patient's Home Medications on Admission:  Current Outpatient Medications:  .  amLODipine (NORVASC) 10 MG tablet, Take 1 tablet (10 mg total) by mouth daily., Disp: 90 tablet, Rfl: 3 .  aspirin EC 81 MG tablet, Take 81 mg by mouth daily., Disp: , Rfl:  .  atorvastatin (LIPITOR) 80 MG tablet, Take 1 tablet (80 mg total) by mouth daily at 6 PM., Disp: 90 tablet, Rfl: 3 .  brimonidine (ALPHAGAN) 0.2 % ophthalmic solution, Place 1 drop into both eyes 2 (two) times daily., Disp: , Rfl:  .  calcium carbonate (OSCAL) 1500 (600 Ca) MG TABS tablet, Take 600 mg of elemental calcium by mouth daily., Disp: , Rfl:  .  carvedilol (COREG) 3.125 MG tablet, Take 1 tablet (3.125 mg total) by mouth 2 (two) times daily with a meal., Disp: 90 tablet, Rfl: 3 .  COMBIGAN 0.2-0.5 % ophthalmic solution, Place 1 drop into both eyes 2 (two) times daily., Disp: , Rfl: 3 .  Multiple Vitamin (MULTIVITAMIN WITH MINERALS) TABS tablet, Take 1 tablet by mouth daily., Disp: , Rfl:  .  Multiple Vitamins-Minerals (PRESERVISION AREDS 2) CAPS, Take 1 capsule by mouth 2 (two) times daily. , Disp: , Rfl:  .  nitroGLYCERIN (NITROSTAT) 0.4 MG SL tablet, Place 1 tablet (0.4 mg total) under the tongue every 5 (five) minutes as needed for chest pain., Disp: 25 tablet, Rfl: 12 .  omega-3 acid ethyl esters (LOVAZA) 1 g capsule, Take 1 g by mouth 2 (two) times daily., Disp: , Rfl:  .   ticagrelor (BRILINTA) 90 MG TABS tablet, Take 1 tablet (90 mg total) by mouth 2 (two) times daily., Disp: 180 tablet, Rfl: 3 .  timolol (TIMOPTIC) 0.5 % ophthalmic solution, Place 1 drop into both eyes 2 (two) times daily., Disp: , Rfl:   Past Medical History: Past Medical History:  Diagnosis Date  . Arthritis   . CAD (coronary artery disease) 05/04/2018   a. 05/04/18 NSTEMI, 05/05/18 DES to dominant RCA  . Cancer ([place])    skin lesion  . Former tobacco use   . Hyperlipidemia   . Hypertension   . Macular degeneration     Tobacco Use: Social History   Tobacco Use  Smoking Status Former Smoker  . Years: 15.00  . Types: Cigarettes  . Last attempt to quit: 1976  . Years since quitting: 44.0  Smokeless Tobacco Never Used    Labs: Recent Chemical engineer    Labs for ITP Cardiac and Pulmonary Rehab Latest Ref Rng & Units 05/04/2018 05/05/2018   Cholestrol 0 - 200 mg/dL - 135   LDLCALC 0 - 99 mg/dL - 69   HDL >40 mg/dL - 56   Trlycerides <150 mg/dL - 51   Hemoglobin A1c 4.8 - 5.6 % 5.7(H) -      Capillary Blood Glucose: No results found for: GLUCAP   Exercise Target Goals: Exercise Program Goal: Individual exercise prescription set using results  from initial 6 min walk test and THRR while considering  patient's activity barriers and safety.   Exercise Prescription Goal: Initial exercise prescription builds to 30-45 minutes a day of aerobic activity, 2-3 days per week.  Home exercise guidelines will be given to patient during program as part of exercise prescription that the participant will acknowledge.  Activity Barriers & Risk Stratification: Activity Barriers & Cardiac Risk Stratification - 06/16/18 1109      Activity Barriers & Cardiac Risk Stratification   Activity Barriers  None    Cardiac Risk Stratification  High       6 Minute Walk: 6 Minute Walk    Row Name 06/16/18 1108         6 Minute Walk   Phase  Initial     Distance  1255 feet     Walk  Time  6 minutes     # of Rest Breaks  0     MPH  2.38     METS  2.33     RPE  9     VO2 Peak  8.14     Symptoms  No     Resting HR  72 bpm     Resting BP  120/60     Resting Oxygen Saturation   97 %     Exercise Oxygen Saturation  during 6 min walk  97 %     Max Ex. HR  106 bpm     Max Ex. BP  130/70     2 Minute Post BP  110/72        Oxygen Initial Assessment:   Oxygen Re-Evaluation:   Oxygen Discharge (Final Oxygen Re-Evaluation):   Initial Exercise Prescription: Initial Exercise Prescription - 06/16/18 1100      Date of Initial Exercise RX and Referring Provider   Date  06/16/18    Referring Provider  Dr. Percival Spanish    Expected Discharge Date  09/25/18      Treadmill   MPH  2.2    Grade  2    Minutes  10      Bike   Level  1.5    Watts  8    Minutes  10    METs  2.3      NuStep   Level  3    SPM  75    Minutes  10    METs  2.3      Prescription Details   Frequency (times per week)  3    Duration  Progress to 30 minutes of continuous aerobic without signs/symptoms of physical distress      Intensity   THRR 40-80% of Max Heartrate  55-110    Ratings of Perceived Exertion  11-13      Progression   Progression  Continue to progress workloads to maintain intensity without signs/symptoms of physical distress.      Resistance Training   Training Prescription  Yes    Weight  3 lbs.    Reps  10-15       Perform Capillary Blood Glucose checks as needed.  Exercise Prescription Changes: Exercise Prescription Changes    Row Name 06/22/18 0700             Response to Exercise   Blood Pressure (Admit)  100/70       Blood Pressure (Exercise)  134/70       Blood Pressure (Exit)  108/60       Heart Rate (Admit)  68 bpm       Heart Rate (Exercise)  94 bpm       Heart Rate (Exit)  72 bpm       Rating of Perceived Exertion (Exercise)  11       Perceived Dyspnea (Exercise)  0       Symptoms  None       Comments  Pt oriented to exercise equipment        Duration  Progress to 30 minutes of  aerobic without signs/symptoms of physical distress       Intensity  THRR unchanged         Progression   Progression  Continue to progress workloads to maintain intensity without signs/symptoms of physical distress.       Average METs  2.63         Resistance Training   Training Prescription  Yes       Weight  3lbs       Reps  10-15       Time  10 Minutes         Treadmill   MPH  2.2       Grade  2       Minutes  10       METs  3.29         Bike   Level  1.5       Minutes  10       METs  2         NuStep   Level  3       SPM  85       Minutes  10       METs  2.6          Exercise Comments: Exercise Comments    Row Name 06/22/18 0758           Exercise Comments  Pt's first day of exercise. Pt responded well to exericse workloads. Will continue to monitor and progress pt as tolerated.           Exercise Goals and Review: Exercise Goals    Row Name 06/16/18 1109             Exercise Goals   Increase Physical Activity  Yes       Intervention  Provide advice, education, support and counseling about physical activity/exercise needs.;Develop an individualized exercise prescription for aerobic and resistive training based on initial evaluation findings, risk stratification, comorbidities and participant's personal goals.       Expected Outcomes  Short Term: Attend rehab on a regular basis to increase amount of physical activity.       Increase Strength and Stamina  Yes       Intervention  Provide advice, education, support and counseling about physical activity/exercise needs.;Develop an individualized exercise prescription for aerobic and resistive training based on initial evaluation findings, risk stratification, comorbidities and participant's personal goals.       Expected Outcomes  Short Term: Increase workloads from initial exercise prescription for resistance, speed, and METs.       Able to understand and use rate of  perceived exertion (RPE) scale  Yes       Intervention  Provide education and explanation on how to use RPE scale       Expected Outcomes  Short Term: Able to use RPE daily in rehab to express subjective intensity level;Long Term:  Able to use RPE to guide intensity level when  exercising independently       Knowledge and understanding of Target Heart Rate Range (THRR)  Yes       Intervention  Provide education and explanation of THRR including how the numbers were predicted and where they are located for reference       Expected Outcomes  Short Term: Able to state/look up THRR;Long Term: Able to use THRR to govern intensity when exercising independently;Short Term: Able to use daily as guideline for intensity in rehab       Able to check pulse independently  Yes       Intervention  Provide education and demonstration on how to check pulse in carotid and radial arteries.;Review the importance of being able to check your own pulse for safety during independent exercise       Expected Outcomes  Short Term: Able to explain why pulse checking is important during independent exercise;Long Term: Able to check pulse independently and accurately       Understanding of Exercise Prescription  Yes       Intervention  Provide education, explanation, and written materials on patient's individual exercise prescription       Expected Outcomes  Short Term: Able to explain program exercise prescription;Long Term: Able to explain home exercise prescription to exercise independently          Exercise Goals Re-Evaluation :   Discharge Exercise Prescription (Final Exercise Prescription Changes): Exercise Prescription Changes - 06/22/18 0700      Response to Exercise   Blood Pressure (Admit)  100/70    Blood Pressure (Exercise)  134/70    Blood Pressure (Exit)  108/60    Heart Rate (Admit)  68 bpm    Heart Rate (Exercise)  94 bpm    Heart Rate (Exit)  72 bpm    Rating of Perceived Exertion (Exercise)  11     Perceived Dyspnea (Exercise)  0    Symptoms  None    Comments  Pt oriented to exercise equipment    Duration  Progress to 30 minutes of  aerobic without signs/symptoms of physical distress    Intensity  THRR unchanged      Progression   Progression  Continue to progress workloads to maintain intensity without signs/symptoms of physical distress.    Average METs  2.63      Resistance Training   Training Prescription  Yes    Weight  3lbs    Reps  10-15    Time  10 Minutes      Treadmill   MPH  2.2    Grade  2    Minutes  10    METs  3.29      Bike   Level  1.5    Minutes  10    METs  2      NuStep   Level  3    SPM  85    Minutes  10    METs  2.6       Nutrition:  Target Goals: Understanding of nutrition guidelines, daily intake of sodium 1500mg , cholesterol 200mg , calories 30% from fat and 7% or less from saturated fats, daily to have 5 or more servings of fruits and vegetables.  Biometrics: Pre Biometrics - 06/16/18 1110      Pre Biometrics   Height  5\' 8"  (1.727 m)    Weight  77.4 kg    Waist Circumference  40 inches    Hip Circumference  40 inches  Waist to Hip Ratio  1 %    BMI (Calculated)  25.95    Triceps Skinfold  30 mm    % Body Fat  30.3 %    Grip Strength  27 kg    Flexibility  11 in    Single Leg Stand  12.37 seconds        Nutrition Therapy Plan and Nutrition Goals: Nutrition Therapy & Goals - 06/16/18 1202      Nutrition Therapy   Diet  heart healthy, carb modified       Personal Nutrition Goals   Nutrition Goal  Pt to identify and limit food sources of saturated fat, trans fat, refined carbohydrates and sodium    Personal Goal #2  Pt to identify food quantities necessary to achieve weight loss of 2-6 lbs. at graduation from cardiac rehab    Personal Goal #3  Pt able to name foods that affect blood glucose.      Intervention Plan   Intervention  Prescribe, educate and counsel regarding individualized specific dietary modifications  aiming towards targeted core components such as weight, hypertension, lipid management, diabetes, heart failure and other comorbidities.    Expected Outcomes  Short Term Goal: Understand basic principles of dietary content, such as calories, fat, sodium, cholesterol and nutrients.;Long Term Goal: Adherence to prescribed nutrition plan.       Nutrition Assessments: Nutrition Assessments - 06/16/18 1204      MEDFICTS Scores   Pre Score  24       Nutrition Goals Re-Evaluation:   Nutrition Goals Re-Evaluation:   Nutrition Goals Discharge (Final Nutrition Goals Re-Evaluation):   Psychosocial: Target Goals: Acknowledge presence or absence of significant depression and/or stress, maximize coping skills, provide positive support system. Participant is able to verbalize types and ability to use techniques and skills needed for reducing stress and depression.  Initial Review & Psychosocial Screening: Initial Psych Review & Screening - 06/16/18 1022      Initial Review   Current issues with  None Identified      Family Dynamics   Good Support System?  Yes   wife     Barriers   Psychosocial barriers to participate in program  There are no identifiable barriers or psychosocial needs.      Screening Interventions   Interventions  Encouraged to exercise       Quality of Life Scores: Quality of Life - 06/16/18 1023      Quality of Life   Select  Quality of Life      Quality of Life Scores   Health/Function Pre  27.1 %    Socioeconomic Pre  29.1 %    Psych/Spiritual Pre  24.6 %    Family Pre  27.6 %    GLOBAL Pre  27.1 %      Scores of 19 and below usually indicate a poorer quality of life in these areas.  A difference of  2-3 points is a clinically meaningful difference.  A difference of 2-3 points in the total score of the Quality of Life Index has been associated with significant improvement in overall quality of life, self-image, physical symptoms, and general health in  studies assessing change in quality of life.  PHQ-9: Recent Review Flowsheet Data    Depression screen [facility] 2/9 06/22/2018   Decreased Interest 0   Down, Depressed, Hopeless 0   PHQ - 2 Score 0     Interpretation of Total Score  Total Score Depression Severity:  1-4 =  Minimal depression, 5-9 = Mild depression, 10-14 = Moderate depression, 15-19 = Moderately severe depression, 20-27 = Severe depression   Psychosocial Evaluation and Intervention: Psychosocial Evaluation - 06/22/18 0759      Psychosocial Evaluation & Interventions   Interventions  Encouraged to exercise with the program and follow exercise prescription    Comments  No psychosocial interventions necessary. Gene enjoys shooting pool and golfing.    Expected Outcomes  Gene will maintain a positive outlook utilizing good coping skills.    Continue Psychosocial Services   No Follow up required       Psychosocial Re-Evaluation: Psychosocial Re-Evaluation    [place] Name 06/25/18 1228             Psychosocial Re-Evaluation   Current issues with  None Identified       Comments  No psychosocial interventions necessary.       Expected Outcomes  Gene will maintain a positive outlook.        Interventions  Encouraged to attend Cardiac Rehabilitation for the exercise       Continue Psychosocial Services   No Follow up required          Psychosocial Discharge (Final Psychosocial Re-Evaluation): Psychosocial Re-Evaluation - 06/25/18 1228      Psychosocial Re-Evaluation   Current issues with  None Identified    Comments  No psychosocial interventions necessary.    Expected Outcomes  Gene will maintain a positive outlook.     Interventions  Encouraged to attend Cardiac Rehabilitation for the exercise    Continue Psychosocial Services   No Follow up required       Vocational Rehabilitation: Provide vocational rehab assistance to qualifying candidates.   Vocational Rehab Evaluation & Intervention: Vocational Rehab -  06/16/18 1023      Initial Vocational Rehab Evaluation & Intervention   Assessment shows need for Vocational Rehabilitation  No   retired       Education: Education Goals: Education classes will be provided on a weekly basis, covering required topics. Participant will state understanding/return demonstration of topics presented.  Learning Barriers/Preferences: Learning Barriers/Preferences - 06/16/18 1110      Learning Barriers/Preferences   Learning Barriers  Sight    Learning Preferences  Video;Skilled Demonstration;Individual Instruction       Education Topics: Count Your Pulse:  -Group instruction provided by verbal instruction, demonstration, patient participation and written materials to support subject.  Instructors address importance of being able to find your pulse and how to count your pulse when at home without a heart monitor.  Patients get hands on experience counting their pulse with staff help and individually.   Heart Attack, Angina, and Risk Factor Modification:  -Group instruction provided by verbal instruction, video, and written materials to support subject.  Instructors address signs and symptoms of angina and heart attacks.    Also discuss risk factors for heart disease and how to make changes to improve heart health risk factors.   Functional Fitness:  -Group instruction provided by verbal instruction, demonstration, patient participation, and written materials to support subject.  Instructors address safety measures for doing things around the house.  Discuss how to get up and down off the floor, how to pick things up properly, how to safely get out of a chair without assistance, and balance training.   Meditation and Mindfulness:  -Group instruction provided by verbal instruction, patient participation, and written materials to support subject.  Instructor addresses importance of mindfulness and meditation practice to help reduce stress  and improve  awareness.  Instructor also leads participants through a meditation exercise.    Stretching for Flexibility and Mobility:  -Group instruction provided by verbal instruction, patient participation, and written materials to support subject.  Instructors lead participants through series of stretches that are designed to increase flexibility thus improving mobility.  These stretches are additional exercise for major muscle groups that are typically performed during regular warm up and cool down.   Hands Only CPR:  -Group verbal, video, and participation provides a basic overview of AHA guidelines for community CPR. Role-play of emergencies allow participants the opportunity to practice calling for help and chest compression technique with discussion of AED use.   Hypertension: -Group verbal and written instruction that provides a basic overview of hypertension including the most recent diagnostic guidelines, risk factor reduction with self-care instructions and medication management.    Nutrition I class: Heart Healthy Eating:  -Group instruction provided by PowerPoint slides, verbal discussion, and written materials to support subject matter. The instructor gives an explanation and review of the Therapeutic Lifestyle Changes diet recommendations, which includes a discussion on lipid goals, dietary fat, sodium, fiber, plant stanol/sterol esters, sugar, and the components of a well-balanced, healthy diet.   Nutrition II class: Lifestyle Skills:  -Group instruction provided by PowerPoint slides, verbal discussion, and written materials to support subject matter. The instructor gives an explanation and review of label reading, grocery shopping for heart health, heart healthy recipe modifications, and ways to make healthier choices when eating out.   Diabetes Question & Answer:  -Group instruction provided by PowerPoint slides, verbal discussion, and written materials to support subject matter. The  instructor gives an explanation and review of diabetes co-morbidities, pre- and post-prandial blood glucose goals, pre-exercise blood glucose goals, signs, symptoms, and treatment of hypoglycemia and hyperglycemia, and foot care basics.   Diabetes Blitz:  -Group instruction provided by PowerPoint slides, verbal discussion, and written materials to support subject matter. The instructor gives an explanation and review of the physiology behind type 1 and type 2 diabetes, diabetes medications and rational behind using different medications, pre- and post-prandial blood glucose recommendations and Hemoglobin A1c goals, diabetes diet, and exercise including blood glucose guidelines for exercising safely.    Portion Distortion:  -Group instruction provided by PowerPoint slides, verbal discussion, written materials, and food models to support subject matter. The instructor gives an explanation of serving size versus portion size, changes in portions sizes over the last 20 years, and what consists of a serving from each food group.   Stress Management:  -Group instruction provided by verbal instruction, video, and written materials to support subject matter.  Instructors review role of stress in heart disease and how to cope with stress positively.     Exercising on Your Own:  -Group instruction provided by verbal instruction, power point, and written materials to support subject.  Instructors discuss benefits of exercise, components of exercise, frequency and intensity of exercise, and end points for exercise.  Also discuss use of nitroglycerin and activating EMS.  Review options of places to exercise outside of rehab.  Review guidelines for sex with heart disease.   Cardiac Drugs I:  -Group instruction provided by verbal instruction and written materials to support subject.  Instructor reviews cardiac drug classes: antiplatelets, anticoagulants, beta blockers, and statins.  Instructor discusses  reasons, side effects, and lifestyle considerations for each drug class.   CARDIAC REHAB PHASE II EXERCISE from 06/24/2018 in [place]  Date  06/24/18  Instruction Review Code  2- Demonstrated Understanding      Cardiac Drugs II:  -Group instruction provided by verbal instruction and written materials to support subject.  Instructor reviews cardiac drug classes: angiotensin converting enzyme inhibitors (ACE-I), angiotensin II receptor blockers (ARBs), nitrates, and calcium channel blockers.  Instructor discusses reasons, side effects, and lifestyle considerations for each drug class.   Anatomy and Physiology of the Circulatory System:  Group verbal and written instruction and models provide basic cardiac anatomy and physiology, with the coronary electrical and arterial systems. Review of: AMI, Angina, Valve disease, Heart Failure, Peripheral Artery Disease, Cardiac Arrhythmia, Pacemakers, and the ICD.   Other Education:  -Group or individual verbal, written, or video instructions that support the educational goals of the cardiac rehab program.   Holiday Eating Survival Tips:  -Group instruction provided by PowerPoint slides, verbal discussion, and written materials to support subject matter. The instructor gives patients tips, tricks, and techniques to help them not only survive but enjoy the holidays despite the onslaught of food that accompanies the holidays.   Knowledge Questionnaire Score: Knowledge Questionnaire Score - 06/16/18 1023      Knowledge Questionnaire Score   Pre Score  21/24       Core Components/Risk Factors/Patient Goals at Admission: Personal Goals and Risk Factors at Admission - 06/16/18 1111      Core Components/Risk Factors/Patient Goals on Admission    Weight Management  Yes;Weight Maintenance    Intervention  Weight Management: Develop a combined nutrition and exercise program designed to reach desired caloric intake, while  maintaining appropriate intake of nutrient and fiber, sodium and fats, and appropriate energy expenditure required for the weight goal.;Weight Management: Provide education and appropriate resources to help participant work on and attain dietary goals.    Admit Weight  170 lb 10.2 oz (77.4 kg)    Expected Outcomes  Short Term: Continue to assess and modify interventions until short term weight is achieved;Long Term: Adherence to nutrition and physical activity/exercise program aimed toward attainment of established weight goal;Weight Maintenance: Understanding of the daily nutrition guidelines, which includes 25-35% calories from fat, 7% or less cal from saturated fats, less than 200mg  cholesterol, less than 1.5gm of sodium, & 5 or more servings of fruits and vegetables daily;Understanding recommendations for meals to include 15-35% energy as protein, 25-35% energy from fat, 35-60% energy from carbohydrates, less than 200mg  of dietary cholesterol, 20-35 gm of total fiber daily;Understanding of distribution of calorie intake throughout the day with the consumption of 4-5 meals/snacks    Hypertension  Yes    Intervention  Provide education on lifestyle modifcations including regular physical activity/exercise, weight management, moderate sodium restriction and increased consumption of fresh fruit, vegetables, and low fat dairy, alcohol moderation, and smoking cessation.;Monitor prescription use compliance.    Expected Outcomes  Long Term: Maintenance of blood pressure at goal levels.;Short Term: Continued assessment and intervention until BP is < 140/21mm HG in hypertensive participants. < 130/37mm HG in hypertensive participants with diabetes, heart failure or chronic kidney disease.    Lipids  Yes    Intervention  Provide education and support for participant on nutrition & aerobic/resistive exercise along with prescribed medications to achieve LDL 70mg , HDL >40mg .    Expected Outcomes  Short Term:  Participant states understanding of desired cholesterol values and is compliant with medications prescribed. Participant is following exercise prescription and nutrition guidelines.;Long Term: Cholesterol controlled with medications as prescribed, with individualized exercise RX and with personalized nutrition plan. Value  goals: LDL < 70mg , HDL > 40 mg.    Stress  Yes    Intervention  Offer individual and/or small group education and counseling on adjustment to heart disease, stress management and health-related lifestyle change. Teach and support self-help strategies.;Refer participants experiencing significant psychosocial distress to appropriate mental health specialists for further evaluation and treatment. When possible, include family members and significant others in education/counseling sessions.    Expected Outcomes  Short Term: Participant demonstrates changes in health-related behavior, relaxation and other stress management skills, ability to obtain effective social support, and compliance with psychotropic medications if prescribed.;Long Term: Emotional wellbeing is indicated by absence of clinically significant psychosocial distress or social isolation.       Core Components/Risk Factors/Patient Goals Review:  Goals and Risk Factor Review    Row Name 06/22/18 0802             Core Components/Risk Factors/Patient Goals Review   Personal Goals Review  Weight Management/Obesity;Hypertension;Lipids;Stress       Review  Pt with multiple CAD RFs willing to participate in CR exercise.  Gene would like to be able to feel comfortable exercising at home.        Expected Outcomes  Pt will continue to participate in CR exercise, nutrition, and lifestyle modification opportunities.           Core Components/Risk Factors/Patient Goals at Discharge (Final Review):  Goals and Risk Factor Review - 06/22/18 0802      Core Components/Risk Factors/Patient Goals Review   Personal Goals Review   Weight Management/Obesity;Hypertension;Lipids;Stress    Review  Pt with multiple CAD RFs willing to participate in CR exercise.  Gene would like to be able to feel comfortable exercising at home.     Expected Outcomes  Pt will continue to participate in CR exercise, nutrition, and lifestyle modification opportunities.        ITP Comments: ITP Comments    Row Name 06/16/18 [phone] 06/22/18 0757         ITP Comments  Dr. Fransico Him, Medical Director   30 Day ITP Review. Pt started exercise today and tolerated it well.          Comments: See ITP Comments.

## 2018-06-26 ENCOUNTER — Encounter (HOSPITAL_COMMUNITY)
Admission: RE | Admit: 2018-06-26 | Discharge: 2018-06-26 | Disposition: A | Discharge status: Home / Self Care | Payer: Medicare Other | Source: Ambulatory Visit | Attending: Cardiology | Admitting: Cardiology

## 2018-06-26 DIAGNOSIS — Z955 Presence of coronary angioplasty implant and graft: Secondary | ICD-10-CM | POA: Diagnosis not present

## 2018-06-26 DIAGNOSIS — I214 Non-ST elevation (NSTEMI) myocardial infarction: Secondary | ICD-10-CM

## 2018-06-29 ENCOUNTER — Telehealth: Payer: Self-pay | Admitting: Cardiology

## 2018-06-29 ENCOUNTER — Encounter (HOSPITAL_COMMUNITY)
Admission: RE | Admit: 2018-06-29 | Discharge: 2018-06-29 | Disposition: A | Discharge status: Home / Self Care | Payer: Medicare Other | Source: Ambulatory Visit | Attending: Cardiology | Admitting: Cardiology

## 2018-06-29 DIAGNOSIS — I214 Non-ST elevation (NSTEMI) myocardial infarction: Secondary | ICD-10-CM | POA: Diagnosis not present

## 2018-06-29 DIAGNOSIS — Z955 Presence of coronary angioplasty implant and graft: Secondary | ICD-10-CM

## 2018-06-29 MED ORDER — TICAGRELOR 90 MG PO TABS: 90.0000 mg | ORAL_TABLET | Freq: Two times a day (BID) | ORAL | 3 refills | Status: DC

## 2018-06-29 NOTE — Telephone Encounter (Signed)
New Message    *STAT* If patient is at the pharmacy, call can be transferred to refill team.   1. Which medications need to be refilled? (please list name of each medication and dose if known) Brilinta 90mg    2. Which pharmacy/location (including street and city if local pharmacy) is medication to be sent to? [facility] on [address] and [address]   3. Do they need a 30 day or 90 day supply? 90 days

## 2018-07-01 ENCOUNTER — Encounter (HOSPITAL_COMMUNITY)
Admission: RE | Admit: 2018-07-01 | Discharge: 2018-07-01 | Disposition: A | Discharge status: Home / Self Care | Payer: Medicare Other | Source: Ambulatory Visit | Attending: Cardiology | Admitting: Cardiology

## 2018-07-01 DIAGNOSIS — Z955 Presence of coronary angioplasty implant and graft: Secondary | ICD-10-CM | POA: Diagnosis not present

## 2018-07-01 DIAGNOSIS — I214 Non-ST elevation (NSTEMI) myocardial infarction: Secondary | ICD-10-CM

## 2018-07-03 ENCOUNTER — Encounter (HOSPITAL_COMMUNITY)
Admission: RE | Admit: 2018-07-03 | Discharge: 2018-07-03 | Disposition: A | Discharge status: Home / Self Care | Payer: Medicare Other | Source: Ambulatory Visit | Attending: Cardiology | Admitting: Cardiology

## 2018-07-03 DIAGNOSIS — I214 Non-ST elevation (NSTEMI) myocardial infarction: Secondary | ICD-10-CM

## 2018-07-03 DIAGNOSIS — Z955 Presence of coronary angioplasty implant and graft: Secondary | ICD-10-CM

## 2018-07-06 ENCOUNTER — Encounter (HOSPITAL_COMMUNITY)
Admission: RE | Admit: 2018-07-06 | Discharge: 2018-07-06 | Disposition: A | Discharge status: Home / Self Care | Payer: Medicare Other | Source: Ambulatory Visit | Attending: Cardiology | Admitting: Cardiology

## 2018-07-06 DIAGNOSIS — Z955 Presence of coronary angioplasty implant and graft: Secondary | ICD-10-CM

## 2018-07-06 DIAGNOSIS — I214 Non-ST elevation (NSTEMI) myocardial infarction: Secondary | ICD-10-CM

## 2018-07-07 ENCOUNTER — Encounter (INDEPENDENT_AMBULATORY_CARE_PROVIDER_SITE_OTHER): Payer: Medicare Other | Admitting: Ophthalmology

## 2018-07-07 DIAGNOSIS — H35033 Hypertensive retinopathy, bilateral: Secondary | ICD-10-CM | POA: Diagnosis not present

## 2018-07-07 DIAGNOSIS — I10 Essential (primary) hypertension: Secondary | ICD-10-CM | POA: Diagnosis not present

## 2018-07-07 DIAGNOSIS — H353221 Exudative age-related macular degeneration, left eye, with active choroidal neovascularization: Secondary | ICD-10-CM | POA: Diagnosis not present

## 2018-07-07 DIAGNOSIS — H353112 Nonexudative age-related macular degeneration, right eye, intermediate dry stage: Secondary | ICD-10-CM | POA: Diagnosis not present

## 2018-07-07 DIAGNOSIS — H43813 Vitreous degeneration, bilateral: Secondary | ICD-10-CM

## 2018-07-07 DIAGNOSIS — H33301 Unspecified retinal break, right eye: Secondary | ICD-10-CM

## 2018-07-08 ENCOUNTER — Encounter (HOSPITAL_COMMUNITY)
Admission: RE | Admit: 2018-07-08 | Discharge: 2018-07-08 | Disposition: A | Discharge status: Home / Self Care | Payer: Medicare Other | Source: Ambulatory Visit | Attending: Cardiology | Admitting: Cardiology

## 2018-07-08 DIAGNOSIS — I214 Non-ST elevation (NSTEMI) myocardial infarction: Secondary | ICD-10-CM | POA: Diagnosis not present

## 2018-07-08 DIAGNOSIS — Z955 Presence of coronary angioplasty implant and graft: Secondary | ICD-10-CM | POA: Diagnosis not present

## 2018-07-10 ENCOUNTER — Encounter (HOSPITAL_COMMUNITY)
Admission: RE | Admit: 2018-07-10 | Discharge: 2018-07-10 | Disposition: A | Discharge status: Home / Self Care | Payer: Medicare Other | Source: Ambulatory Visit | Attending: Cardiology | Admitting: Cardiology

## 2018-07-10 DIAGNOSIS — I214 Non-ST elevation (NSTEMI) myocardial infarction: Secondary | ICD-10-CM

## 2018-07-10 DIAGNOSIS — Z955 Presence of coronary angioplasty implant and graft: Secondary | ICD-10-CM | POA: Diagnosis not present

## 2018-07-10 NOTE — Progress Notes (Signed)
Kenneth Pope 85 y.o. male DOB: 1934/02/09 MRN: [id number]      Nutrition Note  1. 05/04/2018 NSTEMI (non-ST elevated myocardial infarction) ([place])   2. 05/05/2018 Stented coronary artery S/P DES RCA    Past Medical History:  Diagnosis Date  . Arthritis   . CAD (coronary artery disease) 05/04/2018   a. 05/04/18 NSTEMI, 05/05/18 DES to dominant RCA  . Cancer ([place])    skin lesion  . Former tobacco use   . Hyperlipidemia   . Hypertension   . Macular degeneration    Meds reviewed.    Current Outpatient Medications (Cardiovascular):  .  amLODipine (NORVASC) 10 MG tablet, Take 1 tablet (10 mg total) by mouth daily. Marland Kitchen  atorvastatin (LIPITOR) 80 MG tablet, Take 1 tablet (80 mg total) by mouth daily at 6 PM. .  carvedilol (COREG) 3.125 MG tablet, Take 1 tablet (3.125 mg total) by mouth 2 (two) times daily with a meal. .  nitroGLYCERIN (NITROSTAT) 0.4 MG SL tablet, Place 1 tablet (0.4 mg total) under the tongue every 5 (five) minutes as needed for chest pain. Marland Kitchen  omega-3 acid ethyl esters (LOVAZA) 1 g capsule, Take 1 g by mouth 2 (two) times daily.   Current Outpatient Medications (Analgesics):  .  aspirin EC 81 MG tablet, Take 81 mg by mouth daily.  Current Outpatient Medications (Hematological):  .  ticagrelor (BRILINTA) 90 MG TABS tablet, Take 1 tablet (90 mg total) by mouth 2 (two) times daily.  Current Outpatient Medications (Other):  .  brimonidine (ALPHAGAN) 0.2 % ophthalmic solution, Place 1 drop into both eyes 2 (two) times daily. .  calcium carbonate (OSCAL) 1500 (600 Ca) MG TABS tablet, Take 600 mg of elemental calcium by mouth daily. .  COMBIGAN 0.2-0.5 % ophthalmic solution, Place 1 drop into both eyes 2 (two) times daily. .  Multiple Vitamin (MULTIVITAMIN WITH MINERALS) TABS tablet, Take 1 tablet by mouth daily. .  Multiple Vitamins-Minerals (PRESERVISION AREDS 2) CAPS, Take 1 capsule by mouth 2 (two) times daily.  .  timolol (TIMOPTIC) 0.5 % ophthalmic solution,  Place 1 drop into both eyes 2 (two) times daily.   HT: Ht Readings from Last 1 Encounters:  06/16/18 5\' 8"  (1.727 m)    WT: Wt Readings from Last 5 Encounters:  06/16/18 170 lb 10.2 oz (77.4 kg)  05/16/18 172 lb 2.9 oz (78.1 kg)  05/13/18 172 lb 3.2 oz (78.1 kg)  05/06/18 169 lb 12.1 oz (77 kg)       Current tobacco use? No  Labs:  Lipid Panel     Component Value Date/Time   CHOL 135 05/05/2018 0219   TRIG 51 05/05/2018 0219   HDL 56 05/05/2018 0219   CHOLHDL 2.4 05/05/2018 0219   VLDL 10 05/05/2018 0219   LDLCALC 69 05/05/2018 0219    Lab Results  Component Value Date   HGBA1C 5.7 (H) 05/04/2018   CBG (last 3)  No results for input(s): GLUCAP in the last 72 hours.  Nutrition Note Spoke with pt. Nutrition plan and goals reviewed with pt. Pt is following heart healthy diet. Heart healthy tips reviewed (label reading, how to build a healthy plate, portion sizes, eating frequently across the day). Pt last A1C was elevated, 5.7 (05/04/18). Discussed the differences between complex and refined carbs, recommended pt replace refined carbs with complex. Reviewed the benefits swapping in complex carbs and moderating portion sizes can have on managing blood glucose with patient. Reminded pt to continue to work on goals  discussed at orientation 06/16/18 (label read and begin to reduce the amount of simple carbs, saturated fat, sodium and trans fats). Pt expressed understanding of the information reviewed. Pt aware of nutrition education classes offered and would no like to attend nutrition classes.  Nutrition Diagnosis ? Food-and nutrition-related knowledge deficit related to lack of exposure to information as related to diagnosis of: ? CVD   Nutrition Intervention ? Pt's individual nutrition plan and goals reviewed with pt.  Nutrition Goal(s):  ? Pt to identify and limit food sources of saturated fat, trans fat, refined carbohydrates and sodium ? Pt to describe the benefit of  including fruits, vegetables, whole grains, and low-fat dairy products in a heart healthy meal plan. ? Pt able to name foods that affect blood glucose.   Plan:  ? Pt to attend nutrition classes ? Nutrition I ? Nutrition II ? Portion Distortion  ? Diabetes Blitz ? Diabetes Q & Ae determined ? Will provide client-centered nutrition education as part of interdisciplinary care ? Monitor and evaluate progress toward nutrition goal with team.   Laurina Bustle, MS, RD, LDN 07/10/2018 8:03 AM

## 2018-07-13 ENCOUNTER — Encounter (HOSPITAL_COMMUNITY)
Admission: RE | Admit: 2018-07-13 | Discharge: 2018-07-13 | Disposition: A | Discharge status: Home / Self Care | Payer: Medicare Other | Source: Ambulatory Visit | Attending: Cardiology | Admitting: Cardiology

## 2018-07-13 DIAGNOSIS — I214 Non-ST elevation (NSTEMI) myocardial infarction: Secondary | ICD-10-CM

## 2018-07-13 DIAGNOSIS — Z955 Presence of coronary angioplasty implant and graft: Secondary | ICD-10-CM | POA: Diagnosis not present

## 2018-07-13 NOTE — Progress Notes (Signed)
I have reviewed a Home Exercise Prescription with Kenneth Pope . Kenneth Pope is not currently exercising at home. The patient was advised to walk 2-3 days a week for 30-45 minutes.  Kenneth Pope and I discussed how to progress their exercise prescription. The patient stated that their goals were to transition to gym near his home.The patient stated that they understand the exercise prescription.  We reviewed exercise guidelines, target heart rate during exercise, RPE Scale, weather conditions, NTG use, endpoints for exercise, warmup and cool down. Patient is encouraged to come to me with any questions. I will continue to follow up with the patient to assist them with progression and safety.     Carma Lair MS, ACSM CEP 1:42 PM 07/13/2018

## 2018-07-15 ENCOUNTER — Encounter (HOSPITAL_COMMUNITY)
Admission: RE | Admit: 2018-07-15 | Discharge: 2018-07-15 | Disposition: A | Discharge status: Home / Self Care | Payer: Medicare Other | Source: Ambulatory Visit | Attending: Cardiology | Admitting: Cardiology

## 2018-07-15 DIAGNOSIS — I214 Non-ST elevation (NSTEMI) myocardial infarction: Secondary | ICD-10-CM | POA: Diagnosis not present

## 2018-07-15 DIAGNOSIS — Z955 Presence of coronary angioplasty implant and graft: Secondary | ICD-10-CM

## 2018-07-17 ENCOUNTER — Encounter (HOSPITAL_COMMUNITY)
Admission: RE | Admit: 2018-07-17 | Discharge: 2018-07-17 | Disposition: A | Discharge status: Home / Self Care | Payer: Medicare Other | Source: Ambulatory Visit | Attending: Cardiology | Admitting: Cardiology

## 2018-07-17 DIAGNOSIS — I214 Non-ST elevation (NSTEMI) myocardial infarction: Secondary | ICD-10-CM

## 2018-07-17 DIAGNOSIS — Z955 Presence of coronary angioplasty implant and graft: Secondary | ICD-10-CM | POA: Diagnosis not present

## 2018-07-20 ENCOUNTER — Encounter (HOSPITAL_COMMUNITY)
Admission: RE | Admit: 2018-07-20 | Discharge: 2018-07-20 | Disposition: A | Discharge status: Home / Self Care | Payer: Medicare Other | Source: Ambulatory Visit | Attending: Cardiology | Admitting: Cardiology

## 2018-07-20 DIAGNOSIS — I214 Non-ST elevation (NSTEMI) myocardial infarction: Secondary | ICD-10-CM | POA: Diagnosis not present

## 2018-07-20 DIAGNOSIS — Z955 Presence of coronary angioplasty implant and graft: Secondary | ICD-10-CM | POA: Insufficient documentation

## 2018-07-22 ENCOUNTER — Encounter (HOSPITAL_COMMUNITY)
Admission: RE | Admit: 2018-07-22 | Discharge: 2018-07-22 | Disposition: A | Discharge status: Home / Self Care | Payer: Medicare Other | Source: Ambulatory Visit | Attending: Cardiology | Admitting: Cardiology

## 2018-07-22 DIAGNOSIS — Z955 Presence of coronary angioplasty implant and graft: Secondary | ICD-10-CM

## 2018-07-22 DIAGNOSIS — I214 Non-ST elevation (NSTEMI) myocardial infarction: Secondary | ICD-10-CM | POA: Diagnosis not present

## 2018-07-23 DIAGNOSIS — H31011 Macula scars of posterior pole (postinflammatory) (post-traumatic), right eye: Secondary | ICD-10-CM | POA: Diagnosis not present

## 2018-07-23 DIAGNOSIS — H401122 Primary open-angle glaucoma, left eye, moderate stage: Secondary | ICD-10-CM | POA: Diagnosis not present

## 2018-07-23 DIAGNOSIS — H353110 Nonexudative age-related macular degeneration, right eye, stage unspecified: Secondary | ICD-10-CM | POA: Diagnosis not present

## 2018-07-23 DIAGNOSIS — H401111 Primary open-angle glaucoma, right eye, mild stage: Secondary | ICD-10-CM | POA: Diagnosis not present

## 2018-07-23 NOTE — Progress Notes (Signed)
Cardiac Individual Treatment Plan  Patient Details  Name: Kenneth Pope MRN: [id number] Date of Birth: 1933/08/17 Referring Provider:     CARDIAC REHAB PHASE II ORIENTATION from 06/16/2018 in [place]  Referring Provider  Dr. Percival Spanish      Initial Encounter Date:    CARDIAC REHAB PHASE II ORIENTATION from 06/16/2018 in [place]  Date  06/16/18      Visit Diagnosis: 05/04/2018 NSTEMI (non-ST elevated myocardial infarction) ([place])  05/05/2018 Stented coronary artery S/P DES RCA  Patient's Home Medications on Admission:  Current Outpatient Medications:  .  amLODipine (NORVASC) 10 MG tablet, Take 1 tablet (10 mg total) by mouth daily., Disp: 90 tablet, Rfl: 3 .  aspirin EC 81 MG tablet, Take 81 mg by mouth daily., Disp: , Rfl:  .  atorvastatin (LIPITOR) 80 MG tablet, Take 1 tablet (80 mg total) by mouth daily at 6 PM., Disp: 90 tablet, Rfl: 3 .  brimonidine (ALPHAGAN) 0.2 % ophthalmic solution, Place 1 drop into both eyes 2 (two) times daily., Disp: , Rfl:  .  calcium carbonate (OSCAL) 1500 (600 Ca) MG TABS tablet, Take 600 mg of elemental calcium by mouth daily., Disp: , Rfl:  .  carvedilol (COREG) 3.125 MG tablet, Take 1 tablet (3.125 mg total) by mouth 2 (two) times daily with a meal., Disp: 90 tablet, Rfl: 3 .  COMBIGAN 0.2-0.5 % ophthalmic solution, Place 1 drop into both eyes 2 (two) times daily., Disp: , Rfl: 3 .  Multiple Vitamin (MULTIVITAMIN WITH MINERALS) TABS tablet, Take 1 tablet by mouth daily., Disp: , Rfl:  .  Multiple Vitamins-Minerals (PRESERVISION AREDS 2) CAPS, Take 1 capsule by mouth 2 (two) times daily. , Disp: , Rfl:  .  nitroGLYCERIN (NITROSTAT) 0.4 MG SL tablet, Place 1 tablet (0.4 mg total) under the tongue every 5 (five) minutes as needed for chest pain., Disp: 25 tablet, Rfl: 12 .  omega-3 acid ethyl esters (LOVAZA) 1 g capsule, Take 1 g by mouth 2 (two) times daily., Disp: , Rfl:  .   ticagrelor (BRILINTA) 90 MG TABS tablet, Take 1 tablet (90 mg total) by mouth 2 (two) times daily., Disp: 180 tablet, Rfl: 3 .  timolol (TIMOPTIC) 0.5 % ophthalmic solution, Place 1 drop into both eyes 2 (two) times daily., Disp: , Rfl:   Past Medical History: Past Medical History:  Diagnosis Date  . Arthritis   . CAD (coronary artery disease) 05/04/2018   a. 05/04/18 NSTEMI, 05/05/18 DES to dominant RCA  . Cancer ([place])    skin lesion  . Former tobacco use   . Hyperlipidemia   . Hypertension   . Macular degeneration     Tobacco Use: Social History   Tobacco Use  Smoking Status Former Smoker  . Years: 15.00  . Types: Cigarettes  . Last attempt to quit: 1976  . Years since quitting: 44.1  Smokeless Tobacco Never Used    Labs: Recent Chemical engineer    Labs for ITP Cardiac and Pulmonary Rehab Latest Ref Rng & Units 05/04/2018 05/05/2018   Cholestrol 0 - 200 mg/dL - 135   LDLCALC 0 - 99 mg/dL - 69   HDL >40 mg/dL - 56   Trlycerides <150 mg/dL - 51   Hemoglobin A1c 4.8 - 5.6 % 5.7(H) -      Capillary Blood Glucose: No results found for: GLUCAP   Exercise Target Goals: Exercise Program Goal: Individual exercise prescription set using results  from initial 6 min walk test and THRR while considering  patient's activity barriers and safety.   Exercise Prescription Goal: Initial exercise prescription builds to 30-45 minutes a day of aerobic activity, 2-3 days per week.  Home exercise guidelines will be given to patient during program as part of exercise prescription that the participant will acknowledge.  Activity Barriers & Risk Stratification: Activity Barriers & Cardiac Risk Stratification - 06/16/18 1109      Activity Barriers & Cardiac Risk Stratification   Activity Barriers  None    Cardiac Risk Stratification  High       6 Minute Walk: 6 Minute Walk    Row Name 06/16/18 1108         6 Minute Walk   Phase  Initial     Distance  1255 feet     Walk  Time  6 minutes     # of Rest Breaks  0     MPH  2.38     METS  2.33     RPE  9     VO2 Peak  8.14     Symptoms  No     Resting HR  72 bpm     Resting BP  120/60     Resting Oxygen Saturation   97 %     Exercise Oxygen Saturation  during 6 min walk  97 %     Max Ex. HR  106 bpm     Max Ex. BP  130/70     2 Minute Post BP  110/72        Oxygen Initial Assessment:   Oxygen Re-Evaluation:   Oxygen Discharge (Final Oxygen Re-Evaluation):   Initial Exercise Prescription: Initial Exercise Prescription - 06/16/18 1100      Date of Initial Exercise RX and Referring Provider   Date  06/16/18    Referring Provider  Dr. Percival Spanish    Expected Discharge Date  09/25/18      Treadmill   MPH  2.2    Grade  2    Minutes  10      Bike   Level  1.5    Watts  8    Minutes  10    METs  2.3      NuStep   Level  3    SPM  75    Minutes  10    METs  2.3      Prescription Details   Frequency (times per week)  3    Duration  Progress to 30 minutes of continuous aerobic without signs/symptoms of physical distress      Intensity   THRR 40-80% of Max Heartrate  55-110    Ratings of Perceived Exertion  11-13      Progression   Progression  Continue to progress workloads to maintain intensity without signs/symptoms of physical distress.      Resistance Training   Training Prescription  Yes    Weight  3 lbs.    Reps  10-15       Perform Capillary Blood Glucose checks as needed.  Exercise Prescription Changes: Exercise Prescription Changes    Row Name 06/22/18 0700 07/03/18 1635 07/13/18 1600 07/17/18 1000       Response to Exercise   Blood Pressure (Admit)  100/70  112/64  124/64  108/64    Blood Pressure (Exercise)  134/70  128/58  138/70  122/70    Blood Pressure (Exit)  108/60  100/60  102/60  108/70    Heart Rate (Admit)  68 bpm  63 bpm  62 bpm  66 bpm    Heart Rate (Exercise)  94 bpm  87 bpm  92 bpm  96 bpm    Heart Rate (Exit)  72 bpm  64 bpm  70 bpm  65 bpm     Rating of Perceived Exertion (Exercise)  11  11  11  12     Perceived Dyspnea (Exercise)  0  0  0  0    Symptoms  None  None  None  None    Comments  Pt oriented to exercise equipment  None  None  None    Duration  Progress to 30 minutes of  aerobic without signs/symptoms of physical distress  Progress to 30 minutes of  aerobic without signs/symptoms of physical distress  Progress to 30 minutes of  aerobic without signs/symptoms of physical distress  Continue with 30 min of aerobic exercise without signs/symptoms of physical distress.    Intensity  THRR unchanged  THRR unchanged  THRR unchanged  THRR unchanged      Progression   Progression  Continue to progress workloads to maintain intensity without signs/symptoms of physical distress.  Continue to progress workloads to maintain intensity without signs/symptoms of physical distress.  Continue to progress workloads to maintain intensity without signs/symptoms of physical distress.  Continue to progress workloads to maintain intensity without signs/symptoms of physical distress.    Average METs  2.63  3.43  3.5  3.8      Resistance Training   Training Prescription  Yes  Yes  Yes  Yes    Weight  3lbs  5lbs  5lbs  5lbs    Reps  10-15  10-15  10-15  10-15    Time  10 Minutes  10 Minutes  10 Minutes  10 Minutes      Treadmill   MPH  2.2  2.4  2.6  -    Grade  2  2  2   -    Minutes  10  10  10   -    METs  3.29  3.5  3.71  -      Bike   Level  1.5  1.5  -  -    Minutes  10  10  -  -    METs  2  2  -  -      Recumbant Bike   Level  -  1.5  2.7  2.7    Watts  -  30  35  40    Minutes  -  10  10  10     METs  -  2.9  2.9  3      NuStep   Level  3  3  3  4     SPM  85  95  95  100    Minutes  10  10  10  10     METs  2.6  3.9  3.9  4.5      Home Exercise Plan   Plans to continue exercise at  -  -  Home (comment) Walking  Home (comment) Walking    Frequency  -  -  Add 2 additional days to program exercise sessions.  Add 2 additional days  to program exercise sessions.    Initial Home Exercises Provided  -  -  07/13/18  07/13/18       Exercise Comments: Exercise  Comments    Row Name 06/22/18 0758 07/13/18 1343         Exercise Comments  Pt's first day of exercise. Pt responded well to exericse workloads. Will continue to monitor and progress pt as tolerated.   Reveiwed HEP with pt. Pt is not currently exercising at home. Spoke with pt regarding adding exercise to off days. Pt verbalized understanding. Will continue to monitor and progress pt as tolerated.          Exercise Goals and Review: Exercise Goals    Row Name 06/16/18 1109             Exercise Goals   Increase Physical Activity  Yes       Intervention  Provide advice, education, support and counseling about physical activity/exercise needs.;Develop an individualized exercise prescription for aerobic and resistive training based on initial evaluation findings, risk stratification, comorbidities and participant's personal goals.       Expected Outcomes  Short Term: Attend rehab on a regular basis to increase amount of physical activity.       Increase Strength and Stamina  Yes       Intervention  Provide advice, education, support and counseling about physical activity/exercise needs.;Develop an individualized exercise prescription for aerobic and resistive training based on initial evaluation findings, risk stratification, comorbidities and participant's personal goals.       Expected Outcomes  Short Term: Increase workloads from initial exercise prescription for resistance, speed, and METs.       Able to understand and use rate of perceived exertion (RPE) scale  Yes       Intervention  Provide education and explanation on how to use RPE scale       Expected Outcomes  Short Term: Able to use RPE daily in rehab to express subjective intensity level;Long Term:  Able to use RPE to guide intensity level when exercising independently       Knowledge and understanding  of Target Heart Rate Range (THRR)  Yes       Intervention  Provide education and explanation of THRR including how the numbers were predicted and where they are located for reference       Expected Outcomes  Short Term: Able to state/look up THRR;Long Term: Able to use THRR to govern intensity when exercising independently;Short Term: Able to use daily as guideline for intensity in rehab       Able to check pulse independently  Yes       Intervention  Provide education and demonstration on how to check pulse in carotid and radial arteries.;Review the importance of being able to check your own pulse for safety during independent exercise       Expected Outcomes  Short Term: Able to explain why pulse checking is important during independent exercise;Long Term: Able to check pulse independently and accurately       Understanding of Exercise Prescription  Yes       Intervention  Provide education, explanation, and written materials on patient's individual exercise prescription       Expected Outcomes  Short Term: Able to explain program exercise prescription;Long Term: Able to explain home exercise prescription to exercise independently          Exercise Goals Re-Evaluation : Exercise Goals Re-Evaluation    Row Name 07/13/18 1344             Exercise Goal Re-Evaluation   Exercise Goals Review  Increase Physical Activity;Able to understand and use rate of  perceived exertion (RPE) scale;Knowledge and understanding of Target Heart Rate Range (THRR);Understanding of Exercise Prescription;Increase Strength and Stamina;Able to check pulse independently       Comments  Reveiwed HEP with pt. Also reviewed THRR, RPE Scale, weather conditions, endpoints of exercise, NTG use, warmup and cool down.        Expected Outcomes  Pt will plan to walk for exercise 2-3 days a week for 30-45 minutes. Pt will continue to increase cardiovascular strength. Will continue to monitor and progress pt as tolerated.            Discharge Exercise Prescription (Final Exercise Prescription Changes): Exercise Prescription Changes - 07/17/18 1000      Response to Exercise   Blood Pressure (Admit)  108/64    Blood Pressure (Exercise)  122/70    Blood Pressure (Exit)  108/70    Heart Rate (Admit)  66 bpm    Heart Rate (Exercise)  96 bpm    Heart Rate (Exit)  65 bpm    Rating of Perceived Exertion (Exercise)  12    Perceived Dyspnea (Exercise)  0    Symptoms  None    Comments  None    Duration  Continue with 30 min of aerobic exercise without signs/symptoms of physical distress.    Intensity  THRR unchanged      Progression   Progression  Continue to progress workloads to maintain intensity without signs/symptoms of physical distress.    Average METs  3.8      Resistance Training   Training Prescription  Yes    Weight  5lbs    Reps  10-15    Time  10 Minutes      Recumbant Bike   Level  2.7    Watts  40    Minutes  10    METs  3      NuStep   Level  4    SPM  100    Minutes  10    METs  4.5      Home Exercise Plan   Plans to continue exercise at  Home (comment)   Walking   Frequency  Add 2 additional days to program exercise sessions.    Initial Home Exercises Provided  07/13/18       Nutrition:  Target Goals: Understanding of nutrition guidelines, daily intake of sodium 1500mg , cholesterol 200mg , calories 30% from fat and 7% or less from saturated fats, daily to have 5 or more servings of fruits and vegetables.  Biometrics: Pre Biometrics - 06/16/18 1110      Pre Biometrics   Height  5\' 8"  (1.727 m)    Weight  77.4 kg    Waist Circumference  40 inches    Hip Circumference  40 inches    Waist to Hip Ratio  1 %    BMI (Calculated)  25.95    Triceps Skinfold  30 mm    % Body Fat  30.3 %    Grip Strength  27 kg    Flexibility  11 in    Single Leg Stand  12.37 seconds        Nutrition Therapy Plan and Nutrition Goals: Nutrition Therapy & Goals - 06/16/18 1202       Nutrition Therapy   Diet  heart healthy, carb modified       Personal Nutrition Goals   Nutrition Goal  Pt to identify and limit food sources of saturated fat, trans fat, refined carbohydrates and sodium  Personal Goal #2  Pt to identify food quantities necessary to achieve weight loss of 2-6 lbs. at graduation from cardiac rehab    Personal Goal #3  Pt able to name foods that affect blood glucose.      Intervention Plan   Intervention  Prescribe, educate and counsel regarding individualized specific dietary modifications aiming towards targeted core components such as weight, hypertension, lipid management, diabetes, heart failure and other comorbidities.    Expected Outcomes  Short Term Goal: Understand basic principles of dietary content, such as calories, fat, sodium, cholesterol and nutrients.;Long Term Goal: Adherence to prescribed nutrition plan.       Nutrition Assessments: Nutrition Assessments - 06/16/18 1204      MEDFICTS Scores   Pre Score  24       Nutrition Goals Re-Evaluation:   Nutrition Goals Re-Evaluation:   Nutrition Goals Discharge (Final Nutrition Goals Re-Evaluation):   Psychosocial: Target Goals: Acknowledge presence or absence of significant depression and/or stress, maximize coping skills, provide positive support system. Participant is able to verbalize types and ability to use techniques and skills needed for reducing stress and depression.  Initial Review & Psychosocial Screening: Initial Psych Review & Screening - 06/16/18 1022      Initial Review   Current issues with  None Identified      Family Dynamics   Good Support System?  Yes   wife     Barriers   Psychosocial barriers to participate in program  There are no identifiable barriers or psychosocial needs.      Screening Interventions   Interventions  Encouraged to exercise       Quality of Life Scores: Quality of Life - 06/16/18 1023      Quality of Life   Select  Quality of  Life      Quality of Life Scores   Health/Function Pre  27.1 %    Socioeconomic Pre  29.1 %    Psych/Spiritual Pre  24.6 %    Family Pre  27.6 %    GLOBAL Pre  27.1 %      Scores of 19 and below usually indicate a poorer quality of life in these areas.  A difference of  2-3 points is a clinically meaningful difference.  A difference of 2-3 points in the total score of the Quality of Life Index has been associated with significant improvement in overall quality of life, self-image, physical symptoms, and general health in studies assessing change in quality of life.  PHQ-9: Recent Review Flowsheet Data    Depression screen [facility] 2/9 06/22/2018   Decreased Interest 0   Down, Depressed, Hopeless 0   PHQ - 2 Score 0     Interpretation of Total Score  Total Score Depression Severity:  1-4 = Minimal depression, 5-9 = Mild depression, 10-14 = Moderate depression, 15-19 = Moderately severe depression, 20-27 = Severe depression   Psychosocial Evaluation and Intervention: Psychosocial Evaluation - 06/22/18 0759      Psychosocial Evaluation & Interventions   Interventions  Encouraged to exercise with the program and follow exercise prescription    Comments  No psychosocial interventions necessary. Gene enjoys shooting pool and golfing.    Expected Outcomes  Gene will maintain a positive outlook utilizing good coping skills.    Continue Psychosocial Services   No Follow up required       Psychosocial Re-Evaluation: Psychosocial Re-Evaluation    [place] Name 06/25/18 1228 07/22/18 1714  Psychosocial Re-Evaluation   Current issues with  None Identified  None Identified      Comments  No psychosocial interventions necessary.  No psychosocial interventions necessary.      Expected Outcomes  Gene will maintain a positive outlook.   Gene will maintain a positive outlook.       Interventions  Encouraged to attend Cardiac Rehabilitation for the exercise  Encouraged to attend Cardiac  Rehabilitation for the exercise      Continue Psychosocial Services   No Follow up required  No Follow up required         Psychosocial Discharge (Final Psychosocial Re-Evaluation): Psychosocial Re-Evaluation - 07/22/18 1714      Psychosocial Re-Evaluation   Current issues with  None Identified    Comments  No psychosocial interventions necessary.    Expected Outcomes  Gene will maintain a positive outlook.     Interventions  Encouraged to attend Cardiac Rehabilitation for the exercise    Continue Psychosocial Services   No Follow up required       Vocational Rehabilitation: Provide vocational rehab assistance to qualifying candidates.   Vocational Rehab Evaluation & Intervention: Vocational Rehab - 06/16/18 1023      Initial Vocational Rehab Evaluation & Intervention   Assessment shows need for Vocational Rehabilitation  No   retired       Education: Education Goals: Education classes will be provided on a weekly basis, covering required topics. Participant will state understanding/return demonstration of topics presented.  Learning Barriers/Preferences: Learning Barriers/Preferences - 06/16/18 1110      Learning Barriers/Preferences   Learning Barriers  Sight    Learning Preferences  Video;Skilled Demonstration;Individual Instruction       Education Topics: Count Your Pulse:  -Group instruction provided by verbal instruction, demonstration, patient participation and written materials to support subject.  Instructors address importance of being able to find your pulse and how to count your pulse when at home without a heart monitor.  Patients get hands on experience counting their pulse with staff help and individually.   Heart Attack, Angina, and Risk Factor Modification:  -Group instruction provided by verbal instruction, video, and written materials to support subject.  Instructors address signs and symptoms of angina and heart attacks.    Also discuss risk factors  for heart disease and how to make changes to improve heart health risk factors.   Functional Fitness:  -Group instruction provided by verbal instruction, demonstration, patient participation, and written materials to support subject.  Instructors address safety measures for doing things around the house.  Discuss how to get up and down off the floor, how to pick things up properly, how to safely get out of a chair without assistance, and balance training.   Meditation and Mindfulness:  -Group instruction provided by verbal instruction, patient participation, and written materials to support subject.  Instructor addresses importance of mindfulness and meditation practice to help reduce stress and improve awareness.  Instructor also leads participants through a meditation exercise.    CARDIAC REHAB PHASE II EXERCISE from 07/22/2018 in [place]  Date  07/01/18  Educator  [place]  Instruction Review Code  2- Demonstrated Understanding      Stretching for Flexibility and Mobility:  -Group instruction provided by verbal instruction, patient participation, and written materials to support subject.  Instructors lead participants through series of stretches that are designed to increase flexibility thus improving mobility.  These stretches are additional exercise for major muscle groups that are  typically performed during regular warm up and cool down.   Hands Only CPR:  -Group verbal, video, and participation provides a basic overview of AHA guidelines for community CPR. Role-play of emergencies allow participants the opportunity to practice calling for help and chest compression technique with discussion of AED use.   Hypertension: -Group verbal and written instruction that provides a basic overview of hypertension including the most recent diagnostic guidelines, risk factor reduction with self-care instructions and medication management.    Nutrition I class:  Heart Healthy Eating:  -Group instruction provided by PowerPoint slides, verbal discussion, and written materials to support subject matter. The instructor gives an explanation and review of the Therapeutic Lifestyle Changes diet recommendations, which includes a discussion on lipid goals, dietary fat, sodium, fiber, plant stanol/sterol esters, sugar, and the components of a well-balanced, healthy diet.   Nutrition II class: Lifestyle Skills:  -Group instruction provided by PowerPoint slides, verbal discussion, and written materials to support subject matter. The instructor gives an explanation and review of label reading, grocery shopping for heart health, heart healthy recipe modifications, and ways to make healthier choices when eating out.   Diabetes Question & Answer:  -Group instruction provided by PowerPoint slides, verbal discussion, and written materials to support subject matter. The instructor gives an explanation and review of diabetes co-morbidities, pre- and post-prandial blood glucose goals, pre-exercise blood glucose goals, signs, symptoms, and treatment of hypoglycemia and hyperglycemia, and foot care basics.   Diabetes Blitz:  -Group instruction provided by PowerPoint slides, verbal discussion, and written materials to support subject matter. The instructor gives an explanation and review of the physiology behind type 1 and type 2 diabetes, diabetes medications and rational behind using different medications, pre- and post-prandial blood glucose recommendations and Hemoglobin A1c goals, diabetes diet, and exercise including blood glucose guidelines for exercising safely.    Portion Distortion:  -Group instruction provided by PowerPoint slides, verbal discussion, written materials, and food models to support subject matter. The instructor gives an explanation of serving size versus portion size, changes in portions sizes over the last 20 years, and what consists of a serving from  each food group.   Stress Management:  -Group instruction provided by verbal instruction, video, and written materials to support subject matter.  Instructors review role of stress in heart disease and how to cope with stress positively.     CARDIAC REHAB PHASE II EXERCISE from 07/22/2018 in [place]  Date  07/17/18  Instruction Review Code  2- Demonstrated Understanding      Exercising on Your Own:  -Group instruction provided by verbal instruction, power point, and written materials to support subject.  Instructors discuss benefits of exercise, components of exercise, frequency and intensity of exercise, and end points for exercise.  Also discuss use of nitroglycerin and activating EMS.  Review options of places to exercise outside of rehab.  Review guidelines for sex with heart disease.   Cardiac Drugs I:  -Group instruction provided by verbal instruction and written materials to support subject.  Instructor reviews cardiac drug classes: antiplatelets, anticoagulants, beta blockers, and statins.  Instructor discusses reasons, side effects, and lifestyle considerations for each drug class.   CARDIAC REHAB PHASE II EXERCISE from 07/22/2018 in [place]  Date  06/24/18  Instruction Review Code  2- Demonstrated Understanding      Cardiac Drugs II:  -Group instruction provided by verbal instruction and written materials to support subject.  Instructor reviews cardiac  drug classes: angiotensin converting enzyme inhibitors (ACE-I), angiotensin II receptor blockers (ARBs), nitrates, and calcium channel blockers.  Instructor discusses reasons, side effects, and lifestyle considerations for each drug class.   Anatomy and Physiology of the Circulatory System:  Group verbal and written instruction and models provide basic cardiac anatomy and physiology, with the coronary electrical and arterial systems. Review of: AMI, Angina, Valve  disease, Heart Failure, Peripheral Artery Disease, Cardiac Arrhythmia, Pacemakers, and the ICD.   CARDIAC REHAB PHASE II EXERCISE from 07/22/2018 in [place]  Date  07/22/18  Educator  RN  Instruction Review Code  2- Demonstrated Understanding      Other Education:  -Group or individual verbal, written, or video instructions that support the educational goals of the cardiac rehab program.   Holiday Eating Survival Tips:  -Group instruction provided by PowerPoint slides, verbal discussion, and written materials to support subject matter. The instructor gives patients tips, tricks, and techniques to help them not only survive but enjoy the holidays despite the onslaught of food that accompanies the holidays.   Knowledge Questionnaire Score: Knowledge Questionnaire Score - 06/16/18 1023      Knowledge Questionnaire Score   Pre Score  21/24       Core Components/Risk Factors/Patient Goals at Admission: Personal Goals and Risk Factors at Admission - 06/16/18 1111      Core Components/Risk Factors/Patient Goals on Admission    Weight Management  Yes;Weight Maintenance    Intervention  Weight Management: Develop a combined nutrition and exercise program designed to reach desired caloric intake, while maintaining appropriate intake of nutrient and fiber, sodium and fats, and appropriate energy expenditure required for the weight goal.;Weight Management: Provide education and appropriate resources to help participant work on and attain dietary goals.    Admit Weight  170 lb 10.2 oz (77.4 kg)    Expected Outcomes  Short Term: Continue to assess and modify interventions until short term weight is achieved;Long Term: Adherence to nutrition and physical activity/exercise program aimed toward attainment of established weight goal;Weight Maintenance: Understanding of the daily nutrition guidelines, which includes 25-35% calories from fat, 7% or less cal from saturated  fats, less than 200mg  cholesterol, less than 1.5gm of sodium, & 5 or more servings of fruits and vegetables daily;Understanding recommendations for meals to include 15-35% energy as protein, 25-35% energy from fat, 35-60% energy from carbohydrates, less than 200mg  of dietary cholesterol, 20-35 gm of total fiber daily;Understanding of distribution of calorie intake throughout the day with the consumption of 4-5 meals/snacks    Hypertension  Yes    Intervention  Provide education on lifestyle modifcations including regular physical activity/exercise, weight management, moderate sodium restriction and increased consumption of fresh fruit, vegetables, and low fat dairy, alcohol moderation, and smoking cessation.;Monitor prescription use compliance.    Expected Outcomes  Long Term: Maintenance of blood pressure at goal levels.;Short Term: Continued assessment and intervention until BP is < 140/50mm HG in hypertensive participants. < 130/81mm HG in hypertensive participants with diabetes, heart failure or chronic kidney disease.    Lipids  Yes    Intervention  Provide education and support for participant on nutrition & aerobic/resistive exercise along with prescribed medications to achieve LDL 70mg , HDL >40mg .    Expected Outcomes  Short Term: Participant states understanding of desired cholesterol values and is compliant with medications prescribed. Participant is following exercise prescription and nutrition guidelines.;Long Term: Cholesterol controlled with medications as prescribed, with individualized exercise RX and with personalized nutrition plan.  Value goals: LDL < 70mg , HDL > 40 mg.    Stress  Yes    Intervention  Offer individual and/or small group education and counseling on adjustment to heart disease, stress management and health-related lifestyle change. Teach and support self-help strategies.;Refer participants experiencing significant psychosocial distress to appropriate mental health  specialists for further evaluation and treatment. When possible, include family members and significant others in education/counseling sessions.    Expected Outcomes  Short Term: Participant demonstrates changes in health-related behavior, relaxation and other stress management skills, ability to obtain effective social support, and compliance with psychotropic medications if prescribed.;Long Term: Emotional wellbeing is indicated by absence of clinically significant psychosocial distress or social isolation.       Core Components/Risk Factors/Patient Goals Review:  Goals and Risk Factor Review    Row Name 06/22/18 0802 07/22/18 1714           Core Components/Risk Factors/Patient Goals Review   Personal Goals Review  Weight Management/Obesity;Hypertension;Lipids;Stress  Weight Management/Obesity;Hypertension;Lipids;Stress      Review  Pt with multiple CAD RFs willing to participate in CR exercise.  Gene would like to be able to feel comfortable exercising at home.   Pt with multiple CAD RFs willing to participate in CR exercise.  Gene is tolerating exercise well.  VSS throughout exercise.       Expected Outcomes  Pt will continue to participate in CR exercise, nutrition, and lifestyle modification opportunities.   Pt will continue to participate in CR exercise, nutrition, and lifestyle modification opportunities.          Core Components/Risk Factors/Patient Goals at Discharge (Final Review):  Goals and Risk Factor Review - 07/22/18 1714      Core Components/Risk Factors/Patient Goals Review   Personal Goals Review  Weight Management/Obesity;Hypertension;Lipids;Stress    Review  Pt with multiple CAD RFs willing to participate in CR exercise.  Gene is tolerating exercise well.  VSS throughout exercise.     Expected Outcomes  Pt will continue to participate in CR exercise, nutrition, and lifestyle modification opportunities.        ITP Comments: ITP Comments    Row Name 06/16/18 0749  06/22/18 0757 07/22/18 1713       ITP Comments  Dr. Fransico Him, Medical Director   30 Day ITP Review. Pt started exercise today and tolerated it well.   30 Day ITP Review.  Gene is tolerating exercise well.  VSS throughout exercise.         Comments: See ITP Comments.

## 2018-07-24 ENCOUNTER — Encounter (HOSPITAL_COMMUNITY)
Admission: RE | Admit: 2018-07-24 | Discharge: 2018-07-24 | Disposition: A | Discharge status: Home / Self Care | Payer: Medicare Other | Source: Ambulatory Visit | Attending: Cardiology | Admitting: Cardiology

## 2018-07-24 DIAGNOSIS — I214 Non-ST elevation (NSTEMI) myocardial infarction: Secondary | ICD-10-CM | POA: Diagnosis not present

## 2018-07-24 DIAGNOSIS — Z955 Presence of coronary angioplasty implant and graft: Secondary | ICD-10-CM

## 2018-07-27 ENCOUNTER — Encounter (HOSPITAL_COMMUNITY)
Admission: RE | Admit: 2018-07-27 | Discharge: 2018-07-27 | Disposition: A | Discharge status: Home / Self Care | Payer: Medicare Other | Source: Ambulatory Visit | Attending: Cardiology | Admitting: Cardiology

## 2018-07-27 DIAGNOSIS — Z955 Presence of coronary angioplasty implant and graft: Secondary | ICD-10-CM | POA: Diagnosis not present

## 2018-07-27 DIAGNOSIS — I214 Non-ST elevation (NSTEMI) myocardial infarction: Secondary | ICD-10-CM

## 2018-07-29 ENCOUNTER — Encounter (HOSPITAL_COMMUNITY)
Admission: RE | Admit: 2018-07-29 | Discharge: 2018-07-29 | Disposition: A | Discharge status: Home / Self Care | Payer: Medicare Other | Source: Ambulatory Visit | Attending: Cardiology | Admitting: Cardiology

## 2018-07-29 ENCOUNTER — Encounter: Payer: Self-pay | Admitting: Cardiology

## 2018-07-29 DIAGNOSIS — Z955 Presence of coronary angioplasty implant and graft: Secondary | ICD-10-CM

## 2018-07-29 DIAGNOSIS — I214 Non-ST elevation (NSTEMI) myocardial infarction: Secondary | ICD-10-CM

## 2018-07-31 ENCOUNTER — Encounter (HOSPITAL_COMMUNITY)
Admission: RE | Admit: 2018-07-31 | Discharge: 2018-07-31 | Disposition: A | Discharge status: Home / Self Care | Payer: Medicare Other | Source: Ambulatory Visit | Attending: Cardiology | Admitting: Cardiology

## 2018-07-31 DIAGNOSIS — Z955 Presence of coronary angioplasty implant and graft: Secondary | ICD-10-CM | POA: Diagnosis not present

## 2018-07-31 DIAGNOSIS — I214 Non-ST elevation (NSTEMI) myocardial infarction: Secondary | ICD-10-CM

## 2018-08-03 ENCOUNTER — Encounter (HOSPITAL_COMMUNITY)
Admission: RE | Admit: 2018-08-03 | Discharge: 2018-08-03 | Disposition: A | Discharge status: Home / Self Care | Payer: Medicare Other | Source: Ambulatory Visit | Attending: Cardiology | Admitting: Cardiology

## 2018-08-03 DIAGNOSIS — Z955 Presence of coronary angioplasty implant and graft: Secondary | ICD-10-CM | POA: Diagnosis not present

## 2018-08-03 DIAGNOSIS — I214 Non-ST elevation (NSTEMI) myocardial infarction: Secondary | ICD-10-CM

## 2018-08-04 ENCOUNTER — Encounter (INDEPENDENT_AMBULATORY_CARE_PROVIDER_SITE_OTHER): Payer: Medicare Other | Admitting: Ophthalmology

## 2018-08-04 DIAGNOSIS — I10 Essential (primary) hypertension: Secondary | ICD-10-CM | POA: Diagnosis not present

## 2018-08-04 DIAGNOSIS — H35033 Hypertensive retinopathy, bilateral: Secondary | ICD-10-CM

## 2018-08-04 DIAGNOSIS — H353221 Exudative age-related macular degeneration, left eye, with active choroidal neovascularization: Secondary | ICD-10-CM

## 2018-08-04 DIAGNOSIS — H43813 Vitreous degeneration, bilateral: Secondary | ICD-10-CM

## 2018-08-04 DIAGNOSIS — H33301 Unspecified retinal break, right eye: Secondary | ICD-10-CM

## 2018-08-04 DIAGNOSIS — H353112 Nonexudative age-related macular degeneration, right eye, intermediate dry stage: Secondary | ICD-10-CM | POA: Diagnosis not present

## 2018-08-05 ENCOUNTER — Encounter (HOSPITAL_COMMUNITY)
Admission: RE | Admit: 2018-08-05 | Discharge: 2018-08-05 | Disposition: A | Discharge status: Home / Self Care | Payer: Medicare Other | Source: Ambulatory Visit | Attending: Cardiology | Admitting: Cardiology

## 2018-08-05 DIAGNOSIS — Z955 Presence of coronary angioplasty implant and graft: Secondary | ICD-10-CM

## 2018-08-05 DIAGNOSIS — I214 Non-ST elevation (NSTEMI) myocardial infarction: Secondary | ICD-10-CM | POA: Diagnosis not present

## 2018-08-07 ENCOUNTER — Encounter (HOSPITAL_COMMUNITY)
Admission: RE | Admit: 2018-08-07 | Discharge: 2018-08-07 | Disposition: A | Discharge status: Home / Self Care | Payer: Medicare Other | Source: Ambulatory Visit | Attending: Cardiology | Admitting: Cardiology

## 2018-08-07 DIAGNOSIS — I214 Non-ST elevation (NSTEMI) myocardial infarction: Secondary | ICD-10-CM

## 2018-08-07 DIAGNOSIS — Z955 Presence of coronary angioplasty implant and graft: Secondary | ICD-10-CM | POA: Diagnosis not present

## 2018-08-10 ENCOUNTER — Encounter (HOSPITAL_COMMUNITY)
Admission: RE | Admit: 2018-08-10 | Discharge: 2018-08-10 | Disposition: A | Discharge status: Home / Self Care | Payer: Medicare Other | Source: Ambulatory Visit | Attending: Cardiology | Admitting: Cardiology

## 2018-08-10 DIAGNOSIS — I214 Non-ST elevation (NSTEMI) myocardial infarction: Secondary | ICD-10-CM | POA: Diagnosis not present

## 2018-08-10 DIAGNOSIS — Z955 Presence of coronary angioplasty implant and graft: Secondary | ICD-10-CM

## 2018-08-10 NOTE — Progress Notes (Signed)
Cardiology Office Note   Date:  08/13/2018   ID:  Teodoro, Jeffreys Nov 26, 1933, MRN [id number]  PCP:  Reynold Bowen, MD  Cardiologist:   Minus Breeding, MD   Chief Complaint  Patient presents with  . Coronary Artery Disease      History of Present Illness: Kenneth Pope is a 85 y.o. male who presents for follow-up of coronary disease.  He was admitted late last year with syncope.  He had an elevated troponin.  He eventually had a cardiac catheterization demonstrating 50% stenosis in the LAD.  He had 85% stenosis in the right coronary artery.  He had DES to the RCA.  He was noted also to have probable coronary spasm in the LAD.  His syncope was thought to be related possibly to an arrhythmia and he was started on a beta-blocker.  ACE inhibitor was stopped.  He was doing well at the last follow-up.  He did have an event monitor and no arrhythmia was noted.  Since I last saw him he has done well.  The patient denies any new symptoms such as chest discomfort, neck or arm discomfort. There has been no new shortness of breath, PND or orthopnea. There have been no reported palpitations, presyncope or syncope.  He is doing cardiac rehab.     Past Medical History:  Diagnosis Date  . Arthritis   . CAD (coronary artery disease) 05/04/2018   a. 05/04/18 NSTEMI, 05/05/18 DES to dominant RCA  . Cancer ([place])    skin lesion  . Former tobacco use   . Hyperlipidemia   . Hypertension   . Macular degeneration     Past Surgical History:  Procedure Laterality Date  . CARDIAC CATHETERIZATION    . CORONARY STENT INTERVENTION N/A 05/05/2018   Procedure: CORONARY STENT INTERVENTION;  Surgeon: Troy Sine, MD;  Location: [facility];  Service: Cardiovascular;  Laterality: N/A;  . HERNIA REPAIR    . LEFT HEART CATH AND CORONARY ANGIOGRAPHY N/A 05/05/2018   Procedure: LEFT HEART CATH AND CORONARY ANGIOGRAPHY;  Surgeon: Troy Sine, MD;  Location: [facility];   Service: Cardiovascular;  Laterality: N/A;     Current Outpatient Medications  Medication Sig Dispense Refill  . amLODipine (NORVASC) 10 MG tablet Take 1 tablet (10 mg total) by mouth daily. 90 tablet 3  . aspirin EC 81 MG tablet Take 81 mg by mouth daily.    Marland Kitchen atorvastatin (LIPITOR) 80 MG tablet Take 1 tablet (80 mg total) by mouth daily at 6 PM. 90 tablet 3  . brimonidine (ALPHAGAN) 0.2 % ophthalmic solution Place 1 drop into both eyes 2 (two) times daily.    . calcium carbonate (OSCAL) 1500 (600 Ca) MG TABS tablet Take 600 mg of elemental calcium by mouth daily.    . carvedilol (COREG) 3.125 MG tablet Take 1 tablet (3.125 mg total) by mouth 2 (two) times daily with a meal. 90 tablet 3  . COMBIGAN 0.2-0.5 % ophthalmic solution Place 1 drop into both eyes 2 (two) times daily.  3  . Multiple Vitamin (MULTIVITAMIN WITH MINERALS) TABS tablet Take 1 tablet by mouth daily.    . Multiple Vitamins-Minerals (PRESERVISION AREDS 2) CAPS Take 1 capsule by mouth 2 (two) times daily.     . nitroGLYCERIN (NITROSTAT) 0.4 MG SL tablet Place 1 tablet (0.4 mg total) under the tongue every 5 (five) minutes as needed for chest pain. 25 tablet 12  . omega-3 acid ethyl  esters (LOVAZA) 1 g capsule Take 1 g by mouth 2 (two) times daily.    . ticagrelor (BRILINTA) 90 MG TABS tablet Take 1 tablet (90 mg total) by mouth 2 (two) times daily. 180 tablet 3  . timolol (TIMOPTIC) 0.5 % ophthalmic solution Place 1 drop into both eyes 2 (two) times daily.     No current facility-administered medications for this visit.     Allergies:   Patient has no known allergies.     ROS:  Please see the history of present illness.   Otherwise, review of systems are positive for fatigue.   All other systems are reviewed and negative.    PHYSICAL EXAM: VS:  BP 118/72   Pulse 66   Ht 5\' 8"  (1.727 m)   Wt 169 lb 9.6 oz (76.9 kg)   BMI 25.79 kg/m  , BMI Body mass index is 25.79 kg/m. GENERAL:  Well appearing NECK:  No jugular  venous distention, waveform within normal limits, carotid upstroke brisk and symmetric, no bruits, no thyromegaly LUNGS:  Clear to auscultation bilaterally CHEST:  Unremarkable HEART:  PMI not displaced or sustained,S1 and S2 within normal limits, no S3, no S4, no clicks, no rubs, no murmurs ABD:  Flat, positive bowel sounds normal in frequency in pitch, no bruits, no rebound, no guarding, no midline pulsatile mass, no hepatomegaly, no splenomegaly EXT:  2 plus pulses throughout, no edema, no cyanosis no clubbing    EKG:  EKG is not ordered today.   Recent Labs: 05/04/2018: ALT 16; TSH 1.748 05/06/2018: BUN 10; Creatinine, Ser 1.03; Hemoglobin 14.4; Magnesium 1.9; Platelets 202; Potassium 4.3; Sodium 136    Lipid Panel    Component Value Date/Time   CHOL 135 05/05/2018 0219   TRIG 51 05/05/2018 0219   HDL 56 05/05/2018 0219   CHOLHDL 2.4 05/05/2018 0219   VLDL 10 05/05/2018 0219   LDLCALC 69 05/05/2018 0219      Wt Readings from Last 3 Encounters:  08/13/18 169 lb 9.6 oz (76.9 kg)  06/16/18 170 lb 10.2 oz (77.4 kg)  05/16/18 172 lb 2.9 oz (78.1 kg)      Other studies Reviewed: Additional studies/ records that were reviewed today include: Labs. Review of the above records demonstrates:  Please see elsewhere in the note.     ASSESSMENT AND PLAN:  CAD: He has obstructive coronary disease treated as well as probable coronary spasm.  No change in therapy.  He can stop his Brilinta in May temporarily for tooth extraction.  I like him to restart it after that.  SYNCOPE: He has had no further episodes.  No change in therapy.  HTN: Blood pressures well controlled and he will continue the meds as listed.  DYSLIPIDEMIA: Lipids are at target with an LDL of 69 and HDL of 65.  He is on target therapy.  No change in medications.  Current medicines are reviewed at length with the patient today.  The patient does not have concerns regarding medicines.  The following changes have  been made:  no change  Labs/ tests ordered today include: None No orders of the defined types were placed in this encounter.    Disposition:   FU with me in one year.     Signed, Minus Breeding, MD  08/13/2018 8:24 AM    [facility]

## 2018-08-12 ENCOUNTER — Encounter (HOSPITAL_COMMUNITY)
Admission: RE | Admit: 2018-08-12 | Discharge: 2018-08-12 | Disposition: A | Discharge status: Home / Self Care | Payer: Medicare Other | Source: Ambulatory Visit | Attending: Cardiology | Admitting: Cardiology

## 2018-08-12 DIAGNOSIS — I214 Non-ST elevation (NSTEMI) myocardial infarction: Secondary | ICD-10-CM

## 2018-08-12 DIAGNOSIS — Z955 Presence of coronary angioplasty implant and graft: Secondary | ICD-10-CM | POA: Diagnosis not present

## 2018-08-13 ENCOUNTER — Encounter: Payer: Self-pay | Admitting: Cardiology

## 2018-08-13 ENCOUNTER — Ambulatory Visit: Payer: Medicare Other | Admitting: Cardiology

## 2018-08-13 VITALS — BP 118/72 | HR 66 | Ht 68.0 in | Wt 169.6 lb

## 2018-08-13 DIAGNOSIS — I2510 Atherosclerotic heart disease of native coronary artery without angina pectoris: Secondary | ICD-10-CM | POA: Diagnosis not present

## 2018-08-13 DIAGNOSIS — I10 Essential (primary) hypertension: Secondary | ICD-10-CM

## 2018-08-13 DIAGNOSIS — E7800 Pure hypercholesterolemia, unspecified: Secondary | ICD-10-CM

## 2018-08-13 NOTE — Patient Instructions (Signed)

## 2018-08-14 ENCOUNTER — Encounter (HOSPITAL_COMMUNITY)
Admission: RE | Admit: 2018-08-14 | Discharge: 2018-08-14 | Disposition: A | Discharge status: Home / Self Care | Payer: Medicare Other | Source: Ambulatory Visit | Attending: Cardiology | Admitting: Cardiology

## 2018-08-14 DIAGNOSIS — I214 Non-ST elevation (NSTEMI) myocardial infarction: Secondary | ICD-10-CM | POA: Diagnosis not present

## 2018-08-14 DIAGNOSIS — Z955 Presence of coronary angioplasty implant and graft: Secondary | ICD-10-CM

## 2018-08-17 ENCOUNTER — Encounter (HOSPITAL_COMMUNITY)
Admission: RE | Admit: 2018-08-17 | Discharge: 2018-08-17 | Disposition: A | Discharge status: Home / Self Care | Payer: Medicare Other | Source: Ambulatory Visit | Attending: Cardiology | Admitting: Cardiology

## 2018-08-17 DIAGNOSIS — I214 Non-ST elevation (NSTEMI) myocardial infarction: Secondary | ICD-10-CM | POA: Diagnosis not present

## 2018-08-17 DIAGNOSIS — Z955 Presence of coronary angioplasty implant and graft: Secondary | ICD-10-CM

## 2018-08-19 ENCOUNTER — Encounter (HOSPITAL_COMMUNITY)
Admission: RE | Admit: 2018-08-19 | Discharge: 2018-08-19 | Disposition: A | Discharge status: Home / Self Care | Payer: Medicare Other | Source: Ambulatory Visit | Attending: Cardiology | Admitting: Cardiology

## 2018-08-19 DIAGNOSIS — Z955 Presence of coronary angioplasty implant and graft: Secondary | ICD-10-CM

## 2018-08-19 DIAGNOSIS — I214 Non-ST elevation (NSTEMI) myocardial infarction: Secondary | ICD-10-CM

## 2018-08-20 NOTE — Progress Notes (Signed)
Cardiac Individual Treatment Plan  Patient Details  Name: Kenneth Pope MRN: [id number] Date of Birth: 1933-09-12 Referring Provider:     CARDIAC REHAB PHASE II ORIENTATION from 06/16/2018 in [place]  Referring Provider  Dr. Percival Spanish      Initial Encounter Date:    CARDIAC REHAB PHASE II ORIENTATION from 06/16/2018 in [place]  Date  06/16/18      Visit Diagnosis: 05/05/2018 Stented coronary artery S/P DES RCA  05/04/2018 NSTEMI (non-ST elevated myocardial infarction) [employer] [facility])  Patient's Home Medications on Admission:  Current Outpatient Medications:  .  amLODipine (NORVASC) 10 MG tablet, Take 1 tablet (10 mg total) by mouth daily., Disp: 90 tablet, Rfl: 3 .  aspirin EC 81 MG tablet, Take 81 mg by mouth daily., Disp: , Rfl:  .  atorvastatin (LIPITOR) 80 MG tablet, Take 1 tablet (80 mg total) by mouth daily at 6 PM., Disp: 90 tablet, Rfl: 3 .  brimonidine (ALPHAGAN) 0.2 % ophthalmic solution, Place 1 drop into both eyes 2 (two) times daily., Disp: , Rfl:  .  calcium carbonate (OSCAL) 1500 (600 Ca) MG TABS tablet, Take 600 mg of elemental calcium by mouth daily., Disp: , Rfl:  .  carvedilol (COREG) 3.125 MG tablet, Take 1 tablet (3.125 mg total) by mouth 2 (two) times daily with a meal., Disp: 90 tablet, Rfl: 3 .  COMBIGAN 0.2-0.5 % ophthalmic solution, Place 1 drop into both eyes 2 (two) times daily., Disp: , Rfl: 3 .  Multiple Vitamin (MULTIVITAMIN WITH MINERALS) TABS tablet, Take 1 tablet by mouth daily., Disp: , Rfl:  .  Multiple Vitamins-Minerals (PRESERVISION AREDS 2) CAPS, Take 1 capsule by mouth 2 (two) times daily. , Disp: , Rfl:  .  nitroGLYCERIN (NITROSTAT) 0.4 MG SL tablet, Place 1 tablet (0.4 mg total) under the tongue every 5 (five) minutes as needed for chest pain., Disp: 25 tablet, Rfl: 12 .  omega-3 acid ethyl esters (LOVAZA) 1 g capsule, Take 1 g by mouth 2 (two) times daily., Disp: , Rfl:  .   ticagrelor (BRILINTA) 90 MG TABS tablet, Take 1 tablet (90 mg total) by mouth 2 (two) times daily., Disp: 180 tablet, Rfl: 3 .  timolol (TIMOPTIC) 0.5 % ophthalmic solution, Place 1 drop into both eyes 2 (two) times daily., Disp: , Rfl:   Past Medical History: Past Medical History:  Diagnosis Date  . Arthritis   . CAD (coronary artery disease) 05/04/2018   a. 05/04/18 NSTEMI, 05/05/18 DES to dominant RCA  . Cancer ([place])    skin lesion  . Former tobacco use   . Hyperlipidemia   . Hypertension   . Macular degeneration     Tobacco Use: Social History   Tobacco Use  Smoking Status Former Smoker  . Years: 15.00  . Types: Cigarettes  . Last attempt to quit: 1976  . Years since quitting: 44.2  Smokeless Tobacco Never Used    Labs: Recent Chemical engineer    Labs for ITP Cardiac and Pulmonary Rehab Latest Ref Rng & Units 05/04/2018 05/05/2018   Cholestrol 0 - 200 mg/dL - 135   LDLCALC 0 - 99 mg/dL - 69   HDL >40 mg/dL - 56   Trlycerides <150 mg/dL - 51   Hemoglobin A1c 4.8 - 5.6 % 5.7(H) -      Capillary Blood Glucose: No results found for: GLUCAP   Exercise Target Goals: Exercise Program Goal: Individual exercise prescription set using results  from initial 6 min walk test and THRR while considering  patient's activity barriers and safety.   Exercise Prescription Goal: Initial exercise prescription builds to 30-45 minutes a day of aerobic activity, 2-3 days per week.  Home exercise guidelines will be given to patient during program as part of exercise prescription that the participant will acknowledge.  Activity Barriers & Risk Stratification: Activity Barriers & Cardiac Risk Stratification - 06/16/18 1109      Activity Barriers & Cardiac Risk Stratification   Activity Barriers  None    Cardiac Risk Stratification  High       6 Minute Walk: 6 Minute Walk    Row Name 06/16/18 1108         6 Minute Walk   Phase  Initial     Distance  1255 feet     Walk  Time  6 minutes     # of Rest Breaks  0     MPH  2.38     METS  2.33     RPE  9     VO2 Peak  8.14     Symptoms  No     Resting HR  72 bpm     Resting BP  120/60     Resting Oxygen Saturation   97 %     Exercise Oxygen Saturation  during 6 min walk  97 %     Max Ex. HR  106 bpm     Max Ex. BP  130/70     2 Minute Post BP  110/72        Oxygen Initial Assessment:   Oxygen Re-Evaluation:   Oxygen Discharge (Final Oxygen Re-Evaluation):   Initial Exercise Prescription: Initial Exercise Prescription - 06/16/18 1100      Date of Initial Exercise RX and Referring Provider   Date  06/16/18    Referring Provider  Dr. Percival Spanish    Expected Discharge Date  09/25/18      Treadmill   MPH  2.2    Grade  2    Minutes  10      Bike   Level  1.5    Watts  8    Minutes  10    METs  2.3      NuStep   Level  3    SPM  75    Minutes  10    METs  2.3      Prescription Details   Frequency (times per week)  3    Duration  Progress to 30 minutes of continuous aerobic without signs/symptoms of physical distress      Intensity   THRR 40-80% of Max Heartrate  55-110    Ratings of Perceived Exertion  11-13      Progression   Progression  Continue to progress workloads to maintain intensity without signs/symptoms of physical distress.      Resistance Training   Training Prescription  Yes    Weight  3 lbs.    Reps  10-15       Perform Capillary Blood Glucose checks as needed.  Exercise Prescription Changes: Exercise Prescription Changes    Row Name 06/22/18 0700 07/03/18 1635 07/13/18 1600 07/17/18 1000 07/27/18 1416     Response to Exercise   Blood Pressure (Admit)  100/70  112/64  124/64  108/64  134/80   Blood Pressure (Exercise)  134/70  128/58  138/70  122/70  132/70   Blood Pressure (Exit)  108/60  100/60  102/60  108/70  104/60   Heart Rate (Admit)  68 bpm  63 bpm  62 bpm  66 bpm  79 bpm   Heart Rate (Exercise)  94 bpm  87 bpm  92 bpm  96 bpm  103 bpm   Heart  Rate (Exit)  72 bpm  64 bpm  70 bpm  65 bpm  79 bpm   Rating of Perceived Exertion (Exercise)  11  11  11  12  11    Perceived Dyspnea (Exercise)  0  0  0  0  0   Symptoms  None  None  None  None  None   Comments  Pt oriented to exercise equipment  None  None  None  None   Duration  Progress to 30 minutes of  aerobic without signs/symptoms of physical distress  Progress to 30 minutes of  aerobic without signs/symptoms of physical distress  Progress to 30 minutes of  aerobic without signs/symptoms of physical distress  Continue with 30 min of aerobic exercise without signs/symptoms of physical distress.  Continue with 30 min of aerobic exercise without signs/symptoms of physical distress.   Intensity  THRR unchanged  THRR unchanged  THRR unchanged  THRR unchanged  THRR unchanged     Progression   Progression  Continue to progress workloads to maintain intensity without signs/symptoms of physical distress.  Continue to progress workloads to maintain intensity without signs/symptoms of physical distress.  Continue to progress workloads to maintain intensity without signs/symptoms of physical distress.  Continue to progress workloads to maintain intensity without signs/symptoms of physical distress.  Continue to progress workloads to maintain intensity without signs/symptoms of physical distress.   Average METs  2.63  3.43  3.5  3.8  3.8     Resistance Training   Training Prescription  Yes  Yes  Yes  Yes  Yes   Weight  3lbs  5lbs  5lbs  5lbs  6lbs   Reps  10-15  10-15  10-15  10-15  10-15   Time  10 Minutes  10 Minutes  10 Minutes  10 Minutes  10 Minutes     Treadmill   MPH  2.2  2.4  2.6  -  2.8   Grade  2  2  2   -  2   Minutes  10  10  10   -  10   METs  3.29  3.5  3.71  -  3.91     Bike   Level  1.5  1.5  -  -  -   Minutes  10  10  -  -  -   METs  2  2  -  -  -     Recumbant Bike   Level  -  1.5  2.7  2.7  2.7   Watts  -  30  35  40  40   Minutes  -  10  10  10  10    METs  -  2.9  2.9   3  3      NuStep   Level  3  3  3  4  4    SPM  85  95  95  100  100   Minutes  10  10  10  10  10    METs  2.6  3.9  3.9  4.5  4.5     Home Exercise Plan   Plans to continue exercise at  -  -  Home (comment) Walking  Home (comment) Walking  Home (comment) Walking   Frequency  -  -  Add 2 additional days to program exercise sessions.  Add 2 additional days to program exercise sessions.  Add 2 additional days to program exercise sessions.   Initial Home Exercises Provided  -  -  07/13/18  07/13/18  07/13/18   Row Name 08/07/18 1418             Response to Exercise   Blood Pressure (Admit)  102/70       Blood Pressure (Exercise)  128/70       Blood Pressure (Exit)  102/60       Heart Rate (Admit)  74 bpm       Heart Rate (Exercise)  102 bpm       Heart Rate (Exit)  73 bpm       Rating of Perceived Exertion (Exercise)  11       Perceived Dyspnea (Exercise)  0       Symptoms  None       Comments  None       Duration  Continue with 30 min of aerobic exercise without signs/symptoms of physical distress.       Intensity  THRR unchanged         Progression   Progression  Continue to progress workloads to maintain intensity without signs/symptoms of physical distress.       Average METs  3.84         Resistance Training   Training Prescription  Yes       Weight  6lbs       Reps  10-15       Time  10 Minutes         Treadmill   MPH  2.8       Grade  2       Minutes  10       METs  3.91         Recumbant Bike   Level  2.7       Watts  40       Minutes  10       METs  3         NuStep   Level  4       SPM  100       Minutes  10       METs  4.6         Home Exercise Plan   Plans to continue exercise at  Home (comment) Walking       Frequency  Add 2 additional days to program exercise sessions.       Initial Home Exercises Provided  07/13/18          Exercise Comments: Exercise Comments    Row Name 06/22/18 0758 07/13/18 1343 08/17/18 1036       Exercise  Comments  Pt's first day of exercise. Pt responded well to exericse workloads. Will continue to monitor and progress pt as tolerated.   Reveiwed HEP with pt. Pt is not currently exercising at home. Spoke with pt regarding adding exercise to off days. Pt verbalized understanding. Will continue to monitor and progress pt as tolerated.   Reviewed METs and Goals with pt. Pt is responding well to workloads. Will continue to monitor and progress pt as tolerated.         Exercise Goals and Review: Exercise Goals  [place] Name 06/16/18 1109             Exercise Goals   Increase Physical Activity  Yes       Intervention  Provide advice, education, support and counseling about physical activity/exercise needs.;Develop an individualized exercise prescription for aerobic and resistive training based on initial evaluation findings, risk stratification, comorbidities and participant's personal goals.       Expected Outcomes  Short Term: Attend rehab on a regular basis to increase amount of physical activity.       Increase Strength and Stamina  Yes       Intervention  Provide advice, education, support and counseling about physical activity/exercise needs.;Develop an individualized exercise prescription for aerobic and resistive training based on initial evaluation findings, risk stratification, comorbidities and participant's personal goals.       Expected Outcomes  Short Term: Increase workloads from initial exercise prescription for resistance, speed, and METs.       Able to understand and use rate of perceived exertion (RPE) scale  Yes       Intervention  Provide education and explanation on how to use RPE scale       Expected Outcomes  Short Term: Able to use RPE daily in rehab to express subjective intensity level;Long Term:  Able to use RPE to guide intensity level when exercising independently       Knowledge and understanding of Target Heart Rate Range (THRR)  Yes       Intervention  Provide  education and explanation of THRR including how the numbers were predicted and where they are located for reference       Expected Outcomes  Short Term: Able to state/look up THRR;Long Term: Able to use THRR to govern intensity when exercising independently;Short Term: Able to use daily as guideline for intensity in rehab       Able to check pulse independently  Yes       Intervention  Provide education and demonstration on how to check pulse in carotid and radial arteries.;Review the importance of being able to check your own pulse for safety during independent exercise       Expected Outcomes  Short Term: Able to explain why pulse checking is important during independent exercise;Long Term: Able to check pulse independently and accurately       Understanding of Exercise Prescription  Yes       Intervention  Provide education, explanation, and written materials on patient's individual exercise prescription       Expected Outcomes  Short Term: Able to explain program exercise prescription;Long Term: Able to explain home exercise prescription to exercise independently          Exercise Goals Re-Evaluation : Exercise Goals Re-Evaluation    Row Name 07/13/18 1344 08/17/18 1038           Exercise Goal Re-Evaluation   Exercise Goals Review  Increase Physical Activity;Able to understand and use rate of perceived exertion (RPE) scale;Knowledge and understanding of Target Heart Rate Range (THRR);Understanding of Exercise Prescription;Increase Strength and Stamina;Able to check pulse independently  Understanding of Exercise Prescription;Increase Physical Activity      Comments  Reveiwed HEP with pt. Also reviewed THRR, RPE Scale, weather conditions, endpoints of exercise, NTG use, warmup and cool down.   Pt responding well to workload increases. Pt increased Nustep level from 2 to 3. Since pt has started program, pt has increased confidence with walking on treadmill.       Expected Outcomes  Pt will plan  to walk for exercise 2-3 days a week for 30-45 minutes. Pt will continue to increase cardiovascular strength. Will continue to monitor and progress pt as tolerated.   Pt will continue to walk 2-3 days a week for 30 minutes. Pt is walking when he gets a break at work. Will continue to work with pt to increase strength and stamina.          Discharge Exercise Prescription (Final Exercise Prescription Changes): Exercise Prescription Changes - 08/07/18 1418      Response to Exercise   Blood Pressure (Admit)  102/70    Blood Pressure (Exercise)  128/70    Blood Pressure (Exit)  102/60    Heart Rate (Admit)  74 bpm    Heart Rate (Exercise)  102 bpm    Heart Rate (Exit)  73 bpm    Rating of Perceived Exertion (Exercise)  11    Perceived Dyspnea (Exercise)  0    Symptoms  None    Comments  None    Duration  Continue with 30 min of aerobic exercise without signs/symptoms of physical distress.    Intensity  THRR unchanged      Progression   Progression  Continue to progress workloads to maintain intensity without signs/symptoms of physical distress.    Average METs  3.84      Resistance Training   Training Prescription  Yes    Weight  6lbs    Reps  10-15    Time  10 Minutes      Treadmill   MPH  2.8    Grade  2    Minutes  10    METs  3.91      Recumbant Bike   Level  2.7    Watts  40    Minutes  10    METs  3      NuStep   Level  4    SPM  100    Minutes  10    METs  4.6      Home Exercise Plan   Plans to continue exercise at  Home (comment)   Walking   Frequency  Add 2 additional days to program exercise sessions.    Initial Home Exercises Provided  07/13/18       Nutrition:  Target Goals: Understanding of nutrition guidelines, daily intake of sodium 1500mg , cholesterol 200mg , calories 30% from fat and 7% or less from saturated fats, daily to have 5 or more servings of fruits and vegetables.  Biometrics: Pre Biometrics - 06/16/18 1110      Pre Biometrics    Height  5\' 8"  (1.727 m)    Weight  77.4 kg    Waist Circumference  40 inches    Hip Circumference  40 inches    Waist to Hip Ratio  1 %    BMI (Calculated)  25.95    Triceps Skinfold  30 mm    % Body Fat  30.3 %    Grip Strength  27 kg    Flexibility  11 in    Single Leg Stand  12.37 seconds        Nutrition Therapy Plan and Nutrition Goals: Nutrition Therapy & Goals - 06/16/18 1202      Nutrition Therapy   Diet  heart healthy, carb modified       Personal Nutrition Goals   Nutrition Goal  Pt to identify and limit food sources of saturated fat, trans fat, refined  carbohydrates and sodium    Personal Goal #2  Pt to identify food quantities necessary to achieve weight loss of 2-6 lbs. at graduation from cardiac rehab    Personal Goal #3  Pt able to name foods that affect blood glucose.      Intervention Plan   Intervention  Prescribe, educate and counsel regarding individualized specific dietary modifications aiming towards targeted core components such as weight, hypertension, lipid management, diabetes, heart failure and other comorbidities.    Expected Outcomes  Short Term Goal: Understand basic principles of dietary content, such as calories, fat, sodium, cholesterol and nutrients.;Long Term Goal: Adherence to prescribed nutrition plan.       Nutrition Assessments: Nutrition Assessments - 06/16/18 1204      MEDFICTS Scores   Pre Score  24       Nutrition Goals Re-Evaluation:   Nutrition Goals Re-Evaluation:   Nutrition Goals Discharge (Final Nutrition Goals Re-Evaluation):   Psychosocial: Target Goals: Acknowledge presence or absence of significant depression and/or stress, maximize coping skills, provide positive support system. Participant is able to verbalize types and ability to use techniques and skills needed for reducing stress and depression.  Initial Review & Psychosocial Screening: Initial Psych Review & Screening - 06/16/18 1022      Initial Review    Current issues with  None Identified      Family Dynamics   Good Support System?  Yes   wife     Barriers   Psychosocial barriers to participate in program  There are no identifiable barriers or psychosocial needs.      Screening Interventions   Interventions  Encouraged to exercise       Quality of Life Scores: Quality of Life - 06/16/18 1023      Quality of Life   Select  Quality of Life      Quality of Life Scores   Health/Function Pre  27.1 %    Socioeconomic Pre  29.1 %    Psych/Spiritual Pre  24.6 %    Family Pre  27.6 %    GLOBAL Pre  27.1 %      Scores of 19 and below usually indicate a poorer quality of life in these areas.  A difference of  2-3 points is a clinically meaningful difference.  A difference of 2-3 points in the total score of the Quality of Life Index has been associated with significant improvement in overall quality of life, self-image, physical symptoms, and general health in studies assessing change in quality of life.  PHQ-9: Recent Review Flowsheet Data    Depression screen [facility] 2/9 06/22/2018   Decreased Interest 0   Down, Depressed, Hopeless 0   PHQ - 2 Score 0     Interpretation of Total Score  Total Score Depression Severity:  1-4 = Minimal depression, 5-9 = Mild depression, 10-14 = Moderate depression, 15-19 = Moderately severe depression, 20-27 = Severe depression   Psychosocial Evaluation and Intervention: Psychosocial Evaluation - 06/22/18 0759      Psychosocial Evaluation & Interventions   Interventions  Encouraged to exercise with the program and follow exercise prescription    Comments  No psychosocial interventions necessary. Gene enjoys shooting pool and golfing.    Expected Outcomes  Gene will maintain a positive outlook utilizing good coping skills.    Continue Psychosocial Services   No Follow up required       Psychosocial Re-Evaluation: Psychosocial Re-Evaluation    [place] Name 06/25/18 1228 07/22/18 1714 08/19/18 1641  Psychosocial Re-Evaluation   Current issues with  None Identified  None Identified  None Identified     Comments  No psychosocial interventions necessary.  No psychosocial interventions necessary.  No psychosocial interventions necessary.     Expected Outcomes  Gene will maintain a positive outlook.   Gene will maintain a positive outlook.   Gene will maintain a positive outlook.      Interventions  Encouraged to attend Cardiac Rehabilitation for the exercise  Encouraged to attend Cardiac Rehabilitation for the exercise  -     Continue Psychosocial Services   No Follow up required  No Follow up required  No Follow up required        Psychosocial Discharge (Final Psychosocial Re-Evaluation): Psychosocial Re-Evaluation - 08/19/18 1641      Psychosocial Re-Evaluation   Current issues with  None Identified    Comments  No psychosocial interventions necessary.    Expected Outcomes  Gene will maintain a positive outlook.     Continue Psychosocial Services   No Follow up required       Vocational Rehabilitation: Provide vocational rehab assistance to qualifying candidates.   Vocational Rehab Evaluation & Intervention: Vocational Rehab - 06/16/18 1023      Initial Vocational Rehab Evaluation & Intervention   Assessment shows need for Vocational Rehabilitation  No   retired       Education: Education Goals: Education classes will be provided on a weekly basis, covering required topics. Participant will state understanding/return demonstration of topics presented.  Learning Barriers/Preferences: Learning Barriers/Preferences - 06/16/18 1110      Learning Barriers/Preferences   Learning Barriers  Sight    Learning Preferences  Video;Skilled Demonstration;Individual Instruction       Education Topics: Count Your Pulse:  -Group instruction provided by verbal instruction, demonstration, patient participation and written materials to support subject.  Instructors address  importance of being able to find your pulse and how to count your pulse when at home without a heart monitor.  Patients get hands on experience counting their pulse with staff help and individually.   Heart Attack, Angina, and Risk Factor Modification:  -Group instruction provided by verbal instruction, video, and written materials to support subject.  Instructors address signs and symptoms of angina and heart attacks.    Also discuss risk factors for heart disease and how to make changes to improve heart health risk factors.   Functional Fitness:  -Group instruction provided by verbal instruction, demonstration, patient participation, and written materials to support subject.  Instructors address safety measures for doing things around the house.  Discuss how to get up and down off the floor, how to pick things up properly, how to safely get out of a chair without assistance, and balance training.   Meditation and Mindfulness:  -Group instruction provided by verbal instruction, patient participation, and written materials to support subject.  Instructor addresses importance of mindfulness and meditation practice to help reduce stress and improve awareness.  Instructor also leads participants through a meditation exercise.    CARDIAC REHAB PHASE II EXERCISE from 07/22/2018 in [place]  Date  07/01/18  Educator  [place]  Instruction Review Code  2- Demonstrated Understanding      Stretching for Flexibility and Mobility:  -Group instruction provided by verbal instruction, patient participation, and written materials to support subject.  Instructors lead participants through series of stretches that are designed to increase flexibility thus improving mobility.  These stretches are additional exercise for major  muscle groups that are typically performed during regular warm up and cool down.   Hands Only CPR:  -Group verbal, video, and participation provides a  basic overview of AHA guidelines for community CPR. Role-play of emergencies allow participants the opportunity to practice calling for help and chest compression technique with discussion of AED use.   Hypertension: -Group verbal and written instruction that provides a basic overview of hypertension including the most recent diagnostic guidelines, risk factor reduction with self-care instructions and medication management.    Nutrition I class: Heart Healthy Eating:  -Group instruction provided by PowerPoint slides, verbal discussion, and written materials to support subject matter. The instructor gives an explanation and review of the Therapeutic Lifestyle Changes diet recommendations, which includes a discussion on lipid goals, dietary fat, sodium, fiber, plant stanol/sterol esters, sugar, and the components of a well-balanced, healthy diet.   Nutrition II class: Lifestyle Skills:  -Group instruction provided by PowerPoint slides, verbal discussion, and written materials to support subject matter. The instructor gives an explanation and review of label reading, grocery shopping for heart health, heart healthy recipe modifications, and ways to make healthier choices when eating out.   Diabetes Question & Answer:  -Group instruction provided by PowerPoint slides, verbal discussion, and written materials to support subject matter. The instructor gives an explanation and review of diabetes co-morbidities, pre- and post-prandial blood glucose goals, pre-exercise blood glucose goals, signs, symptoms, and treatment of hypoglycemia and hyperglycemia, and foot care basics.   Diabetes Blitz:  -Group instruction provided by PowerPoint slides, verbal discussion, and written materials to support subject matter. The instructor gives an explanation and review of the physiology behind type 1 and type 2 diabetes, diabetes medications and rational behind using different medications, pre- and post-prandial  blood glucose recommendations and Hemoglobin A1c goals, diabetes diet, and exercise including blood glucose guidelines for exercising safely.    Portion Distortion:  -Group instruction provided by PowerPoint slides, verbal discussion, written materials, and food models to support subject matter. The instructor gives an explanation of serving size versus portion size, changes in portions sizes over the last 20 years, and what consists of a serving from each food group.   Stress Management:  -Group instruction provided by verbal instruction, video, and written materials to support subject matter.  Instructors review role of stress in heart disease and how to cope with stress positively.     CARDIAC REHAB PHASE II EXERCISE from 07/22/2018 in [place]  Date  07/17/18  Instruction Review Code  2- Demonstrated Understanding      Exercising on Your Own:  -Group instruction provided by verbal instruction, power point, and written materials to support subject.  Instructors discuss benefits of exercise, components of exercise, frequency and intensity of exercise, and end points for exercise.  Also discuss use of nitroglycerin and activating EMS.  Review options of places to exercise outside of rehab.  Review guidelines for sex with heart disease.   Cardiac Drugs I:  -Group instruction provided by verbal instruction and written materials to support subject.  Instructor reviews cardiac drug classes: antiplatelets, anticoagulants, beta blockers, and statins.  Instructor discusses reasons, side effects, and lifestyle considerations for each drug class.   CARDIAC REHAB PHASE II EXERCISE from 07/22/2018 in [place]  Date  06/24/18  Instruction Review Code  2- Demonstrated Understanding      Cardiac Drugs II:  -Group instruction provided by verbal instruction and written materials to support subject.  Instructor reviews cardiac drug classes:  angiotensin converting enzyme inhibitors (ACE-I), angiotensin II receptor blockers (ARBs), nitrates, and calcium channel blockers.  Instructor discusses reasons, side effects, and lifestyle considerations for each drug class.   Anatomy and Physiology of the Circulatory System:  Group verbal and written instruction and models provide basic cardiac anatomy and physiology, with the coronary electrical and arterial systems. Review of: AMI, Angina, Valve disease, Heart Failure, Peripheral Artery Disease, Cardiac Arrhythmia, Pacemakers, and the ICD.   CARDIAC REHAB PHASE II EXERCISE from 07/22/2018 in [place]  Date  07/22/18  Educator  RN  Instruction Review Code  2- Demonstrated Understanding      Other Education:  -Group or individual verbal, written, or video instructions that support the educational goals of the cardiac rehab program.   Holiday Eating Survival Tips:  -Group instruction provided by PowerPoint slides, verbal discussion, and written materials to support subject matter. The instructor gives patients tips, tricks, and techniques to help them not only survive but enjoy the holidays despite the onslaught of food that accompanies the holidays.   Knowledge Questionnaire Score: Knowledge Questionnaire Score - 06/16/18 1023      Knowledge Questionnaire Score   Pre Score  21/24       Core Components/Risk Factors/Patient Goals at Admission: Personal Goals and Risk Factors at Admission - 06/16/18 1111      Core Components/Risk Factors/Patient Goals on Admission    Weight Management  Yes;Weight Maintenance    Intervention  Weight Management: Develop a combined nutrition and exercise program designed to reach desired caloric intake, while maintaining appropriate intake of nutrient and fiber, sodium and fats, and appropriate energy expenditure required for the weight goal.;Weight Management: Provide education and appropriate resources to help  participant work on and attain dietary goals.    Admit Weight  170 lb 10.2 oz (77.4 kg)    Expected Outcomes  Short Term: Continue to assess and modify interventions until short term weight is achieved;Long Term: Adherence to nutrition and physical activity/exercise program aimed toward attainment of established weight goal;Weight Maintenance: Understanding of the daily nutrition guidelines, which includes 25-35% calories from fat, 7% or less cal from saturated fats, less than 200mg  cholesterol, less than 1.5gm of sodium, & 5 or more servings of fruits and vegetables daily;Understanding recommendations for meals to include 15-35% energy as protein, 25-35% energy from fat, 35-60% energy from carbohydrates, less than 200mg  of dietary cholesterol, 20-35 gm of total fiber daily;Understanding of distribution of calorie intake throughout the day with the consumption of 4-5 meals/snacks    Hypertension  Yes    Intervention  Provide education on lifestyle modifcations including regular physical activity/exercise, weight management, moderate sodium restriction and increased consumption of fresh fruit, vegetables, and low fat dairy, alcohol moderation, and smoking cessation.;Monitor prescription use compliance.    Expected Outcomes  Long Term: Maintenance of blood pressure at goal levels.;Short Term: Continued assessment and intervention until BP is < 140/65mm HG in hypertensive participants. < 130/79mm HG in hypertensive participants with diabetes, heart failure or chronic kidney disease.    Lipids  Yes    Intervention  Provide education and support for participant on nutrition & aerobic/resistive exercise along with prescribed medications to achieve LDL 70mg , HDL >40mg .    Expected Outcomes  Short Term: Participant states understanding of desired cholesterol values and is compliant with medications prescribed. Participant is following exercise prescription and nutrition guidelines.;Long Term: Cholesterol controlled  with medications as prescribed, with individualized exercise RX and  with personalized nutrition plan. Value goals: LDL < 70mg , HDL > 40 mg.    Stress  Yes    Intervention  Offer individual and/or small group education and counseling on adjustment to heart disease, stress management and health-related lifestyle change. Teach and support self-help strategies.;Refer participants experiencing significant psychosocial distress to appropriate mental health specialists for further evaluation and treatment. When possible, include family members and significant others in education/counseling sessions.    Expected Outcomes  Short Term: Participant demonstrates changes in health-related behavior, relaxation and other stress management skills, ability to obtain effective social support, and compliance with psychotropic medications if prescribed.;Long Term: Emotional wellbeing is indicated by absence of clinically significant psychosocial distress or social isolation.       Core Components/Risk Factors/Patient Goals Review:  Goals and Risk Factor Review    Row Name 06/22/18 0802 07/22/18 1714 08/19/18 1642         Core Components/Risk Factors/Patient Goals Review   Personal Goals Review  Weight Management/Obesity;Hypertension;Lipids;Stress  Weight Management/Obesity;Hypertension;Lipids;Stress  Weight Management/Obesity;Hypertension;Lipids;Stress     Review  Pt with multiple CAD RFs willing to participate in CR exercise.  Gene would like to be able to feel comfortable exercising at home.   Pt with multiple CAD RFs willing to participate in CR exercise.  Gene is tolerating exercise well.  VSS throughout exercise.   Pt with multiple CAD RFs willing to participate in CR exercise.  Gene continues to tolerate exercise well.  He feels that he is gaining exercise and socialization.  He is enjoying CR.       Expected Outcomes  Pt will continue to participate in CR exercise, nutrition, and lifestyle modification  opportunities.   Pt will continue to participate in CR exercise, nutrition, and lifestyle modification opportunities.   Pt will continue to participate in CR exercise, nutrition, and lifestyle modification opportunities.         Core Components/Risk Factors/Patient Goals at Discharge (Final Review):  Goals and Risk Factor Review - 08/19/18 1642      Core Components/Risk Factors/Patient Goals Review   Personal Goals Review  Weight Management/Obesity;Hypertension;Lipids;Stress    Review  Pt with multiple CAD RFs willing to participate in CR exercise.  Gene continues to tolerate exercise well.  He feels that he is gaining exercise and socialization.  He is enjoying CR.      Expected Outcomes  Pt will continue to participate in CR exercise, nutrition, and lifestyle modification opportunities.        ITP Comments: ITP Comments    Row Name 06/16/18 0749 06/22/18 0757 07/22/18 1713 08/19/18 1640     ITP Comments  Dr. Fransico Him, Medical Director   30 Day ITP Review. Pt started exercise today and tolerated it well.   30 Day ITP Review.  Gene is tolerating exercise well.  VSS throughout exercise.   30 Day ITP Review.  Gene continues to tolerate exercise well.  He feels that he is gaining exercise and socialization.  He is enjoying CR.         Comments: See ITP Comments.

## 2018-08-21 ENCOUNTER — Encounter (HOSPITAL_COMMUNITY)
Admission: RE | Admit: 2018-08-21 | Discharge: 2018-08-21 | Disposition: A | Discharge status: Home / Self Care | Payer: Medicare Other | Source: Ambulatory Visit | Attending: Cardiology | Admitting: Cardiology

## 2018-08-21 DIAGNOSIS — Z955 Presence of coronary angioplasty implant and graft: Secondary | ICD-10-CM | POA: Diagnosis not present

## 2018-08-21 DIAGNOSIS — I214 Non-ST elevation (NSTEMI) myocardial infarction: Secondary | ICD-10-CM | POA: Diagnosis not present

## 2018-08-24 ENCOUNTER — Encounter (HOSPITAL_COMMUNITY)
Admission: RE | Admit: 2018-08-24 | Discharge: 2018-08-24 | Disposition: A | Discharge status: Home / Self Care | Payer: Medicare Other | Source: Ambulatory Visit | Attending: Cardiology | Admitting: Cardiology

## 2018-08-24 DIAGNOSIS — Z955 Presence of coronary angioplasty implant and graft: Secondary | ICD-10-CM

## 2018-08-24 DIAGNOSIS — I214 Non-ST elevation (NSTEMI) myocardial infarction: Secondary | ICD-10-CM

## 2018-08-26 ENCOUNTER — Encounter (HOSPITAL_COMMUNITY)
Admission: RE | Admit: 2018-08-26 | Discharge: 2018-08-26 | Disposition: A | Discharge status: Home / Self Care | Payer: Medicare Other | Source: Ambulatory Visit | Attending: Cardiology | Admitting: Cardiology

## 2018-08-26 ENCOUNTER — Other Ambulatory Visit: Payer: Self-pay

## 2018-08-26 VITALS — Ht 68.0 in | Wt 171.5 lb

## 2018-08-26 DIAGNOSIS — Z955 Presence of coronary angioplasty implant and graft: Secondary | ICD-10-CM | POA: Diagnosis not present

## 2018-08-26 DIAGNOSIS — I214 Non-ST elevation (NSTEMI) myocardial infarction: Secondary | ICD-10-CM | POA: Diagnosis not present

## 2018-08-28 ENCOUNTER — Encounter (HOSPITAL_COMMUNITY)
Admission: RE | Admit: 2018-08-28 | Discharge: 2018-08-28 | Disposition: A | Discharge status: Home / Self Care | Payer: Medicare Other | Source: Ambulatory Visit | Attending: Cardiology | Admitting: Cardiology

## 2018-08-28 ENCOUNTER — Other Ambulatory Visit: Payer: Self-pay

## 2018-08-28 DIAGNOSIS — Z955 Presence of coronary angioplasty implant and graft: Secondary | ICD-10-CM

## 2018-08-28 DIAGNOSIS — I214 Non-ST elevation (NSTEMI) myocardial infarction: Secondary | ICD-10-CM | POA: Diagnosis not present

## 2018-08-31 ENCOUNTER — Encounter (HOSPITAL_COMMUNITY)
Admission: RE | Admit: 2018-08-31 | Discharge: 2018-08-31 | Disposition: A | Discharge status: Home / Self Care | Payer: Medicare Other | Source: Ambulatory Visit | Attending: Cardiology | Admitting: Cardiology

## 2018-08-31 ENCOUNTER — Encounter (HOSPITAL_COMMUNITY): Payer: Self-pay

## 2018-08-31 ENCOUNTER — Other Ambulatory Visit: Payer: Self-pay

## 2018-08-31 DIAGNOSIS — Z955 Presence of coronary angioplasty implant and graft: Secondary | ICD-10-CM

## 2018-08-31 DIAGNOSIS — I214 Non-ST elevation (NSTEMI) myocardial infarction: Secondary | ICD-10-CM | POA: Diagnosis not present

## 2018-09-02 ENCOUNTER — Encounter (HOSPITAL_COMMUNITY): Payer: Medicare Other

## 2018-09-02 ENCOUNTER — Other Ambulatory Visit: Payer: Self-pay

## 2018-09-02 ENCOUNTER — Encounter (INDEPENDENT_AMBULATORY_CARE_PROVIDER_SITE_OTHER): Payer: Medicare Other | Admitting: Ophthalmology

## 2018-09-02 DIAGNOSIS — H353221 Exudative age-related macular degeneration, left eye, with active choroidal neovascularization: Secondary | ICD-10-CM | POA: Diagnosis not present

## 2018-09-02 DIAGNOSIS — H35033 Hypertensive retinopathy, bilateral: Secondary | ICD-10-CM

## 2018-09-02 DIAGNOSIS — I10 Essential (primary) hypertension: Secondary | ICD-10-CM | POA: Diagnosis not present

## 2018-09-02 DIAGNOSIS — H353112 Nonexudative age-related macular degeneration, right eye, intermediate dry stage: Secondary | ICD-10-CM

## 2018-09-02 DIAGNOSIS — H33301 Unspecified retinal break, right eye: Secondary | ICD-10-CM

## 2018-09-02 DIAGNOSIS — H43813 Vitreous degeneration, bilateral: Secondary | ICD-10-CM

## 2018-09-04 ENCOUNTER — Encounter (HOSPITAL_COMMUNITY): Payer: Medicare Other

## 2018-09-04 NOTE — Progress Notes (Signed)
Discharge Progress Report  Patient Details  Name: Kenneth Pope MRN: [id number] Date of Birth: March 19, 1934 Referring Provider:     CARDIAC REHAB PHASE II ORIENTATION from 06/16/2018 in [place]  Referring Provider  Dr. Percival Spanish       Number of Visits: 31  Reason for Discharge:  Patient reached a stable level of exercise. Patient independent in their exercise. Patient has met program and personal goals.  Smoking History:  Social History   Tobacco Use  Smoking Status Former Smoker  . Years: 15.00  . Types: Cigarettes  . Last attempt to quit: 1976  . Years since quitting: 44.2  Smokeless Tobacco Never Used    Diagnosis:  05/04/2018 NSTEMI (non-ST elevated myocardial infarction) ([place])  05/05/2018 Stented coronary artery S/P DES RCA  ADL UCSD:   Initial Exercise Prescription: Initial Exercise Prescription - 06/16/18 1100      Date of Initial Exercise RX and Referring Provider   Date  06/16/18    Referring Provider  Dr. Percival Spanish    Expected Discharge Date  09/25/18      Treadmill   MPH  2.2    Grade  2    Minutes  10      Bike   Level  1.5    Watts  8    Minutes  10    METs  2.3      NuStep   Level  3    SPM  75    Minutes  10    METs  2.3      Prescription Details   Frequency (times per week)  3    Duration  Progress to 30 minutes of continuous aerobic without signs/symptoms of physical distress      Intensity   THRR 40-80% of Max Heartrate  55-110    Ratings of Perceived Exertion  11-13      Progression   Progression  Continue to progress workloads to maintain intensity without signs/symptoms of physical distress.      Resistance Training   Training Prescription  Yes    Weight  3 lbs.    Reps  10-15       Discharge Exercise Prescription (Final Exercise Prescription Changes): Exercise Prescription Changes - 08/31/18 1040      Response to Exercise   Blood Pressure (Admit)  112/72    Blood Pressure  (Exercise)  148/68    Blood Pressure (Exit)  112/60    Heart Rate (Admit)  73 bpm    Heart Rate (Exercise)  98 bpm    Heart Rate (Exit)  75 bpm    Rating of Perceived Exertion (Exercise)  12    Perceived Dyspnea (Exercise)  0    Symptoms  None    Comments  Pt graduated from Cardiac Rehab     Duration  Continue with 30 min of aerobic exercise without signs/symptoms of physical distress.    Intensity  THRR unchanged      Progression   Progression  Continue to progress workloads to maintain intensity without signs/symptoms of physical distress.    Average METs  4.1      Resistance Training   Training Prescription  Yes    Weight  6lbs    Reps  10-15    Time  10 Minutes      Treadmill   MPH  2.8    Grade  2    Minutes  10    METs  3.91  Recumbant Bike   Level  3    Watts  40    Minutes  10    METs  3.1      NuStep   Level  5    SPM  100    Minutes  10    METs  5.2      Home Exercise Plan   Plans to continue exercise at  Home (comment)   Walking   Frequency  Add 3 additional days to program exercise sessions.    Initial Home Exercises Provided  07/13/18       Functional Capacity: 6 Minute Walk    Row Name 06/16/18 1108 08/26/18 1419       6 Minute Walk   Phase  Initial  Discharge    Distance  1255 feet  1445 feet    Distance % Change  -  15.14 %    Distance Feet Change  -  190 ft    Walk Time  6 minutes  6 minutes    # of Rest Breaks  0  0    MPH  2.38  2.74    METS  2.33  2.51    RPE  9  11    Perceived Dyspnea   -  0    VO2 Peak  8.14  8.8    Symptoms  No  No    Resting HR  72 bpm  73 bpm    Resting BP  120/60  120/60    Resting Oxygen Saturation   97 %  -    Exercise Oxygen Saturation  during 6 min walk  97 %  -    Max Ex. HR  106 bpm  96 bpm    Max Ex. BP  130/70  124/70    2 Minute Post BP  110/72  98/56       Psychological, QOL, Others - Outcomes: PHQ 2/9: Depression screen [facility] 2/9 08/31/2018 06/22/2018  Decreased Interest 0 0  Down,  Depressed, Hopeless 0 0  PHQ - 2 Score 0 0    Quality of Life: Quality of Life - 09/03/18 1050      Quality of Life Scores   Health/Function Pre  27.1 %    Health/Function Post  26.07 %    Health/Function % Change  -3.8 %    Socioeconomic Pre  29.1 %    Socioeconomic Post  27 %    Socioeconomic % Change   -7.22 %    Psych/Spiritual Pre  24.6 %    Psych/Spiritual Post  26.36 %    Psych/Spiritual % Change  7.15 %    Family Pre  27.6 %    Family Post  27.6 %    Family % Change  0 %    GLOBAL Pre  27.1 %    GLOBAL Post  26.53 %    GLOBAL % Change  -2.1 %       Personal Goals: Goals established at orientation with interventions provided to work toward goal. Personal Goals and Risk Factors at Admission - 06/16/18 1111      Core Components/Risk Factors/Patient Goals on Admission    Weight Management  Yes;Weight Maintenance    Intervention  Weight Management: Develop a combined nutrition and exercise program designed to reach desired caloric intake, while maintaining appropriate intake of nutrient and fiber, sodium and fats, and appropriate energy expenditure required for the weight goal.;Weight Management: Provide education and appropriate resources to help participant work  on and attain dietary goals.    Admit Weight  170 lb 10.2 oz (77.4 kg)    Expected Outcomes  Short Term: Continue to assess and modify interventions until short term weight is achieved;Long Term: Adherence to nutrition and physical activity/exercise program aimed toward attainment of established weight goal;Weight Maintenance: Understanding of the daily nutrition guidelines, which includes 25-35% calories from fat, 7% or less cal from saturated fats, less than 277m cholesterol, less than 1.5gm of sodium, & 5 or more servings of fruits and vegetables daily;Understanding recommendations for meals to include 15-35% energy as protein, 25-35% energy from fat, 35-60% energy from carbohydrates, less than 2027mof dietary  cholesterol, 20-35 gm of total fiber daily;Understanding of distribution of calorie intake throughout the day with the consumption of 4-5 meals/snacks    Hypertension  Yes    Intervention  Provide education on lifestyle modifcations including regular physical activity/exercise, weight management, moderate sodium restriction and increased consumption of fresh fruit, vegetables, and low fat dairy, alcohol moderation, and smoking cessation.;Monitor prescription use compliance.    Expected Outcomes  Long Term: Maintenance of blood pressure at goal levels.;Short Term: Continued assessment and intervention until BP is < 140/9024mG in hypertensive participants. < 130/11m24m in hypertensive participants with diabetes, heart failure or chronic kidney disease.    Lipids  Yes    Intervention  Provide education and support for participant on nutrition & aerobic/resistive exercise along with prescribed medications to achieve LDL <70mg37mL >40mg.1mExpected Outcomes  Short Term: Participant states understanding of desired cholesterol values and is compliant with medications prescribed. Participant is following exercise prescription and nutrition guidelines.;Long Term: Cholesterol controlled with medications as prescribed, with individualized exercise RX and with personalized nutrition plan. Value goals: LDL < 70mg, 22m> 40 mg.    Stress  Yes    Intervention  Offer individual and/or small group education and counseling on adjustment to heart disease, stress management and health-related lifestyle change. Teach and support self-help strategies.;Refer participants experiencing significant psychosocial distress to appropriate mental health specialists for further evaluation and treatment. When possible, include family members and significant others in education/counseling sessions.    Expected Outcomes  Short Term: Participant demonstrates changes in health-related behavior, relaxation and other stress management  skills, ability to obtain effective social support, and compliance with psychotropic medications if prescribed.;Long Term: Emotional wellbeing is indicated by absence of clinically significant psychosocial distress or social isolation.        Personal Goals Discharge: Goals and Risk Factor Review    Row Name 06/22/18 0802 07/22/18 1714 08/19/18 1642 09/02/18 1011       Core Components/Risk Factors/Patient Goals Review   Personal Goals Review  Weight Management/Obesity;Hypertension;Lipids;Stress  Weight Management/Obesity;Hypertension;Lipids;Stress  Weight Management/Obesity;Hypertension;Lipids;Stress  Weight Management/Obesity;Hypertension;Lipids;Stress    Review  Pt with multiple CAD RFs willing to participate in CR exercise.  Gene would like to be able to feel comfortable exercising at home.   Pt with multiple CAD RFs willing to participate in CR exercise.  Gene is tolerating exercise well.  VSS throughout exercise.   Pt with multiple CAD RFs willing to participate in CR exercise.  Gene continues to tolerate exercise well.  He feels that he is gaining exercise and socialization.  He is enjoying CR.    Pt graduated from CR [facility] after 31 complete sessions.  Pt enjoyed participating in CR and felt that he gained an exercise routine.     Expected Outcomes  Pt will continue to participate in  CR exercise, nutrition, and lifestyle modification opportunities.   Pt will continue to participate in CR exercise, nutrition, and lifestyle modification opportunities.   Pt will continue to participate in CR exercise, nutrition, and lifestyle modification opportunities.   Pt will continue to participate in exercise, nutrition, and lifestyle modification opportunities. Pt plans to join the fitness center near his home.        Exercise Goals and Review: Exercise Goals    Row Name 06/16/18 1109             Exercise Goals   Increase Physical Activity  Yes       Intervention  Provide advice, education,  support and counseling about physical activity/exercise needs.;Develop an individualized exercise prescription for aerobic and resistive training based on initial evaluation findings, risk stratification, comorbidities and participant's personal goals.       Expected Outcomes  Short Term: Attend rehab on a regular basis to increase amount of physical activity.       Increase Strength and Stamina  Yes       Intervention  Provide advice, education, support and counseling about physical activity/exercise needs.;Develop an individualized exercise prescription for aerobic and resistive training based on initial evaluation findings, risk stratification, comorbidities and participant's personal goals.       Expected Outcomes  Short Term: Increase workloads from initial exercise prescription for resistance, speed, and METs.       Able to understand and use rate of perceived exertion (RPE) scale  Yes       Intervention  Provide education and explanation on how to use RPE scale       Expected Outcomes  Short Term: Able to use RPE daily in rehab to express subjective intensity level;Long Term:  Able to use RPE to guide intensity level when exercising independently       Knowledge and understanding of Target Heart Rate Range (THRR)  Yes       Intervention  Provide education and explanation of THRR including how the numbers were predicted and where they are located for reference       Expected Outcomes  Short Term: Able to state/look up THRR;Long Term: Able to use THRR to govern intensity when exercising independently;Short Term: Able to use daily as guideline for intensity in rehab       Able to check pulse independently  Yes       Intervention  Provide education and demonstration on how to check pulse in carotid and radial arteries.;Review the importance of being able to check your own pulse for safety during independent exercise       Expected Outcomes  Short Term: Able to explain why pulse checking is important  during independent exercise;Long Term: Able to check pulse independently and accurately       Understanding of Exercise Prescription  Yes       Intervention  Provide education, explanation, and written materials on patient's individual exercise prescription       Expected Outcomes  Short Term: Able to explain program exercise prescription;Long Term: Able to explain home exercise prescription to exercise independently          Exercise Goals Re-Evaluation: Exercise Goals Re-Evaluation    Row Name 07/13/18 1344 08/17/18 1038 09/03/18 1032         Exercise Goal Re-Evaluation   Exercise Goals Review  Increase Physical Activity;Able to understand and use rate of perceived exertion (RPE) scale;Knowledge and understanding of Target Heart Rate Range (THRR);Understanding of Exercise Prescription;Increase Strength  and Stamina;Able to check pulse independently  Understanding of Exercise Prescription;Increase Physical Activity  Understanding of Exercise Prescription;Increase Physical Activity     Comments  Reveiwed HEP with pt. Also reviewed THRR, RPE Scale, weather conditions, endpoints of exercise, NTG use, warmup and cool down.   Pt responding well to workload increases. Pt increased Nustep level from 2 to 3. Since pt has started program, pt has increased confidence with walking on treadmill.   Pt completed 32 sessions. Pt increased functional capacity by 15.14%. Pt increased post 6MWT distance by 150f. One of pt's goals was to increase confidence with exercising at gym. Throughout the program, the pt has been able to develop an exercise routine and increase comfortability with exercising on his own.      Expected Outcomes  Pt will plan to walk for exercise 2-3 days a week for 30-45 minutes. Pt will continue to increase cardiovascular strength. Will continue to monitor and progress pt as tolerated.   Pt will continue to walk 2-3 days a week for 30 minutes. Pt is walking when he gets a break at work. Will  continue to work with pt to increase strength and stamina.   Pt will continue to exericse 2-3 days a week for 30 minutes, with walking.         Nutrition & Weight - Outcomes: Pre Biometrics - 06/16/18 1110      Pre Biometrics   Height  '5\' 8"'  (1.727 m)    Weight  77.4 kg    Waist Circumference  40 inches    Hip Circumference  40 inches    Waist to Hip Ratio  1 %    BMI (Calculated)  25.95    Triceps Skinfold  30 mm    % Body Fat  30.3 %    Grip Strength  27 kg    Flexibility  11 in    Single Leg Stand  12.37 seconds      Post Biometrics - 08/26/18 1422       Post  Biometrics   Height  '5\' 8"'  (1.727 m)    Weight  77.8 kg    Waist Circumference  39 inches    Hip Circumference  40.5 inches    Waist to Hip Ratio  0.96 %    BMI (Calculated)  26.09    Triceps Skinfold  27 mm    % Body Fat  29.4 %    Grip Strength  26 kg    Flexibility  11 in    Single Leg Stand  5 seconds       Nutrition: Nutrition Therapy & Goals - 09/03/18 1148      Nutrition Therapy   Diet  heart healthy, carb modified       Personal Nutrition Goals   Nutrition Goal  Pt to identify and limit food sources of saturated fat, trans fat, refined carbohydrates and sodium    Personal Goal #2  Pt to identify food quantities necessary to achieve weight loss of 2-6 lbs. at graduation from cardiac rehab    Personal Goal #3  Pt able to name foods that affect blood glucose.      Intervention Plan   Intervention  Prescribe, educate and counsel regarding individualized specific dietary modifications aiming towards targeted core components such as weight, hypertension, lipid management, diabetes, heart failure and other comorbidities.    Expected Outcomes  Short Term Goal: Understand basic principles of dietary content, such as calories, fat, sodium, cholesterol and nutrients.;Long Term  Goal: Adherence to prescribed nutrition plan.       Nutrition Discharge: Nutrition Assessments - 09/03/18 1147      MEDFICTS  Scores   Pre Score  24    Post Score  24    Score Difference  0       Education Questionnaire Score: Knowledge Questionnaire Score - 09/03/18 1050      Knowledge Questionnaire Score   Pre Score  21/24    Post Score  20/24       Goals reviewed with patient; copy given to patient.

## 2018-09-07 ENCOUNTER — Encounter (HOSPITAL_COMMUNITY): Payer: Medicare Other

## 2018-09-09 ENCOUNTER — Encounter (HOSPITAL_COMMUNITY): Payer: Medicare Other

## 2018-09-11 ENCOUNTER — Encounter (HOSPITAL_COMMUNITY): Payer: Medicare Other

## 2018-09-14 ENCOUNTER — Encounter (HOSPITAL_COMMUNITY): Payer: Medicare Other

## 2018-09-16 ENCOUNTER — Encounter (HOSPITAL_COMMUNITY): Payer: Medicare Other

## 2018-09-18 ENCOUNTER — Encounter (HOSPITAL_COMMUNITY): Payer: Medicare Other

## 2018-09-21 ENCOUNTER — Encounter (HOSPITAL_COMMUNITY): Payer: Medicare Other

## 2018-09-23 ENCOUNTER — Encounter (HOSPITAL_COMMUNITY): Payer: Medicare Other

## 2018-09-23 ENCOUNTER — Telehealth (HOSPITAL_COMMUNITY): Payer: Self-pay | Admitting: Cardiac Rehabilitation

## 2018-09-23 NOTE — Telephone Encounter (Signed)
Pt phone call to advise of continued Outpatient  Cardiac Rehab departmental closure for COVID 19 precautions.  Unknown date for reopening. Pt advised to continue exercising on her own, following home exercise guidelines.  Pt states he is exercising at home.  Pt instructed to notify MD PRN symptoms.  Pt denies food insecurity at this time.   Pt offered emotional support and reassurance.  Pt expressed gratitude for the call.  Pt looking forward to resuming CRPII when program available.  Andi Hence, RN, BSN Cardiac Pulmonary Rehab

## 2018-09-30 ENCOUNTER — Encounter (INDEPENDENT_AMBULATORY_CARE_PROVIDER_SITE_OTHER): Payer: Medicare Other | Admitting: Ophthalmology

## 2018-09-30 ENCOUNTER — Other Ambulatory Visit: Payer: Self-pay

## 2018-09-30 DIAGNOSIS — I10 Essential (primary) hypertension: Secondary | ICD-10-CM

## 2018-09-30 DIAGNOSIS — H43813 Vitreous degeneration, bilateral: Secondary | ICD-10-CM

## 2018-09-30 DIAGNOSIS — H353221 Exudative age-related macular degeneration, left eye, with active choroidal neovascularization: Secondary | ICD-10-CM

## 2018-09-30 DIAGNOSIS — H353112 Nonexudative age-related macular degeneration, right eye, intermediate dry stage: Secondary | ICD-10-CM | POA: Diagnosis not present

## 2018-09-30 DIAGNOSIS — H35033 Hypertensive retinopathy, bilateral: Secondary | ICD-10-CM

## 2018-09-30 DIAGNOSIS — H33301 Unspecified retinal break, right eye: Secondary | ICD-10-CM

## 2018-10-26 DIAGNOSIS — H401122 Primary open-angle glaucoma, left eye, moderate stage: Secondary | ICD-10-CM | POA: Diagnosis not present

## 2018-10-26 DIAGNOSIS — H401111 Primary open-angle glaucoma, right eye, mild stage: Secondary | ICD-10-CM | POA: Diagnosis not present

## 2018-10-27 ENCOUNTER — Other Ambulatory Visit: Payer: Self-pay

## 2018-10-27 ENCOUNTER — Encounter (INDEPENDENT_AMBULATORY_CARE_PROVIDER_SITE_OTHER): Payer: Medicare Other | Admitting: Ophthalmology

## 2018-10-27 DIAGNOSIS — H353221 Exudative age-related macular degeneration, left eye, with active choroidal neovascularization: Secondary | ICD-10-CM

## 2018-10-27 DIAGNOSIS — I10 Essential (primary) hypertension: Secondary | ICD-10-CM | POA: Diagnosis not present

## 2018-10-27 DIAGNOSIS — H33301 Unspecified retinal break, right eye: Secondary | ICD-10-CM

## 2018-10-27 DIAGNOSIS — H35033 Hypertensive retinopathy, bilateral: Secondary | ICD-10-CM | POA: Diagnosis not present

## 2018-10-27 DIAGNOSIS — H353112 Nonexudative age-related macular degeneration, right eye, intermediate dry stage: Secondary | ICD-10-CM | POA: Diagnosis not present

## 2018-10-27 DIAGNOSIS — H43813 Vitreous degeneration, bilateral: Secondary | ICD-10-CM

## 2018-11-24 ENCOUNTER — Other Ambulatory Visit: Payer: Self-pay

## 2018-11-24 ENCOUNTER — Encounter (INDEPENDENT_AMBULATORY_CARE_PROVIDER_SITE_OTHER): Payer: Medicare Other | Admitting: Ophthalmology

## 2018-11-24 DIAGNOSIS — H35033 Hypertensive retinopathy, bilateral: Secondary | ICD-10-CM | POA: Diagnosis not present

## 2018-11-24 DIAGNOSIS — H353221 Exudative age-related macular degeneration, left eye, with active choroidal neovascularization: Secondary | ICD-10-CM

## 2018-11-24 DIAGNOSIS — H43813 Vitreous degeneration, bilateral: Secondary | ICD-10-CM

## 2018-11-24 DIAGNOSIS — I10 Essential (primary) hypertension: Secondary | ICD-10-CM | POA: Diagnosis not present

## 2018-11-24 DIAGNOSIS — H353112 Nonexudative age-related macular degeneration, right eye, intermediate dry stage: Secondary | ICD-10-CM | POA: Diagnosis not present

## 2018-12-22 ENCOUNTER — Encounter (INDEPENDENT_AMBULATORY_CARE_PROVIDER_SITE_OTHER): Payer: Medicare Other | Admitting: Ophthalmology

## 2018-12-22 ENCOUNTER — Other Ambulatory Visit: Payer: Self-pay

## 2018-12-22 DIAGNOSIS — I10 Essential (primary) hypertension: Secondary | ICD-10-CM | POA: Diagnosis not present

## 2018-12-22 DIAGNOSIS — H353221 Exudative age-related macular degeneration, left eye, with active choroidal neovascularization: Secondary | ICD-10-CM | POA: Diagnosis not present

## 2018-12-22 DIAGNOSIS — H35033 Hypertensive retinopathy, bilateral: Secondary | ICD-10-CM

## 2018-12-22 DIAGNOSIS — H43813 Vitreous degeneration, bilateral: Secondary | ICD-10-CM

## 2018-12-22 DIAGNOSIS — H33301 Unspecified retinal break, right eye: Secondary | ICD-10-CM

## 2018-12-22 DIAGNOSIS — H353112 Nonexudative age-related macular degeneration, right eye, intermediate dry stage: Secondary | ICD-10-CM

## 2018-12-30 ENCOUNTER — Encounter: Payer: Self-pay | Admitting: *Deleted

## 2018-12-31 ENCOUNTER — Telehealth: Payer: Self-pay | Admitting: Cardiology

## 2018-12-31 NOTE — Progress Notes (Signed)
Cardiology Office Note   Date:  01/01/2019   ID:  Kenneth Pope [birth year], MRN [id number]  PCP:  Reynold Bowen, MD  Cardiologist:   Minus Breeding, MD   Chief Complaint  Patient presents with  . Coronary Artery Disease      History of Present Illness: Kenneth Pope is a 85 y.o. male who presents for follow-up of coronary disease.  He was admitted late last year with syncope.  He had an elevated troponin.  He eventually had a cardiac catheterization demonstrating 50% stenosis in the LAD.  He had 85% stenosis in the right coronary artery.  He had DES to the RCA.  He was noted also to have probable coronary spasm in the LAD.  His syncope was thought to be related possibly to an arrhythmia and he was started on a beta-blocker.  ACE inhibitor was stopped.  He did have an event monitor and no arrhythmia was noted.    Since he was last seen he is done well.  He does cardio walks twice daily. The patient denies any new symptoms such as chest discomfort, neck or arm discomfort. There has been no new shortness of breath, PND or orthopnea. There have been no reported palpitations, presyncope or syncope.  He needs some dental extractions.  Past Medical History:  Diagnosis Date  . Arthritis   . BCC (basal cell carcinoma of skin) 12/08/2013   Bridge of Nose and Right Ear  . CAD (coronary artery disease) 05/04/2018   a. 05/04/18 NSTEMI, 05/05/18 DES to dominant RCA  . Cancer ([place])    skin lesion  . Former tobacco use   . Hyperlipidemia   . Hypertension   . Macular degeneration   . 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 Nodular basal cell carcinoma (BCC) 09/08/2017   Above Left Ear and Tip Left Nose  . SCC (squamous cell carcinoma) 05/23/2016   Left Cheek - Well Diff  . 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 CARDIAC CATHETERIZATION    . CORONARY STENT  INTERVENTION N/A 05/05/2018   Procedure: CORONARY STENT INTERVENTION;  Surgeon: Troy Sine, MD;  Location: [facility];  Service: Cardiovascular;  Laterality: N/A;  . HERNIA REPAIR    . LEFT HEART CATH AND CORONARY ANGIOGRAPHY N/A 05/05/2018   Procedure: LEFT HEART CATH AND CORONARY ANGIOGRAPHY;  Surgeon: Troy Sine, MD;  Location: [facility];  Service: Cardiovascular;  Laterality: N/A;     Current Outpatient Medications  Medication Sig Dispense Refill  . amLODipine (NORVASC) 10 MG tablet Take 1 tablet (10 mg total) by mouth daily. 90 tablet 3  . aspirin EC 81 MG tablet Take 81 mg by mouth daily.    Marland Kitchen atorvastatin (LIPITOR) 80 MG tablet Take 1 tablet (80 mg total) by mouth daily at 6 PM. 90 tablet 3  . carvedilol (COREG) 3.125 MG tablet Take 1 tablet (3.125 mg total) by mouth 2 (two) times daily with a meal. 90 tablet 3  . COMBIGAN 0.2-0.5 % ophthalmic solution Place 1 drop into both eyes 2 (two) times daily.  3  . Multiple Vitamin (MULTIVITAMIN WITH MINERALS) TABS tablet Take 1 tablet by mouth daily.    . Multiple Vitamins-Minerals (PRESERVISION AREDS 2) CAPS Take 1 capsule by mouth 2 (two) times daily.     . nitroGLYCERIN (NITROSTAT) 0.4 MG  SL tablet Place 1 tablet (0.4 mg total) under the tongue every 5 (five) minutes as needed for chest pain. 25 tablet 12  . omega-3 acid ethyl esters (LOVAZA) 1 g capsule Take 1 g by mouth 2 (two) times daily.    . timolol (TIMOPTIC) 0.5 % ophthalmic solution Place 1 drop into both eyes 2 (two) times daily.     No current facility-administered medications for this visit.     Allergies:   Patient has no known allergies.     ROS:  Please see the history of present illness.   Otherwise, review of systems are positive for none.   All other systems are reviewed and negative.    PHYSICAL EXAM: VS:  BP (!) 142/84   Pulse 70   Temp 97.9 F (36.6 C)   Ht 5\' 8"  (1.727 m)   Wt 171 lb 9.6 oz (77.8 kg)   BMI 26.09 kg/m  , BMI  Body mass index is 26.09 kg/m. GENERAL:  Well appearing NECK:  No jugular venous distention, waveform within normal limits, carotid upstroke brisk and symmetric, no bruits, no thyromegaly LUNGS:  Clear to auscultation bilaterally CHEST:  Unremarkable HEART:  PMI not displaced or sustained,S1 and S2 within normal limits, no S3, no S4, no clicks, no rubs, no murmurs ABD:  Flat, positive bowel sounds normal in frequency in pitch, no bruits, no rebound, no guarding, no midline pulsatile mass, no hepatomegaly, no splenomegaly EXT:  2 plus pulses throughout, mild ankle edema, no cyanosis no clubbing   EKG:  EKG is not ordered today.   Recent Labs: 05/04/2018: ALT 16; TSH 1.748 05/06/2018: BUN 10; Creatinine, Ser 1.03; Hemoglobin 14.4; Magnesium 1.9; Platelets 202; Potassium 4.3; Sodium 136    Lipid Panel    Component Value Date/Time   CHOL 135 05/05/2018 0219   TRIG 51 05/05/2018 0219   HDL 56 05/05/2018 0219   CHOLHDL 2.4 05/05/2018 0219   VLDL 10 05/05/2018 0219   LDLCALC 69 05/05/2018 0219      Wt Readings from Last 3 Encounters:  01/01/19 171 lb 9.6 oz (77.8 kg)  08/26/18 171 lb 8.3 oz (77.8 kg)  08/13/18 169 lb 9.6 oz (76.9 kg)      Other studies Reviewed: Additional studies/ records that were reviewed today include: None. Review of the above records demonstrates:  Please see elsewhere in the note.     ASSESSMENT AND PLAN:  CAD:   The patient is having no new symptoms.  Is been greater than 6 months.  He has had some dental extractions.  Stop his Brilinta.  I do not think he needs to resume this.  He was told not to stop his aspirin however.  HTN:   Blood pressure is slightly elevated right now but he says he takes it at home and today's reading is excellent and it usually is.  No change in therapy.  DYSLIPIDEMIA: His lipids were excellent last year.  No change in therapy.   EDEMA: He has some mild edema likely related to the Norvasc.  It does not bother him.  He  will not make any changes to his medications.  Current medicines are reviewed at length with the patient today.  The patient does not have concerns regarding medicines.  The following changes have been made:  None  Labs/ tests ordered today include: None No orders of the defined types were placed in this encounter.    Disposition:   FU with me in one year.  Signed, Minus Breeding, MD  01/01/2019 5:52 PM    [facility]

## 2018-12-31 NOTE — Telephone Encounter (Signed)

## 2019-01-01 ENCOUNTER — Encounter: Payer: Self-pay | Admitting: Cardiology

## 2019-01-01 ENCOUNTER — Ambulatory Visit (INDEPENDENT_AMBULATORY_CARE_PROVIDER_SITE_OTHER): Payer: Medicare Other | Admitting: Cardiology

## 2019-01-01 ENCOUNTER — Other Ambulatory Visit: Payer: Self-pay

## 2019-01-01 VITALS — BP 142/84 | HR 70 | Temp 97.9°F | Ht 68.0 in | Wt 171.6 lb

## 2019-01-01 DIAGNOSIS — I2510 Atherosclerotic heart disease of native coronary artery without angina pectoris: Secondary | ICD-10-CM | POA: Diagnosis not present

## 2019-01-01 DIAGNOSIS — E7800 Pure hypercholesterolemia, unspecified: Secondary | ICD-10-CM

## 2019-01-01 DIAGNOSIS — I10 Essential (primary) hypertension: Secondary | ICD-10-CM

## 2019-01-01 NOTE — Patient Instructions (Signed)
Medication Instructions:  STOP BRILINTA  If you need a refill on your cardiac medications before your next appointment, please call your pharmacy.   Lab work: NONE  Testing/Procedures: NONE  Follow-Up: At [facility], you and your health needs are our priority.  As part of our continuing mission to provide you with exceptional heart care, we have created designated Provider Care Teams.  These Care Teams include your primary Cardiologist (physician) and Advanced Practice Providers (APPs -  Physician Assistants and Nurse Practitioners) who all work together to provide you with the care you need, when you need it. You will need a follow up appointment in 12 months.  Please call our office 2 months in advance to schedule this appointment.  You may see Minus Breeding, MD or one of the following Advanced Practice Providers on your designated Care Team:   Rosaria Ferries, PA-C . Jory Sims, DNP, ANP

## 2019-01-19 ENCOUNTER — Encounter (INDEPENDENT_AMBULATORY_CARE_PROVIDER_SITE_OTHER): Payer: Medicare Other | Admitting: Ophthalmology

## 2019-01-20 ENCOUNTER — Other Ambulatory Visit: Payer: Self-pay

## 2019-01-20 ENCOUNTER — Encounter (INDEPENDENT_AMBULATORY_CARE_PROVIDER_SITE_OTHER): Payer: Medicare Other | Admitting: Ophthalmology

## 2019-01-20 DIAGNOSIS — H35033 Hypertensive retinopathy, bilateral: Secondary | ICD-10-CM | POA: Diagnosis not present

## 2019-01-20 DIAGNOSIS — H43813 Vitreous degeneration, bilateral: Secondary | ICD-10-CM

## 2019-01-20 DIAGNOSIS — H33301 Unspecified retinal break, right eye: Secondary | ICD-10-CM

## 2019-01-20 DIAGNOSIS — I10 Essential (primary) hypertension: Secondary | ICD-10-CM

## 2019-01-20 DIAGNOSIS — H353112 Nonexudative age-related macular degeneration, right eye, intermediate dry stage: Secondary | ICD-10-CM | POA: Diagnosis not present

## 2019-01-20 DIAGNOSIS — H353221 Exudative age-related macular degeneration, left eye, with active choroidal neovascularization: Secondary | ICD-10-CM | POA: Diagnosis not present

## 2019-01-27 DIAGNOSIS — R7301 Impaired fasting glucose: Secondary | ICD-10-CM | POA: Diagnosis not present

## 2019-01-27 DIAGNOSIS — Z125 Encounter for screening for malignant neoplasm of prostate: Secondary | ICD-10-CM | POA: Diagnosis not present

## 2019-01-27 DIAGNOSIS — R82998 Other abnormal findings in urine: Secondary | ICD-10-CM | POA: Diagnosis not present

## 2019-01-27 DIAGNOSIS — I10 Essential (primary) hypertension: Secondary | ICD-10-CM | POA: Diagnosis not present

## 2019-02-04 DIAGNOSIS — H3530 Unspecified macular degeneration: Secondary | ICD-10-CM | POA: Diagnosis not present

## 2019-02-04 DIAGNOSIS — E785 Hyperlipidemia, unspecified: Secondary | ICD-10-CM | POA: Diagnosis not present

## 2019-02-04 DIAGNOSIS — Z0000 Encounter for general adult medical examination without abnormal findings: Secondary | ICD-10-CM | POA: Diagnosis not present

## 2019-02-04 DIAGNOSIS — I2510 Atherosclerotic heart disease of native coronary artery without angina pectoris: Secondary | ICD-10-CM | POA: Diagnosis not present

## 2019-02-05 DIAGNOSIS — Z23 Encounter for immunization: Secondary | ICD-10-CM | POA: Diagnosis not present

## 2019-02-15 ENCOUNTER — Other Ambulatory Visit: Payer: Self-pay

## 2019-02-15 ENCOUNTER — Encounter (INDEPENDENT_AMBULATORY_CARE_PROVIDER_SITE_OTHER): Payer: Medicare Other | Admitting: Ophthalmology

## 2019-02-15 DIAGNOSIS — H35033 Hypertensive retinopathy, bilateral: Secondary | ICD-10-CM | POA: Diagnosis not present

## 2019-02-15 DIAGNOSIS — H353112 Nonexudative age-related macular degeneration, right eye, intermediate dry stage: Secondary | ICD-10-CM | POA: Diagnosis not present

## 2019-02-15 DIAGNOSIS — I10 Essential (primary) hypertension: Secondary | ICD-10-CM | POA: Diagnosis not present

## 2019-02-15 DIAGNOSIS — H353221 Exudative age-related macular degeneration, left eye, with active choroidal neovascularization: Secondary | ICD-10-CM

## 2019-02-15 DIAGNOSIS — H33301 Unspecified retinal break, right eye: Secondary | ICD-10-CM

## 2019-02-15 DIAGNOSIS — H43813 Vitreous degeneration, bilateral: Secondary | ICD-10-CM

## 2019-02-16 DIAGNOSIS — Z0120 Encounter for dental examination and cleaning without abnormal findings: Secondary | ICD-10-CM | POA: Diagnosis not present

## 2019-03-15 ENCOUNTER — Other Ambulatory Visit: Payer: Self-pay

## 2019-03-15 ENCOUNTER — Encounter (INDEPENDENT_AMBULATORY_CARE_PROVIDER_SITE_OTHER): Payer: Medicare Other | Admitting: Ophthalmology

## 2019-03-15 DIAGNOSIS — H353221 Exudative age-related macular degeneration, left eye, with active choroidal neovascularization: Secondary | ICD-10-CM

## 2019-03-15 DIAGNOSIS — H353112 Nonexudative age-related macular degeneration, right eye, intermediate dry stage: Secondary | ICD-10-CM | POA: Diagnosis not present

## 2019-03-15 DIAGNOSIS — H35033 Hypertensive retinopathy, bilateral: Secondary | ICD-10-CM

## 2019-03-15 DIAGNOSIS — I10 Essential (primary) hypertension: Secondary | ICD-10-CM | POA: Diagnosis not present

## 2019-03-15 DIAGNOSIS — H33301 Unspecified retinal break, right eye: Secondary | ICD-10-CM

## 2019-03-15 DIAGNOSIS — H43813 Vitreous degeneration, bilateral: Secondary | ICD-10-CM

## 2019-04-12 ENCOUNTER — Encounter (INDEPENDENT_AMBULATORY_CARE_PROVIDER_SITE_OTHER): Payer: Medicare Other | Admitting: Ophthalmology

## 2019-04-12 DIAGNOSIS — I10 Essential (primary) hypertension: Secondary | ICD-10-CM

## 2019-04-12 DIAGNOSIS — H35033 Hypertensive retinopathy, bilateral: Secondary | ICD-10-CM

## 2019-04-12 DIAGNOSIS — H353112 Nonexudative age-related macular degeneration, right eye, intermediate dry stage: Secondary | ICD-10-CM

## 2019-04-12 DIAGNOSIS — H33301 Unspecified retinal break, right eye: Secondary | ICD-10-CM

## 2019-04-12 DIAGNOSIS — H353221 Exudative age-related macular degeneration, left eye, with active choroidal neovascularization: Secondary | ICD-10-CM

## 2019-04-12 DIAGNOSIS — H43813 Vitreous degeneration, bilateral: Secondary | ICD-10-CM

## 2019-04-20 ENCOUNTER — Other Ambulatory Visit: Payer: Self-pay | Admitting: Cardiology

## 2019-04-20 MED ORDER — CARVEDILOL 3.125 MG PO TABS: 3.1250 mg | ORAL_TABLET | Freq: Two times a day (BID) | ORAL | 2 refills | Status: DC

## 2019-04-20 NOTE — Telephone Encounter (Signed)
°*  STAT* If patient is at the pharmacy, call can be transferred to refill team.   1. Which medications need to be refilled? (please list name of each medication and dose if known)  Carvedilol   2. Which pharmacy/location (including street and city if local pharmacy) is medication to be sent to? [facility]. [address] and [address], [place]  3. Do they need a 30 day or 90 day supply? 90 days and refills

## 2019-04-28 DIAGNOSIS — H401122 Primary open-angle glaucoma, left eye, moderate stage: Secondary | ICD-10-CM | POA: Diagnosis not present

## 2019-04-28 DIAGNOSIS — H401131 Primary open-angle glaucoma, bilateral, mild stage: Secondary | ICD-10-CM | POA: Diagnosis not present

## 2019-05-10 ENCOUNTER — Encounter (INDEPENDENT_AMBULATORY_CARE_PROVIDER_SITE_OTHER): Payer: Medicare Other | Admitting: Ophthalmology

## 2019-05-10 DIAGNOSIS — I10 Essential (primary) hypertension: Secondary | ICD-10-CM

## 2019-05-10 DIAGNOSIS — H35033 Hypertensive retinopathy, bilateral: Secondary | ICD-10-CM

## 2019-05-10 DIAGNOSIS — H43813 Vitreous degeneration, bilateral: Secondary | ICD-10-CM

## 2019-05-10 DIAGNOSIS — H353112 Nonexudative age-related macular degeneration, right eye, intermediate dry stage: Secondary | ICD-10-CM

## 2019-05-10 DIAGNOSIS — H353221 Exudative age-related macular degeneration, left eye, with active choroidal neovascularization: Secondary | ICD-10-CM

## 2019-05-12 ENCOUNTER — Other Ambulatory Visit: Payer: Self-pay | Admitting: Cardiology

## 2019-06-07 ENCOUNTER — Encounter (INDEPENDENT_AMBULATORY_CARE_PROVIDER_SITE_OTHER): Payer: Medicare Other | Admitting: Ophthalmology

## 2019-06-07 DIAGNOSIS — H43813 Vitreous degeneration, bilateral: Secondary | ICD-10-CM

## 2019-06-07 DIAGNOSIS — H33301 Unspecified retinal break, right eye: Secondary | ICD-10-CM

## 2019-06-07 DIAGNOSIS — I10 Essential (primary) hypertension: Secondary | ICD-10-CM

## 2019-06-07 DIAGNOSIS — H353221 Exudative age-related macular degeneration, left eye, with active choroidal neovascularization: Secondary | ICD-10-CM

## 2019-06-07 DIAGNOSIS — H35033 Hypertensive retinopathy, bilateral: Secondary | ICD-10-CM | POA: Diagnosis not present

## 2019-06-07 DIAGNOSIS — H353112 Nonexudative age-related macular degeneration, right eye, intermediate dry stage: Secondary | ICD-10-CM

## 2019-07-05 ENCOUNTER — Other Ambulatory Visit: Payer: Self-pay

## 2019-07-05 ENCOUNTER — Encounter (INDEPENDENT_AMBULATORY_CARE_PROVIDER_SITE_OTHER): Payer: Medicare Other | Admitting: Ophthalmology

## 2019-07-05 DIAGNOSIS — H35033 Hypertensive retinopathy, bilateral: Secondary | ICD-10-CM | POA: Diagnosis not present

## 2019-07-05 DIAGNOSIS — H353221 Exudative age-related macular degeneration, left eye, with active choroidal neovascularization: Secondary | ICD-10-CM

## 2019-07-05 DIAGNOSIS — H353112 Nonexudative age-related macular degeneration, right eye, intermediate dry stage: Secondary | ICD-10-CM | POA: Diagnosis not present

## 2019-07-05 DIAGNOSIS — I10 Essential (primary) hypertension: Secondary | ICD-10-CM

## 2019-07-05 DIAGNOSIS — H43813 Vitreous degeneration, bilateral: Secondary | ICD-10-CM

## 2019-07-05 DIAGNOSIS — H33301 Unspecified retinal break, right eye: Secondary | ICD-10-CM

## 2019-07-06 ENCOUNTER — Other Ambulatory Visit: Payer: Self-pay | Admitting: Adult Health

## 2019-07-12 ENCOUNTER — Other Ambulatory Visit: Payer: Self-pay | Admitting: Cardiology

## 2019-07-12 MED ORDER — ATORVASTATIN CALCIUM 80 MG PO TABS: 80.0000 mg | ORAL_TABLET | Freq: Every day | ORAL | 1 refills | Status: DC

## 2019-07-12 NOTE — Telephone Encounter (Signed)
*  STAT* If patient is at the pharmacy, call can be transferred to refill team.   1. Which medications need to be refilled? (please list name of each medication and dose if known)  atorvastatin (LIPITOR) 80 MG tablet  2. Which pharmacy/location (including street and city if local pharmacy) is medication to be sent to? [place], [place] - [address] AT [place]  3. Do they need a 30 day or 90 day supply? 90 with refills  Patient went to the pharmacy to pick up his RX, but the pharmacy said the request was denied. Patient has about a weeks worth of medicine left. HE just wants to get the new rx before he runs out

## 2019-07-16 IMAGING — DX DG CHEST 2V
2 series · 2 of 2 positions shown · non-contrast
Comparison: None.

CLINICAL DATA: Syncopal episode while exercising.

EXAM:
CHEST - 2 VIEW

[chest pa]
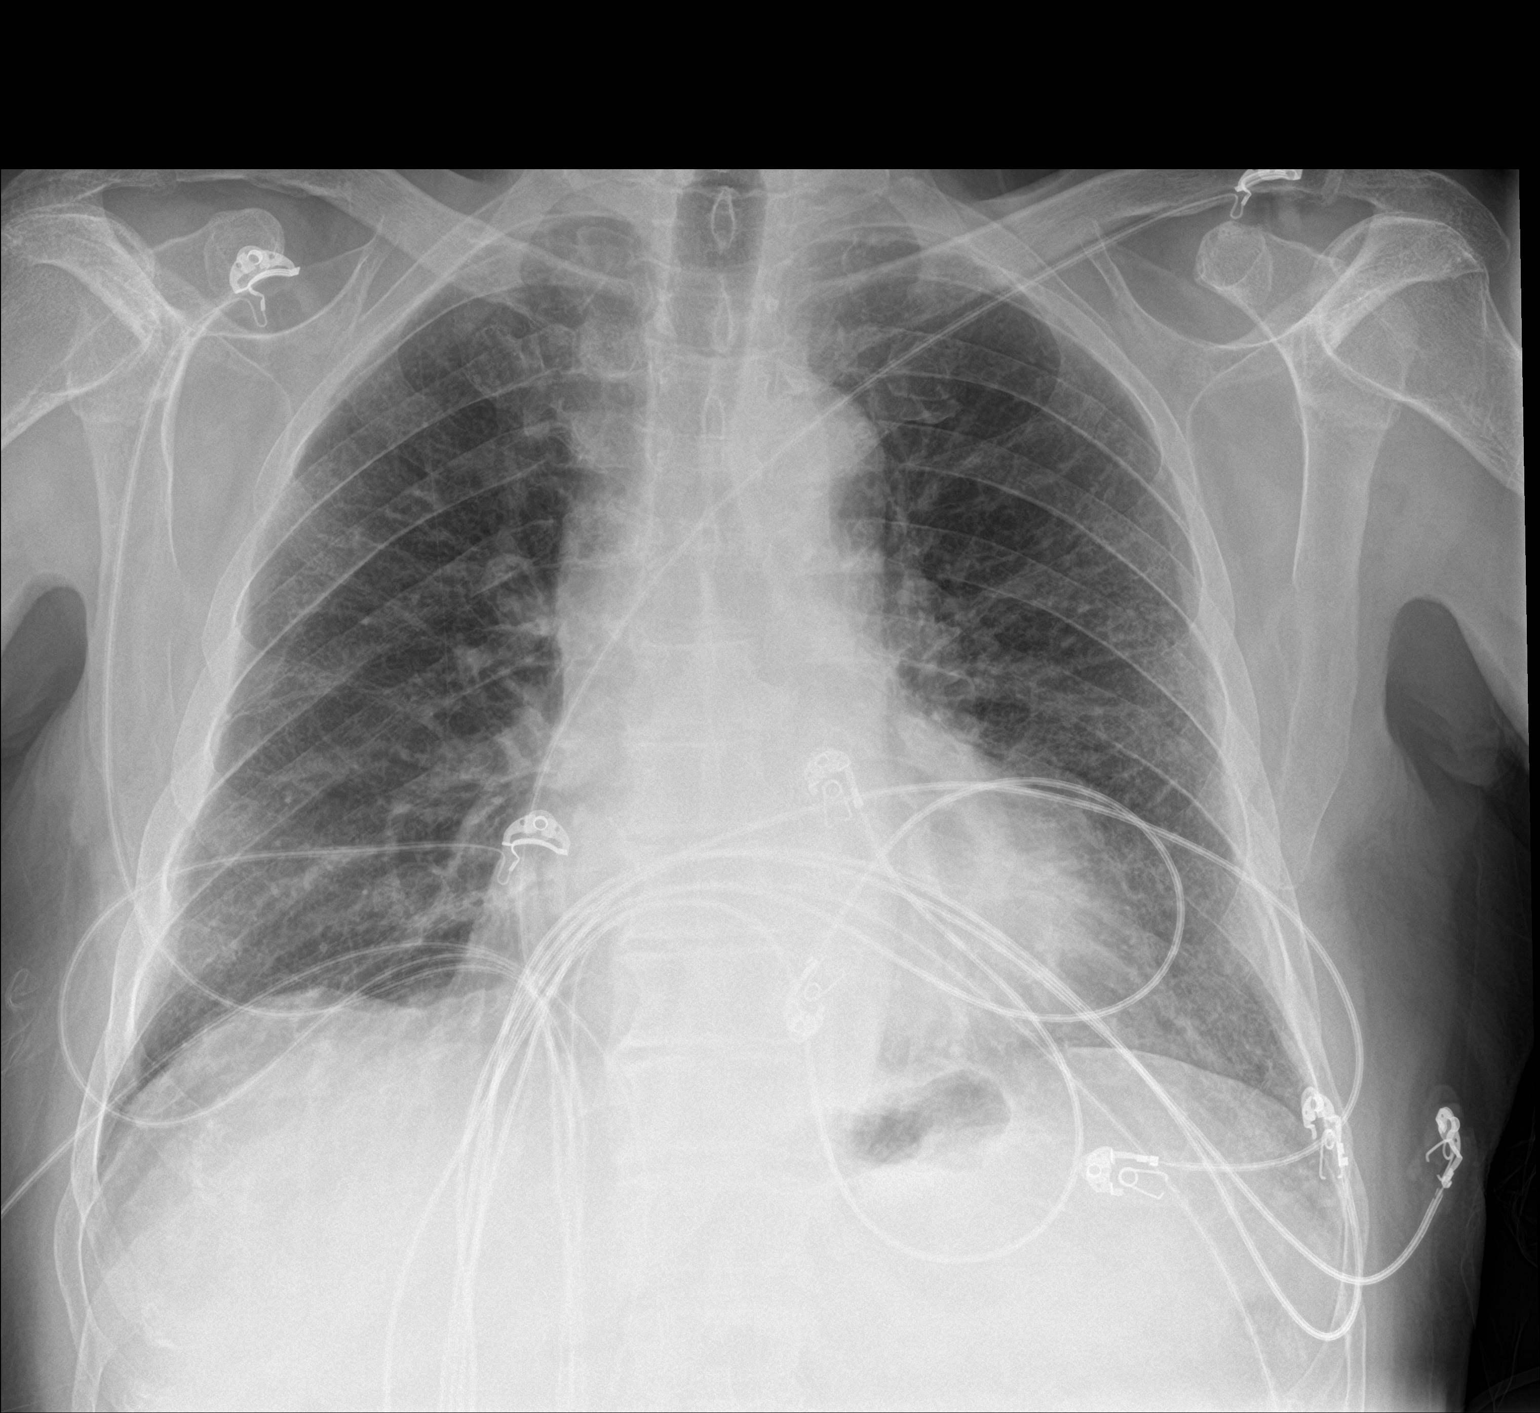

[chest lat]
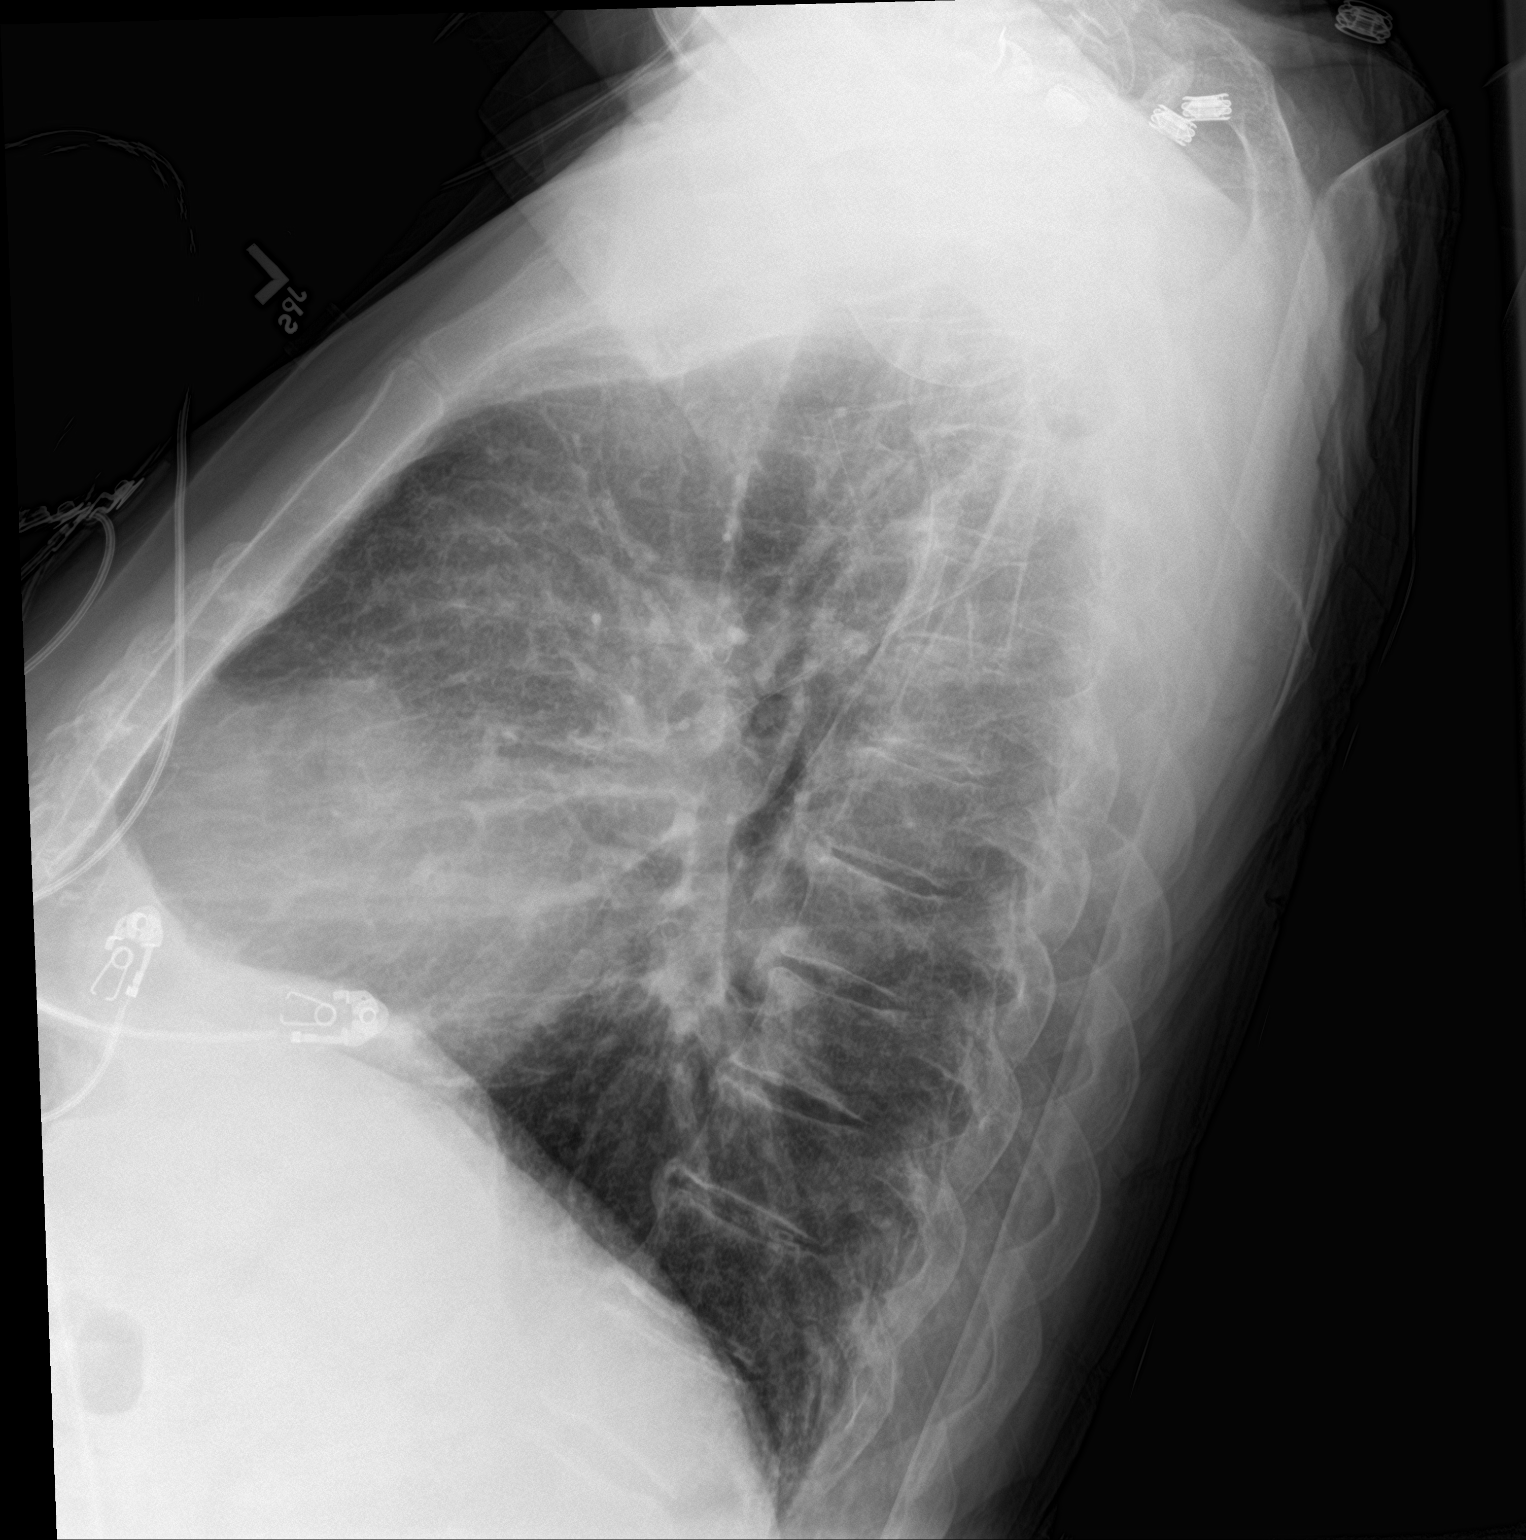

[2 of 2 positions shown; findings below may reference images not displayed]

FINDINGS: The cardiomediastinal silhouette is within normal limits. The lungs
are normally to mildly hyperinflated. The interstitial markings are
coarsened with increased markings noted peripherally in both lungs.
No airspace consolidation, overt pulmonary edema, pleural effusion,
or pneumothorax is identified. No acute osseous abnormality is seen.
IMPRESSION: 1. Increased interstitial markings bilaterally suspicious for
underlying chronic interstitial lung disease.
2. No consolidation or overt edema.

## 2019-07-22 DIAGNOSIS — I2510 Atherosclerotic heart disease of native coronary artery without angina pectoris: Secondary | ICD-10-CM | POA: Diagnosis not present

## 2019-07-22 DIAGNOSIS — I10 Essential (primary) hypertension: Secondary | ICD-10-CM | POA: Diagnosis not present

## 2019-07-22 DIAGNOSIS — E785 Hyperlipidemia, unspecified: Secondary | ICD-10-CM | POA: Diagnosis not present

## 2019-07-22 DIAGNOSIS — R7301 Impaired fasting glucose: Secondary | ICD-10-CM | POA: Diagnosis not present

## 2019-08-02 ENCOUNTER — Encounter (INDEPENDENT_AMBULATORY_CARE_PROVIDER_SITE_OTHER): Payer: Medicare Other | Admitting: Ophthalmology

## 2019-08-02 ENCOUNTER — Other Ambulatory Visit: Payer: Self-pay

## 2019-08-02 DIAGNOSIS — I10 Essential (primary) hypertension: Secondary | ICD-10-CM

## 2019-08-02 DIAGNOSIS — H353112 Nonexudative age-related macular degeneration, right eye, intermediate dry stage: Secondary | ICD-10-CM

## 2019-08-02 DIAGNOSIS — H35033 Hypertensive retinopathy, bilateral: Secondary | ICD-10-CM

## 2019-08-02 DIAGNOSIS — H353221 Exudative age-related macular degeneration, left eye, with active choroidal neovascularization: Secondary | ICD-10-CM

## 2019-08-02 DIAGNOSIS — H33301 Unspecified retinal break, right eye: Secondary | ICD-10-CM

## 2019-08-02 DIAGNOSIS — H43813 Vitreous degeneration, bilateral: Secondary | ICD-10-CM

## 2019-08-30 ENCOUNTER — Encounter (INDEPENDENT_AMBULATORY_CARE_PROVIDER_SITE_OTHER): Payer: Medicare Other | Admitting: Ophthalmology

## 2019-08-30 DIAGNOSIS — H33301 Unspecified retinal break, right eye: Secondary | ICD-10-CM

## 2019-08-30 DIAGNOSIS — H43813 Vitreous degeneration, bilateral: Secondary | ICD-10-CM

## 2019-08-30 DIAGNOSIS — H353221 Exudative age-related macular degeneration, left eye, with active choroidal neovascularization: Secondary | ICD-10-CM | POA: Diagnosis not present

## 2019-08-30 DIAGNOSIS — H35033 Hypertensive retinopathy, bilateral: Secondary | ICD-10-CM | POA: Diagnosis not present

## 2019-08-30 DIAGNOSIS — I10 Essential (primary) hypertension: Secondary | ICD-10-CM | POA: Diagnosis not present

## 2019-08-30 DIAGNOSIS — H353112 Nonexudative age-related macular degeneration, right eye, intermediate dry stage: Secondary | ICD-10-CM | POA: Diagnosis not present

## 2019-09-27 ENCOUNTER — Other Ambulatory Visit: Payer: Self-pay

## 2019-09-27 ENCOUNTER — Encounter (INDEPENDENT_AMBULATORY_CARE_PROVIDER_SITE_OTHER): Payer: Medicare Other | Admitting: Ophthalmology

## 2019-09-27 DIAGNOSIS — I10 Essential (primary) hypertension: Secondary | ICD-10-CM | POA: Diagnosis not present

## 2019-09-27 DIAGNOSIS — H353112 Nonexudative age-related macular degeneration, right eye, intermediate dry stage: Secondary | ICD-10-CM

## 2019-09-27 DIAGNOSIS — H353221 Exudative age-related macular degeneration, left eye, with active choroidal neovascularization: Secondary | ICD-10-CM | POA: Diagnosis not present

## 2019-09-27 DIAGNOSIS — H35033 Hypertensive retinopathy, bilateral: Secondary | ICD-10-CM

## 2019-09-27 DIAGNOSIS — H43813 Vitreous degeneration, bilateral: Secondary | ICD-10-CM

## 2019-10-25 ENCOUNTER — Other Ambulatory Visit: Payer: Self-pay

## 2019-10-25 ENCOUNTER — Encounter (INDEPENDENT_AMBULATORY_CARE_PROVIDER_SITE_OTHER): Payer: Medicare Other | Admitting: Ophthalmology

## 2019-10-25 DIAGNOSIS — H353221 Exudative age-related macular degeneration, left eye, with active choroidal neovascularization: Secondary | ICD-10-CM

## 2019-10-25 DIAGNOSIS — H35033 Hypertensive retinopathy, bilateral: Secondary | ICD-10-CM | POA: Diagnosis not present

## 2019-10-25 DIAGNOSIS — H353112 Nonexudative age-related macular degeneration, right eye, intermediate dry stage: Secondary | ICD-10-CM

## 2019-10-25 DIAGNOSIS — I10 Essential (primary) hypertension: Secondary | ICD-10-CM | POA: Diagnosis not present

## 2019-10-25 DIAGNOSIS — H43813 Vitreous degeneration, bilateral: Secondary | ICD-10-CM

## 2019-11-11 DIAGNOSIS — H401111 Primary open-angle glaucoma, right eye, mild stage: Secondary | ICD-10-CM | POA: Diagnosis not present

## 2019-11-11 DIAGNOSIS — Z961 Presence of intraocular lens: Secondary | ICD-10-CM | POA: Diagnosis not present

## 2019-11-11 DIAGNOSIS — H401122 Primary open-angle glaucoma, left eye, moderate stage: Secondary | ICD-10-CM | POA: Diagnosis not present

## 2019-11-11 DIAGNOSIS — H353111 Nonexudative age-related macular degeneration, right eye, early dry stage: Secondary | ICD-10-CM | POA: Diagnosis not present

## 2019-11-22 ENCOUNTER — Encounter (INDEPENDENT_AMBULATORY_CARE_PROVIDER_SITE_OTHER): Payer: Medicare Other | Admitting: Ophthalmology

## 2019-11-24 ENCOUNTER — Encounter (INDEPENDENT_AMBULATORY_CARE_PROVIDER_SITE_OTHER): Payer: Medicare Other | Admitting: Ophthalmology

## 2019-11-24 ENCOUNTER — Other Ambulatory Visit: Payer: Self-pay

## 2019-11-24 DIAGNOSIS — I10 Essential (primary) hypertension: Secondary | ICD-10-CM

## 2019-11-24 DIAGNOSIS — H353221 Exudative age-related macular degeneration, left eye, with active choroidal neovascularization: Secondary | ICD-10-CM | POA: Diagnosis not present

## 2019-11-24 DIAGNOSIS — H353112 Nonexudative age-related macular degeneration, right eye, intermediate dry stage: Secondary | ICD-10-CM

## 2019-11-24 DIAGNOSIS — H35033 Hypertensive retinopathy, bilateral: Secondary | ICD-10-CM | POA: Diagnosis not present

## 2019-11-24 DIAGNOSIS — H43813 Vitreous degeneration, bilateral: Secondary | ICD-10-CM

## 2019-11-24 DIAGNOSIS — H33301 Unspecified retinal break, right eye: Secondary | ICD-10-CM

## 2019-11-30 ENCOUNTER — Encounter: Payer: Self-pay | Admitting: Dermatology

## 2019-11-30 ENCOUNTER — Other Ambulatory Visit: Payer: Self-pay

## 2019-11-30 ENCOUNTER — Ambulatory Visit: Payer: Medicare Other | Admitting: Dermatology

## 2019-11-30 DIAGNOSIS — C44311 Basal cell carcinoma of skin of nose: Secondary | ICD-10-CM | POA: Diagnosis not present

## 2019-11-30 DIAGNOSIS — C4491 Basal cell carcinoma of skin, unspecified: Secondary | ICD-10-CM

## 2019-11-30 DIAGNOSIS — Z85828 Personal history of other malignant neoplasm of skin: Secondary | ICD-10-CM

## 2019-11-30 DIAGNOSIS — C4492 Squamous cell carcinoma of skin, unspecified: Secondary | ICD-10-CM

## 2019-11-30 DIAGNOSIS — D225 Melanocytic nevi of trunk: Secondary | ICD-10-CM | POA: Diagnosis not present

## 2019-11-30 DIAGNOSIS — D485 Neoplasm of uncertain behavior of skin: Secondary | ICD-10-CM

## 2019-11-30 DIAGNOSIS — D044 Carcinoma in situ of skin of scalp and neck: Secondary | ICD-10-CM | POA: Diagnosis not present

## 2019-11-30 DIAGNOSIS — D229 Melanocytic nevi, unspecified: Secondary | ICD-10-CM

## 2019-11-30 HISTORY — DX: Squamous cell carcinoma of skin, unspecified: C44.92

## 2019-11-30 HISTORY — DX: Basal cell carcinoma of skin, unspecified: C44.91

## 2019-11-30 NOTE — Patient Instructions (Signed)

## 2019-12-02 ENCOUNTER — Telehealth: Payer: Self-pay

## 2019-12-02 NOTE — Telephone Encounter (Signed)
-----   Message from [name], MD sent at 12/01/2019  6:18 PM EDT ----- Schedule surgery with Dr. Darene Lamer

## 2019-12-02 NOTE — Telephone Encounter (Signed)
Phone call to patient with his pathology results. Patient aware of results.  

## 2019-12-15 DIAGNOSIS — H401111 Primary open-angle glaucoma, right eye, mild stage: Secondary | ICD-10-CM | POA: Diagnosis not present

## 2019-12-15 DIAGNOSIS — H401122 Primary open-angle glaucoma, left eye, moderate stage: Secondary | ICD-10-CM | POA: Diagnosis not present

## 2019-12-27 ENCOUNTER — Encounter (INDEPENDENT_AMBULATORY_CARE_PROVIDER_SITE_OTHER): Payer: Medicare Other | Admitting: Ophthalmology

## 2019-12-27 ENCOUNTER — Other Ambulatory Visit: Payer: Self-pay

## 2019-12-27 DIAGNOSIS — H35033 Hypertensive retinopathy, bilateral: Secondary | ICD-10-CM

## 2019-12-27 DIAGNOSIS — H353112 Nonexudative age-related macular degeneration, right eye, intermediate dry stage: Secondary | ICD-10-CM

## 2019-12-27 DIAGNOSIS — I10 Essential (primary) hypertension: Secondary | ICD-10-CM

## 2019-12-27 DIAGNOSIS — H353221 Exudative age-related macular degeneration, left eye, with active choroidal neovascularization: Secondary | ICD-10-CM

## 2019-12-27 DIAGNOSIS — H33301 Unspecified retinal break, right eye: Secondary | ICD-10-CM

## 2019-12-27 DIAGNOSIS — H43813 Vitreous degeneration, bilateral: Secondary | ICD-10-CM

## 2020-01-02 ENCOUNTER — Encounter: Payer: Self-pay | Admitting: Dermatology

## 2020-01-02 NOTE — Progress Notes (Signed)
Follow-Up Visit   Subjective  Kenneth Pope is a 86 y.o. male who presents for the following: Skin Problem (Patient here today for recheck on nose, no bleeding per patient.).  New growths Location: Scalp and nose Duration:  Quality:  Associated Signs/Symptoms: Modifying Factors:  Severity:  Timing: Context: History of skin cancers  Objective  Well appearing patient in no apparent distress; mood and affect are within normal limits.  All skin waist up examined.   Assessment & Plan  Skin cancer screening performed today.  Patient Instructions  Biopsy, Surgery (Curettage) & Surgery (Excision) Aftercare Instructions  1. Okay to remove bandage in 24 hours  2. Wash area with soap and water  3. Apply Vaseline to area twice daily until healed (Not Neosporin)  4. Okay to cover with a Band-Aid to decrease the chance of infection or prevent irritation from clothing; also it's okay to uncover lesion at home.  5. Suture instructions: return to our office in 7-10 or 10-14 days for a nurse visit for suture removal. Variable healing with sutures, if pain or itching occurs call our office. It's okay to shower or bathe 24 hours after sutures are given.  6. The following risks may occur after a biopsy, curettage or excision: bleeding, scarring, discoloration, recurrence, infection (redness, yellow drainage, pain or swelling).  7. For questions, concerns and results call our office at [place] before 4pm & Friday before 3pm. Biopsy results will be available in 1 week.     Neoplasm of uncertain behavior of skin (3) Dorsum of Nose  Skin / nail biopsy Type of biopsy: tangential   Informed consent: discussed and consent obtained   Timeout: patient name, date of birth, surgical site, and procedure verified   Procedure prep:  Patient was prepped and draped in usual sterile fashion Prep type:  Chlorhexidine Anesthesia: the lesion was anesthetized in a standard fashion     Anesthetic:  1% lidocaine w/ epinephrine 1-100,000 local infiltration Instrument used: flexible razor blade   Hemostasis achieved with: ferric subsulfate   Outcome: patient tolerated procedure well   Post-procedure details: wound care instructions given    Specimen 1 - Surgical pathology Differential Diagnosis: R/O BCC vs SCC Check Margins: No Previous Bx: [id number]  Left Parietal Scalp Lateral  Skin / nail biopsy Type of biopsy: tangential   Informed consent: discussed and consent obtained   Timeout: patient name, date of birth, surgical site, and procedure verified   Procedure prep:  Patient was prepped and draped in usual sterile fashion Prep type:  Chlorhexidine Anesthesia: the lesion was anesthetized in a standard fashion   Anesthesia comment:  Area prepped with alcohol Anesthetic:  1% lidocaine w/ epinephrine 1-100,000 buffered w/ 8.4% NaHCO3 Instrument used: flexible razor blade   Hemostasis achieved with: pressure, aluminum chloride, ferric subsulfate and electrodesiccation   Outcome: patient tolerated procedure well   Post-procedure details: wound care instructions given   Post-procedure details comment:  Ointment and small bandage applied  Specimen 2 - Surgical pathology Differential Diagnosis: R/O BCC vs SCC Check Margins: No  Left Parietal Scalp Medial  Skin / nail biopsy Type of biopsy: tangential   Informed consent: discussed and consent obtained   Timeout: patient name, date of birth, surgical site, and procedure verified   Procedure prep:  Patient was prepped and draped in usual sterile fashion Prep type:  Chlorhexidine Anesthesia: the lesion was anesthetized in a standard fashion   Anesthetic:  1% lidocaine w/ epinephrine 1-100,000 local infiltration Instrument used: flexible razor  blade   Hemostasis achieved with: ferric subsulfate   Outcome: patient tolerated procedure well   Post-procedure details: wound care instructions given    Specimen 3 -  Surgical pathology Differential Diagnosis: R/O BCC vs SCC Check Margins: No  Nevus Mid Back  Annual skin examination  Personal history of skin cancer Left Forehead  Annual skin examination      I, [name], MD, have reviewed all documentation for this visit.  The documentation on 01/02/20 for the exam, diagnosis, procedures, and orders are all accurate and complete.

## 2020-01-03 DIAGNOSIS — H401122 Primary open-angle glaucoma, left eye, moderate stage: Secondary | ICD-10-CM | POA: Diagnosis not present

## 2020-01-13 ENCOUNTER — Ambulatory Visit (INDEPENDENT_AMBULATORY_CARE_PROVIDER_SITE_OTHER): Payer: Medicare Other | Admitting: Dermatology

## 2020-01-13 ENCOUNTER — Other Ambulatory Visit: Payer: Self-pay

## 2020-01-13 ENCOUNTER — Encounter: Payer: Self-pay | Admitting: Dermatology

## 2020-01-13 DIAGNOSIS — C44311 Basal cell carcinoma of skin of nose: Secondary | ICD-10-CM

## 2020-01-13 DIAGNOSIS — D044 Carcinoma in situ of skin of scalp and neck: Secondary | ICD-10-CM

## 2020-01-13 DIAGNOSIS — D099 Carcinoma in situ, unspecified: Secondary | ICD-10-CM

## 2020-01-16 DIAGNOSIS — M7989 Other specified soft tissue disorders: Secondary | ICD-10-CM | POA: Insufficient documentation

## 2020-01-16 DIAGNOSIS — I2510 Atherosclerotic heart disease of native coronary artery without angina pectoris: Secondary | ICD-10-CM | POA: Insufficient documentation

## 2020-01-16 NOTE — Progress Notes (Signed)
Cardiology Office Note   Date:  01/17/2020   ID:  Kenneth, Pope 1934/04/15, MRN [id number]  PCP:  Reynold Bowen, MD  Cardiologist:   Minus Breeding, MD   Chief Complaint  Patient presents with  . Atrial Fibrillation      History of Present Illness: Kenneth Pope is a 86 y.o. male who presents for follow-up of coronary disease.  He was admitted late last year with syncope.  He had an elevated troponin.  He eventually had a cardiac catheterization demonstrating 50% stenosis in the LAD.  He had 85% stenosis in the right coronary artery.  He had DES to the RCA.  He was noted also to have probable coronary spasm in the LAD.  His syncope was thought to be related possibly to an arrhythmia and he was started on a beta-blocker.  ACE inhibitor was stopped.  He did have an event monitor and no arrhythmia was noted.    Since he was last seen he has done well.  He has had no further syncope.  He never really did have chest discomfort. The patient denies any new symptoms such as chest discomfort, neck or arm discomfort. There has been no new shortness of breath, PND or orthopnea. There have been no reported palpitations, presyncope or syncope.    Past Medical History:  Diagnosis Date  . Arthritis   . BCC (basal cell carcinoma of skin) 12/08/2013   Bridge of Nose and Right Ear  . CAD (coronary artery disease) 05/04/2018   a. 05/04/18 NSTEMI, 05/05/18 DES to dominant RCA  . Cancer ([place])    skin lesion  . Former tobacco use   . Hyperlipidemia   . Hypertension   . Macular degeneration   . 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 Nodular basal cell carcinoma (BCC) 09/08/2017   Above Left Ear and Tip Left Nose  . SCC (squamous cell carcinoma) 05/23/2016   Left Cheek - Well Diff  . SCCA (squamous cell carcinoma) of skin 11/30/2019   Left Parietal Scalp Lateral (in situ)  . SCCA (squamous cell carcinoma) of skin 11/30/2019   Left Parietal Scalp  Medial (in situ)  . 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 Superficial basal cell carcinoma (BCC) 11/30/2019   Dorsum of Nose    Past Surgical History:  Procedure Laterality Date  . CARDIAC CATHETERIZATION    . CORONARY STENT INTERVENTION N/A 05/05/2018   Procedure: CORONARY STENT INTERVENTION;  Surgeon: Troy Sine, MD;  Location: [facility];  Service: Cardiovascular;  Laterality: N/A;  . HERNIA REPAIR    . LEFT HEART CATH AND CORONARY ANGIOGRAPHY N/A 05/05/2018   Procedure: LEFT HEART CATH AND CORONARY ANGIOGRAPHY;  Surgeon: Troy Sine, MD;  Location: [facility];  Service: Cardiovascular;  Laterality: N/A;     Current Outpatient Medications  Medication Sig Dispense Refill  . amLODipine (NORVASC) 10 MG tablet TAKE 1 TABLET BY MOUTH EVERY DAY 90 tablet 2  . aspirin EC 81 MG tablet Take 81 mg by mouth daily.    Marland Kitchen atorvastatin (LIPITOR) 80 MG tablet Take 1 tablet (80 mg total) by mouth daily at 6 PM. 90 tablet 3  . carvedilol (COREG) 3.125 MG tablet Take 1 tablet (3.125 mg total) by mouth 2 (two) times daily with a meal. 180 tablet 2  . dorzolamide-timolol (COSOPT) 22.3-6.8 MG/ML ophthalmic solution 1 drop 2 (two) times daily.    Marland Kitchen  Multiple Vitamin (MULTIVITAMIN WITH MINERALS) TABS tablet Take 1 tablet by mouth daily.    . Multiple Vitamins-Minerals (PRESERVISION AREDS 2) CAPS Take 1 capsule by mouth 2 (two) times daily.     . nitroGLYCERIN (NITROSTAT) 0.4 MG SL tablet Place 1 tablet (0.4 mg total) under the tongue every 5 (five) minutes as needed for chest pain. 25 tablet 12  . omega-3 acid ethyl esters (LOVAZA) 1 g capsule Take 1 g by mouth 2 (two) times daily.     No current facility-administered medications for this visit.    Allergies:   Patient has no known allergies.     ROS:  Please see the history of present illness.   Otherwise, review of systems are positive for none.   All other systems are reviewed and negative.     PHYSICAL EXAM: VS:  BP 102/80 (BP Location: Left Arm, Patient Position: Sitting, Cuff Size: Normal)   Pulse (!) 51   Ht 5\' 8"  (1.727 m)   Wt 169 lb 9.6 oz (76.9 kg)   BMI 25.79 kg/m  , BMI Body mass index is 25.79 kg/m. GENERAL:  Well appearing NECK:  No jugular venous distention, waveform within normal limits, carotid upstroke brisk and symmetric, no bruits, no thyromegaly LUNGS:  Clear to auscultation bilaterally CHEST:  Unremarkable HEART:  PMI not displaced or sustained,S1 and S2 within normal limits, no S3, no S4, no clicks, no rubs, no murmurs ABD:  Flat, positive bowel sounds normal in frequency in pitch, no bruits, no rebound, no guarding, no midline pulsatile mass, no hepatomegaly, no splenomegaly EXT:  2 plus pulses throughout, no edema, no cyanosis no clubbing  EKG:  EKG is  ordered today. Sinus bradycardia, rate 51, axis within normal limits, intervals within normal limits, no acute ST-T wave changes.  Recent Labs: No results found for requested labs within last 8760 hours.    Lipid Panel    Component Value Date/Time   CHOL 135 05/05/2018 0219   TRIG 51 05/05/2018 0219   HDL 56 05/05/2018 0219   CHOLHDL 2.4 05/05/2018 0219   VLDL 10 05/05/2018 0219   LDLCALC 69 05/05/2018 0219      Wt Readings from Last 3 Encounters:  01/17/20 169 lb 9.6 oz (76.9 kg)  01/01/19 171 lb 9.6 oz (77.8 kg)  08/26/18 171 lb 8.3 oz (77.8 kg)      Other studies Reviewed: Additional studies/ records that were reviewed today include: Labs. Review of the above records demonstrates:  Please see elsewhere in the note.     ASSESSMENT AND PLAN:  CAD:   The patient has no new sypmtoms.  No further cardiovascular testing is indicated.  We will continue with aggressive risk reduction and meds as listed.  HTN:   Blood pressure is at target.  No change in therapy.   DYSLIPIDEMIA:   LDL was 62.  He will continue on the meds as listed.  EDEMA:    He has some mild leg edema that does  not bother him and probably is related to his amlodipine.  No change in therapy.  COVID : He has been vaccinated.     Current medicines are reviewed at length with the patient today.  The patient does not have concerns regarding medicines.  The following changes have been made:  None  Labs/ tests ordered today include: None  Orders Placed This Encounter  Procedures  . EKG 12-Lead     Disposition:   FU with me in one year.  Signed, Minus Breeding, MD  01/17/2020 12:03 PM    [facility]

## 2020-01-17 ENCOUNTER — Encounter: Payer: Self-pay | Admitting: Cardiology

## 2020-01-17 ENCOUNTER — Ambulatory Visit: Payer: Medicare Other | Admitting: Cardiology

## 2020-01-17 ENCOUNTER — Other Ambulatory Visit: Payer: Self-pay

## 2020-01-17 VITALS — BP 102/80 | HR 51 | Ht 68.0 in | Wt 169.6 lb

## 2020-01-17 DIAGNOSIS — M7989 Other specified soft tissue disorders: Secondary | ICD-10-CM

## 2020-01-17 DIAGNOSIS — I10 Essential (primary) hypertension: Secondary | ICD-10-CM | POA: Diagnosis not present

## 2020-01-17 DIAGNOSIS — E785 Hyperlipidemia, unspecified: Secondary | ICD-10-CM | POA: Diagnosis not present

## 2020-01-17 DIAGNOSIS — I2510 Atherosclerotic heart disease of native coronary artery without angina pectoris: Secondary | ICD-10-CM

## 2020-01-17 MED ORDER — ATORVASTATIN CALCIUM 80 MG PO TABS: 80.0000 mg | ORAL_TABLET | Freq: Every day | ORAL | 3 refills | Status: DC

## 2020-01-17 NOTE — Patient Instructions (Signed)
Medication Instructions:  Your physician recommends that you continue on your current medications as directed. Please refer to the Current Medication list given to you today.  *If you need a refill on your cardiac medications before your next appointment, please call your pharmacy*  Lab Work: NONE ordered at this time of appointment   If you have labs (blood work) drawn today and your tests are completely normal, you will receive your results only by:  [place] (if you have MyChart) OR  A paper copy in the mail If you have any lab test that is abnormal or we need to change your treatment, we will call you to review the results.  Testing/Procedures: NONE ordered at this time of appointment   Follow-Up: At [facility], you and your health needs are our priority.  As part of our continuing mission to provide you with exceptional heart care, we have created designated Provider Care Teams.  These Care Teams include your primary Cardiologist (physician) and Advanced Practice Providers (APPs -  Physician Assistants and Nurse Practitioners) who all work together to provide you with the care you need, when you need it.   Your next appointment:   1 year(s)  The format for your next appointment:   In Person  Provider:   You may see Minus Breeding, MD or one of the following Advanced Practice Providers on your designated Care Team:    Rosaria Ferries, PA-C  Jory Sims, DNP, ANP  Cadence Kathlen Mody, PA-C  Other Instructions

## 2020-01-18 ENCOUNTER — Other Ambulatory Visit: Payer: Self-pay

## 2020-01-18 ENCOUNTER — Telehealth: Payer: Self-pay | Admitting: Cardiology

## 2020-01-18 MED ORDER — CARVEDILOL 3.125 MG PO TABS: 3.1250 mg | ORAL_TABLET | Freq: Two times a day (BID) | ORAL | 3 refills | Status: DC

## 2020-01-18 NOTE — Telephone Encounter (Signed)
Pt c/o medication issue:  1. Name of Medication: carvedilol (COREG) 3.125 MG tablet  2. How are you currently taking this medication (dosage and times per day)? As written   3. Are you having a reaction (difficulty breathing--STAT)? No  4. What is your medication issue? Pt called needing a new prescription filled by the doctor

## 2020-01-18 NOTE — Telephone Encounter (Signed)
Refill for medication sent to pharmacy. Notified pt. No other questions at this time.

## 2020-01-24 ENCOUNTER — Other Ambulatory Visit: Payer: Self-pay

## 2020-01-24 ENCOUNTER — Encounter (INDEPENDENT_AMBULATORY_CARE_PROVIDER_SITE_OTHER): Payer: Medicare Other | Admitting: Ophthalmology

## 2020-01-24 DIAGNOSIS — H33301 Unspecified retinal break, right eye: Secondary | ICD-10-CM

## 2020-01-24 DIAGNOSIS — H353221 Exudative age-related macular degeneration, left eye, with active choroidal neovascularization: Secondary | ICD-10-CM | POA: Diagnosis not present

## 2020-01-24 DIAGNOSIS — H35033 Hypertensive retinopathy, bilateral: Secondary | ICD-10-CM

## 2020-01-24 DIAGNOSIS — I10 Essential (primary) hypertension: Secondary | ICD-10-CM | POA: Diagnosis not present

## 2020-01-24 DIAGNOSIS — H43813 Vitreous degeneration, bilateral: Secondary | ICD-10-CM

## 2020-01-24 DIAGNOSIS — H353112 Nonexudative age-related macular degeneration, right eye, intermediate dry stage: Secondary | ICD-10-CM

## 2020-01-27 ENCOUNTER — Telehealth: Payer: Self-pay | Admitting: Dermatology

## 2020-01-27 NOTE — Telephone Encounter (Signed)
FYI for ST: Healing doin' well!

## 2020-02-08 DIAGNOSIS — R7301 Impaired fasting glucose: Secondary | ICD-10-CM | POA: Diagnosis not present

## 2020-02-08 DIAGNOSIS — E785 Hyperlipidemia, unspecified: Secondary | ICD-10-CM | POA: Diagnosis not present

## 2020-02-14 DIAGNOSIS — H401122 Primary open-angle glaucoma, left eye, moderate stage: Secondary | ICD-10-CM | POA: Diagnosis not present

## 2020-02-14 DIAGNOSIS — H401111 Primary open-angle glaucoma, right eye, mild stage: Secondary | ICD-10-CM | POA: Diagnosis not present

## 2020-02-15 DIAGNOSIS — Z0000 Encounter for general adult medical examination without abnormal findings: Secondary | ICD-10-CM | POA: Diagnosis not present

## 2020-02-15 DIAGNOSIS — Z1212 Encounter for screening for malignant neoplasm of rectum: Secondary | ICD-10-CM | POA: Diagnosis not present

## 2020-02-15 DIAGNOSIS — E785 Hyperlipidemia, unspecified: Secondary | ICD-10-CM | POA: Diagnosis not present

## 2020-02-15 DIAGNOSIS — H3530 Unspecified macular degeneration: Secondary | ICD-10-CM | POA: Diagnosis not present

## 2020-02-15 DIAGNOSIS — R82998 Other abnormal findings in urine: Secondary | ICD-10-CM | POA: Diagnosis not present

## 2020-02-15 DIAGNOSIS — I10 Essential (primary) hypertension: Secondary | ICD-10-CM | POA: Diagnosis not present

## 2020-02-22 ENCOUNTER — Encounter (INDEPENDENT_AMBULATORY_CARE_PROVIDER_SITE_OTHER): Payer: Medicare Other | Admitting: Ophthalmology

## 2020-02-22 ENCOUNTER — Encounter: Payer: Self-pay | Admitting: Dermatology

## 2020-02-22 ENCOUNTER — Other Ambulatory Visit: Payer: Self-pay

## 2020-02-22 DIAGNOSIS — H353112 Nonexudative age-related macular degeneration, right eye, intermediate dry stage: Secondary | ICD-10-CM | POA: Diagnosis not present

## 2020-02-22 DIAGNOSIS — H33301 Unspecified retinal break, right eye: Secondary | ICD-10-CM

## 2020-02-22 DIAGNOSIS — H353221 Exudative age-related macular degeneration, left eye, with active choroidal neovascularization: Secondary | ICD-10-CM | POA: Diagnosis not present

## 2020-02-22 DIAGNOSIS — H43813 Vitreous degeneration, bilateral: Secondary | ICD-10-CM

## 2020-02-22 DIAGNOSIS — H35033 Hypertensive retinopathy, bilateral: Secondary | ICD-10-CM

## 2020-02-22 DIAGNOSIS — I10 Essential (primary) hypertension: Secondary | ICD-10-CM

## 2020-02-22 NOTE — Progress Notes (Signed)
   Follow-Up Visit   Subjective  Kenneth Pope is a 86 y.o. male who presents for the following: Procedure ([place] x1 CIS x2).  Skin cancers Location: Nose and scalp Duration:  Quality:  Associated Signs/Symptoms: Modifying Factors:  Severity:  Timing: Context: These are biopsy-proven; history of multiple previous skin cancers  Objective  Well appearing patient in no apparent distress; mood and affect are within normal limits.  A focused examination was performed including Head and neck.. Relevant physical exam findings are noted in the Assessment and Plan.   Assessment & Plan    Basal cell carcinoma (BCC) of skin of nose Dorsum of Nose  Destruction of lesion Complexity: simple   Destruction method: electrodesiccation and curettage   Informed consent: discussed and consent obtained   Timeout:  patient name, date of birth, surgical site, and procedure verified Anesthesia: the lesion was anesthetized in a standard fashion   Anesthetic:  1% lidocaine w/ epinephrine 1-100,000 local infiltration Curettage performed in three different directions: Yes   Curettage cycles:  3 Lesion length (cm):  1.1 Lesion width (cm):  1 Margin per side (cm):  0 Final wound size (cm):  1.1 Hemostasis achieved with:  ferric subsulfate Outcome: patient tolerated procedure well with no complications   Post-procedure details: wound care instructions given   Additional details:  Inoculated with parenteral 5% fluorouracil  Squamous cell carcinoma in situ (2) Left parietal scalp lateral  Destruction of lesion Complexity: simple   Destruction method: electrodesiccation and curettage   Informed consent: discussed and consent obtained   Timeout:  patient name, date of birth, surgical site, and procedure verified Anesthesia: the lesion was anesthetized in a standard fashion   Anesthetic:  1% lidocaine w/ epinephrine 1-100,000 local infiltration Curettage performed in three different directions: Yes    Curettage cycles:  3 Lesion length (cm):  1 Lesion width (cm):  1 Margin per side (cm):  0 Final wound size (cm):  1 Hemostasis achieved with:  ferric subsulfate Outcome: patient tolerated procedure well with no complications   Post-procedure details: wound care instructions given   Additional details:  Inoculated with parenteral 5% fluorouracil  Left parietal scalp medial  Destruction of lesion Complexity: simple   Destruction method: electrodesiccation and curettage   Informed consent: discussed and consent obtained   Timeout:  patient name, date of birth, surgical site, and procedure verified Anesthesia: the lesion was anesthetized in a standard fashion   Anesthetic:  1% lidocaine w/ epinephrine 1-100,000 local infiltration Curettage performed in three different directions: Yes   Curettage cycles:  3 Lesion length (cm):  0.9 Lesion width (cm):  0.9 Margin per side (cm):  0 Final wound size (cm):  0.9 Hemostasis achieved with:  ferric subsulfate Outcome: patient tolerated procedure well with no complications   Post-procedure details: wound care instructions given   Additional details:  Inoculated with parenteral 5% fluorouracil     I, [name], MD, have reviewed all documentation for this visit.  The documentation on 02/22/20 for the exam, diagnosis, procedures, and orders are all accurate and complete.

## 2020-03-01 DIAGNOSIS — Z0120 Encounter for dental examination and cleaning without abnormal findings: Secondary | ICD-10-CM | POA: Diagnosis not present

## 2020-03-06 ENCOUNTER — Other Ambulatory Visit: Payer: Self-pay | Admitting: Cardiology

## 2020-03-06 MED ORDER — NITROGLYCERIN 0.4 MG SL SUBL: 0.4000 mg | SUBLINGUAL_TABLET | SUBLINGUAL | 12 refills | Status: DC | PRN

## 2020-03-06 NOTE — Telephone Encounter (Signed)
*  STAT* If patient is at the pharmacy, call can be transferred to refill team.   1. Which medications need to be refilled? (please list name of each medication and dose if known)  nitroGLYCERIN (NITROSTAT) 0.4 MG SL tablet  2. Which pharmacy/location (including street and city if local pharmacy) is medication to be sent to? [place], [place] - [address] AT [place]  3. Do they need a 30 day or 90 day supply? 30 with refills  Pt said his medication is expired and does not have any refills

## 2020-03-07 ENCOUNTER — Telehealth: Payer: Self-pay | Admitting: Cardiology

## 2020-03-07 MED ORDER — NITROGLYCERIN 0.4 MG SL SUBL: 0.4000 mg | SUBLINGUAL_TABLET | SUBLINGUAL | 11 refills | Status: AC | PRN

## 2020-03-07 NOTE — Telephone Encounter (Signed)
Follow Up:     The pharmacist said they had not received his refill from yesterday.

## 2020-03-07 NOTE — Telephone Encounter (Signed)
Patient stated pharmacy never received his prescription. Recent to patient's pharmacy of choice. Patient will call back if he has any more concerns or questions.

## 2020-03-20 ENCOUNTER — Other Ambulatory Visit: Payer: Self-pay

## 2020-03-20 ENCOUNTER — Encounter (INDEPENDENT_AMBULATORY_CARE_PROVIDER_SITE_OTHER): Payer: Medicare Other | Admitting: Ophthalmology

## 2020-03-20 DIAGNOSIS — H353112 Nonexudative age-related macular degeneration, right eye, intermediate dry stage: Secondary | ICD-10-CM | POA: Diagnosis not present

## 2020-03-20 DIAGNOSIS — H353221 Exudative age-related macular degeneration, left eye, with active choroidal neovascularization: Secondary | ICD-10-CM

## 2020-03-20 DIAGNOSIS — H35033 Hypertensive retinopathy, bilateral: Secondary | ICD-10-CM

## 2020-03-20 DIAGNOSIS — H43813 Vitreous degeneration, bilateral: Secondary | ICD-10-CM

## 2020-03-20 DIAGNOSIS — I10 Essential (primary) hypertension: Secondary | ICD-10-CM

## 2020-03-20 DIAGNOSIS — H33301 Unspecified retinal break, right eye: Secondary | ICD-10-CM

## 2020-04-01 DIAGNOSIS — Z23 Encounter for immunization: Secondary | ICD-10-CM | POA: Diagnosis not present

## 2020-04-17 ENCOUNTER — Encounter (INDEPENDENT_AMBULATORY_CARE_PROVIDER_SITE_OTHER): Payer: Medicare Other | Admitting: Ophthalmology

## 2020-04-17 ENCOUNTER — Other Ambulatory Visit: Payer: Self-pay

## 2020-04-17 DIAGNOSIS — H353221 Exudative age-related macular degeneration, left eye, with active choroidal neovascularization: Secondary | ICD-10-CM

## 2020-04-17 DIAGNOSIS — H353112 Nonexudative age-related macular degeneration, right eye, intermediate dry stage: Secondary | ICD-10-CM

## 2020-04-17 DIAGNOSIS — I10 Essential (primary) hypertension: Secondary | ICD-10-CM

## 2020-04-17 DIAGNOSIS — H35033 Hypertensive retinopathy, bilateral: Secondary | ICD-10-CM | POA: Diagnosis not present

## 2020-04-17 DIAGNOSIS — H43813 Vitreous degeneration, bilateral: Secondary | ICD-10-CM

## 2020-05-15 ENCOUNTER — Encounter (INDEPENDENT_AMBULATORY_CARE_PROVIDER_SITE_OTHER): Payer: Medicare Other | Admitting: Ophthalmology

## 2020-05-15 ENCOUNTER — Other Ambulatory Visit: Payer: Self-pay

## 2020-05-15 DIAGNOSIS — H35033 Hypertensive retinopathy, bilateral: Secondary | ICD-10-CM | POA: Diagnosis not present

## 2020-05-15 DIAGNOSIS — H353112 Nonexudative age-related macular degeneration, right eye, intermediate dry stage: Secondary | ICD-10-CM

## 2020-05-15 DIAGNOSIS — H43813 Vitreous degeneration, bilateral: Secondary | ICD-10-CM

## 2020-05-15 DIAGNOSIS — I10 Essential (primary) hypertension: Secondary | ICD-10-CM

## 2020-05-15 DIAGNOSIS — H33301 Unspecified retinal break, right eye: Secondary | ICD-10-CM

## 2020-05-15 DIAGNOSIS — H353221 Exudative age-related macular degeneration, left eye, with active choroidal neovascularization: Secondary | ICD-10-CM

## 2020-05-30 ENCOUNTER — Ambulatory Visit (INDEPENDENT_AMBULATORY_CARE_PROVIDER_SITE_OTHER): Payer: Medicare Other | Admitting: Dermatology

## 2020-05-30 ENCOUNTER — Encounter: Payer: Self-pay | Admitting: Dermatology

## 2020-05-30 ENCOUNTER — Other Ambulatory Visit: Payer: Self-pay

## 2020-05-30 DIAGNOSIS — L570 Actinic keratosis: Secondary | ICD-10-CM | POA: Diagnosis not present

## 2020-05-30 DIAGNOSIS — C44319 Basal cell carcinoma of skin of other parts of face: Secondary | ICD-10-CM

## 2020-05-30 DIAGNOSIS — Z85828 Personal history of other malignant neoplasm of skin: Secondary | ICD-10-CM | POA: Diagnosis not present

## 2020-05-30 DIAGNOSIS — D485 Neoplasm of uncertain behavior of skin: Secondary | ICD-10-CM

## 2020-05-30 NOTE — Patient Instructions (Signed)

## 2020-06-02 ENCOUNTER — Telehealth: Payer: Self-pay | Admitting: *Deleted

## 2020-06-02 NOTE — Telephone Encounter (Signed)
Path to patient made surgery appointment with Dr.Tafeen.

## 2020-06-03 ENCOUNTER — Encounter: Payer: Self-pay | Admitting: Dermatology

## 2020-06-03 NOTE — Progress Notes (Signed)
   Follow-Up Visit   Subjective  Kenneth Pope is a 86 y.o. male who presents for the following: Skin Problem (Left cheek & scalp- scaly spots).  New crust Location: Left cheek Duration:  Quality:  Associated Signs/Symptoms: Modifying Factors:  Severity:  Timing: Context: History of multiple nonmelanoma skin cancers  Objective  Well appearing patient in no apparent distress; mood and affect are within normal limits. Objective  Left Zygomatic Area: Pearly focally eroded 6 mm pink papule     Objective  Glabella, Left Nasal Sidewall, Left Preauricular Area, Left Temple, Mid Forehead, Mid Parietal Scalp, Right Frontal Scalp, Right Parietal Scalp, Right Temple: Multiple 2 to 4 mm pink hornlike crusts and gentleman with history of multiple skin cancers.   All skin waist up examined.   Assessment & Plan    Neoplasm of uncertain behavior of skin Left Zygomatic Area  Skin / nail biopsy Type of biopsy: tangential   Informed consent: discussed and consent obtained   Timeout: patient name, date of birth, surgical site, and procedure verified   Procedure prep:  Patient was prepped and draped in usual sterile fashion (Non sterile) Prep type:  Chlorhexidine Anesthesia: the lesion was anesthetized in a standard fashion   Anesthetic:  1% lidocaine w/ epinephrine 1-100,000 local infiltration Instrument used: flexible razor blade   Outcome: patient tolerated procedure well   Post-procedure details: wound care instructions given    Specimen 1 - Surgical pathology Differential Diagnosis: scc vs bcc  Check Margins: No  AK (actinic keratosis) (9) Right Frontal Scalp; Right Parietal Scalp; Mid Parietal Scalp; Mid Forehead; Glabella; Left Temple; Right Temple; Left Preauricular Area; Left Nasal Sidewall  Destruction of lesion - Glabella, Left Nasal Sidewall, Left Preauricular Area, Left Temple, Mid Forehead, Mid Parietal Scalp, Right Frontal Scalp, Right Parietal Scalp, Right  Temple Complexity: simple   Destruction method: cryotherapy   Informed consent: discussed and consent obtained   Timeout:  patient name, date of birth, surgical site, and procedure verified Lesion destroyed using liquid nitrogen: Yes   Cryotherapy cycles:  5 Outcome: patient tolerated procedure well with no complications       I, [name], MD, have reviewed all documentation for this visit.  The documentation on 06/03/20 for the exam, diagnosis, procedures, and orders are all accurate and complete.

## 2020-06-08 ENCOUNTER — Other Ambulatory Visit: Payer: Self-pay

## 2020-06-08 ENCOUNTER — Encounter (INDEPENDENT_AMBULATORY_CARE_PROVIDER_SITE_OTHER): Payer: Medicare Other | Admitting: Ophthalmology

## 2020-06-08 DIAGNOSIS — H353112 Nonexudative age-related macular degeneration, right eye, intermediate dry stage: Secondary | ICD-10-CM

## 2020-06-08 DIAGNOSIS — H353221 Exudative age-related macular degeneration, left eye, with active choroidal neovascularization: Secondary | ICD-10-CM | POA: Diagnosis not present

## 2020-06-08 DIAGNOSIS — H43813 Vitreous degeneration, bilateral: Secondary | ICD-10-CM

## 2020-06-08 DIAGNOSIS — H35033 Hypertensive retinopathy, bilateral: Secondary | ICD-10-CM | POA: Diagnosis not present

## 2020-06-08 DIAGNOSIS — I10 Essential (primary) hypertension: Secondary | ICD-10-CM

## 2020-07-06 ENCOUNTER — Encounter (INDEPENDENT_AMBULATORY_CARE_PROVIDER_SITE_OTHER): Payer: Medicare Other | Admitting: Ophthalmology

## 2020-07-06 ENCOUNTER — Other Ambulatory Visit: Payer: Self-pay

## 2020-07-06 DIAGNOSIS — H353221 Exudative age-related macular degeneration, left eye, with active choroidal neovascularization: Secondary | ICD-10-CM

## 2020-07-06 DIAGNOSIS — H33301 Unspecified retinal break, right eye: Secondary | ICD-10-CM

## 2020-07-06 DIAGNOSIS — I10 Essential (primary) hypertension: Secondary | ICD-10-CM

## 2020-07-06 DIAGNOSIS — H43813 Vitreous degeneration, bilateral: Secondary | ICD-10-CM

## 2020-07-06 DIAGNOSIS — H353112 Nonexudative age-related macular degeneration, right eye, intermediate dry stage: Secondary | ICD-10-CM | POA: Diagnosis not present

## 2020-07-06 DIAGNOSIS — H35033 Hypertensive retinopathy, bilateral: Secondary | ICD-10-CM | POA: Diagnosis not present

## 2020-07-27 ENCOUNTER — Other Ambulatory Visit: Payer: Self-pay

## 2020-07-27 ENCOUNTER — Encounter: Payer: Self-pay | Admitting: Dermatology

## 2020-07-27 ENCOUNTER — Ambulatory Visit (INDEPENDENT_AMBULATORY_CARE_PROVIDER_SITE_OTHER): Payer: Medicare Other | Admitting: Dermatology

## 2020-07-27 DIAGNOSIS — C44319 Basal cell carcinoma of skin of other parts of face: Secondary | ICD-10-CM | POA: Diagnosis not present

## 2020-07-27 DIAGNOSIS — C4491 Basal cell carcinoma of skin, unspecified: Secondary | ICD-10-CM

## 2020-07-27 NOTE — Patient Instructions (Signed)

## 2020-07-30 ENCOUNTER — Encounter: Payer: Self-pay | Admitting: Dermatology

## 2020-07-30 NOTE — Progress Notes (Signed)
   Follow-Up Visit   Subjective  Kenneth Pope is a 87 y.o. male who presents for the following: Procedure (Here for treatment- left zygomatic area- nod & infil).  BCC Location: Left cheek Duration:  Quality:  Associated Signs/Symptoms: Modifying Factors: Infiltrative histology Severity:  Timing: Context: For treatment  Objective  Well appearing patient in no apparent distress; mood and affect are within normal limits. Objective  Left Zygomatic Area: Lesion identified by Dr.Kory Rains and nurse in room.   [id number]   Superior margin stain   A focused examination was performed including Head and neck.. Relevant physical exam findings are noted in the Assessment and Plan.   Assessment & Plan    Basal cell carcinoma (BCC), unspecified site Left Zygomatic Area  Skin excision  Lesion length (cm):  1.5 Lesion width (cm):  1 Margin per side (cm):  0.2 Total excision diameter (cm):  1.9 Informed consent: discussed and consent obtained   Timeout: patient name, date of birth, surgical site, and procedure verified   Anesthesia: the lesion was anesthetized in a standard fashion   Anesthetic:  1% lidocaine w/ epinephrine 1-100,000 local infiltration Instrument used: #15 blade   Hemostasis achieved with: pressure and electrodesiccation   Outcome: patient tolerated procedure well with no complications   Post-procedure details: sterile dressing applied and wound care instructions given   Dressing type: bandage, petrolatum and pressure dressing   Additional details:  Nylon x 3  Specimen 1 - Surgical pathology Differential Diagnosis: R/O BCC  Check Margins: Yes Superior Margin Stained Lesion identified by Dr.Ladonna Vanorder and nurse in room. [id number]   Curettage plus cautery x3 showed deep extension so this was followed by narrow margin excision and simple closure.     I, [name], MD, have reviewed all documentation for this visit.  The documentation on 08/04/20 for the  exam, diagnosis, procedures, and orders are all accurate and complete.

## 2020-08-02 ENCOUNTER — Other Ambulatory Visit: Payer: Self-pay

## 2020-08-02 ENCOUNTER — Ambulatory Visit: Payer: Medicare Other | Admitting: *Deleted

## 2020-08-02 DIAGNOSIS — C4491 Basal cell carcinoma of skin, unspecified: Secondary | ICD-10-CM

## 2020-08-02 NOTE — Progress Notes (Signed)
Here for nurse to see for suture removal x 3- no sign or symptoms of infection- no pathology given to patient because results aren't back yet. Informed patient to call us next week for results.

## 2020-08-03 ENCOUNTER — Encounter (INDEPENDENT_AMBULATORY_CARE_PROVIDER_SITE_OTHER): Payer: Medicare Other | Admitting: Ophthalmology

## 2020-08-03 DIAGNOSIS — H35033 Hypertensive retinopathy, bilateral: Secondary | ICD-10-CM

## 2020-08-03 DIAGNOSIS — H353112 Nonexudative age-related macular degeneration, right eye, intermediate dry stage: Secondary | ICD-10-CM

## 2020-08-03 DIAGNOSIS — H43813 Vitreous degeneration, bilateral: Secondary | ICD-10-CM

## 2020-08-03 DIAGNOSIS — I10 Essential (primary) hypertension: Secondary | ICD-10-CM | POA: Diagnosis not present

## 2020-08-03 DIAGNOSIS — H33301 Unspecified retinal break, right eye: Secondary | ICD-10-CM

## 2020-08-03 DIAGNOSIS — H353221 Exudative age-related macular degeneration, left eye, with active choroidal neovascularization: Secondary | ICD-10-CM | POA: Diagnosis not present

## 2020-08-04 NOTE — Addendum Note (Signed)
Addended by: [name] on: 08/04/2020 10:19 AM   Modules accepted: Orders

## 2020-08-07 ENCOUNTER — Telehealth: Payer: Self-pay

## 2020-08-07 NOTE — Telephone Encounter (Signed)
-----   Message from [name], MD sent at 08/04/2020  6:50 AM EST ----- Kenneth Pope should be coming in for suture removal next week.  Either have me see him when he returns or inform him that the microscopic on his skin cancer indicates there is a chance it could locally recur.  We usually recommend referral for Mohs surgery when this occurs, but if he prefers to have me watch the area this would be acceptable.

## 2020-08-07 NOTE — Telephone Encounter (Signed)
Patient aware of Dr Delma Officer message and he prefers to watch the area and see if it recurs. Path to patient

## 2020-08-15 DIAGNOSIS — H1045 Other chronic allergic conjunctivitis: Secondary | ICD-10-CM | POA: Diagnosis not present

## 2020-08-15 DIAGNOSIS — H401122 Primary open-angle glaucoma, left eye, moderate stage: Secondary | ICD-10-CM | POA: Diagnosis not present

## 2020-08-15 DIAGNOSIS — H401111 Primary open-angle glaucoma, right eye, mild stage: Secondary | ICD-10-CM | POA: Diagnosis not present

## 2020-08-31 ENCOUNTER — Other Ambulatory Visit: Payer: Self-pay

## 2020-08-31 ENCOUNTER — Encounter (INDEPENDENT_AMBULATORY_CARE_PROVIDER_SITE_OTHER): Payer: Medicare Other | Admitting: Ophthalmology

## 2020-08-31 DIAGNOSIS — H353221 Exudative age-related macular degeneration, left eye, with active choroidal neovascularization: Secondary | ICD-10-CM | POA: Diagnosis not present

## 2020-08-31 DIAGNOSIS — H43813 Vitreous degeneration, bilateral: Secondary | ICD-10-CM

## 2020-08-31 DIAGNOSIS — H33301 Unspecified retinal break, right eye: Secondary | ICD-10-CM

## 2020-08-31 DIAGNOSIS — H353112 Nonexudative age-related macular degeneration, right eye, intermediate dry stage: Secondary | ICD-10-CM | POA: Diagnosis not present

## 2020-08-31 DIAGNOSIS — I10 Essential (primary) hypertension: Secondary | ICD-10-CM | POA: Diagnosis not present

## 2020-08-31 DIAGNOSIS — H35033 Hypertensive retinopathy, bilateral: Secondary | ICD-10-CM

## 2020-09-28 ENCOUNTER — Encounter (INDEPENDENT_AMBULATORY_CARE_PROVIDER_SITE_OTHER): Payer: Medicare Other | Admitting: Ophthalmology

## 2020-09-28 ENCOUNTER — Other Ambulatory Visit: Payer: Self-pay

## 2020-09-28 DIAGNOSIS — I10 Essential (primary) hypertension: Secondary | ICD-10-CM

## 2020-09-28 DIAGNOSIS — H43813 Vitreous degeneration, bilateral: Secondary | ICD-10-CM

## 2020-09-28 DIAGNOSIS — H35033 Hypertensive retinopathy, bilateral: Secondary | ICD-10-CM

## 2020-09-28 DIAGNOSIS — H353112 Nonexudative age-related macular degeneration, right eye, intermediate dry stage: Secondary | ICD-10-CM | POA: Diagnosis not present

## 2020-09-28 DIAGNOSIS — H33301 Unspecified retinal break, right eye: Secondary | ICD-10-CM

## 2020-09-28 DIAGNOSIS — H353221 Exudative age-related macular degeneration, left eye, with active choroidal neovascularization: Secondary | ICD-10-CM

## 2020-10-26 ENCOUNTER — Encounter (INDEPENDENT_AMBULATORY_CARE_PROVIDER_SITE_OTHER): Payer: Medicare Other | Admitting: Ophthalmology

## 2020-10-26 ENCOUNTER — Other Ambulatory Visit: Payer: Self-pay

## 2020-10-26 DIAGNOSIS — H35033 Hypertensive retinopathy, bilateral: Secondary | ICD-10-CM

## 2020-10-26 DIAGNOSIS — H353221 Exudative age-related macular degeneration, left eye, with active choroidal neovascularization: Secondary | ICD-10-CM | POA: Diagnosis not present

## 2020-10-26 DIAGNOSIS — H353112 Nonexudative age-related macular degeneration, right eye, intermediate dry stage: Secondary | ICD-10-CM

## 2020-10-26 DIAGNOSIS — H43813 Vitreous degeneration, bilateral: Secondary | ICD-10-CM

## 2020-10-26 DIAGNOSIS — I10 Essential (primary) hypertension: Secondary | ICD-10-CM

## 2020-10-26 DIAGNOSIS — H33301 Unspecified retinal break, right eye: Secondary | ICD-10-CM

## 2020-11-23 ENCOUNTER — Encounter (INDEPENDENT_AMBULATORY_CARE_PROVIDER_SITE_OTHER): Payer: Medicare Other | Admitting: Ophthalmology

## 2020-11-23 ENCOUNTER — Other Ambulatory Visit: Payer: Self-pay

## 2020-11-23 DIAGNOSIS — H353221 Exudative age-related macular degeneration, left eye, with active choroidal neovascularization: Secondary | ICD-10-CM

## 2020-11-23 DIAGNOSIS — H35033 Hypertensive retinopathy, bilateral: Secondary | ICD-10-CM

## 2020-11-23 DIAGNOSIS — I10 Essential (primary) hypertension: Secondary | ICD-10-CM | POA: Diagnosis not present

## 2020-11-23 DIAGNOSIS — H33301 Unspecified retinal break, right eye: Secondary | ICD-10-CM

## 2020-11-23 DIAGNOSIS — H43813 Vitreous degeneration, bilateral: Secondary | ICD-10-CM

## 2020-11-23 DIAGNOSIS — H353112 Nonexudative age-related macular degeneration, right eye, intermediate dry stage: Secondary | ICD-10-CM | POA: Diagnosis not present

## 2020-12-06 ENCOUNTER — Encounter: Payer: Self-pay | Admitting: Dermatology

## 2020-12-06 ENCOUNTER — Other Ambulatory Visit: Payer: Self-pay | Admitting: Dermatology

## 2020-12-06 ENCOUNTER — Ambulatory Visit: Payer: Medicare Other | Admitting: Dermatology

## 2020-12-06 ENCOUNTER — Other Ambulatory Visit: Payer: Self-pay

## 2020-12-06 DIAGNOSIS — D485 Neoplasm of uncertain behavior of skin: Secondary | ICD-10-CM

## 2020-12-06 DIAGNOSIS — L821 Other seborrheic keratosis: Secondary | ICD-10-CM

## 2020-12-06 DIAGNOSIS — D0439 Carcinoma in situ of skin of other parts of face: Secondary | ICD-10-CM | POA: Diagnosis not present

## 2020-12-06 DIAGNOSIS — L570 Actinic keratosis: Secondary | ICD-10-CM

## 2020-12-06 DIAGNOSIS — Z85828 Personal history of other malignant neoplasm of skin: Secondary | ICD-10-CM

## 2020-12-06 DIAGNOSIS — C44319 Basal cell carcinoma of skin of other parts of face: Secondary | ICD-10-CM

## 2020-12-06 NOTE — Patient Instructions (Signed)

## 2020-12-14 ENCOUNTER — Telehealth: Payer: Self-pay

## 2020-12-14 NOTE — Telephone Encounter (Signed)
Path to patient and surgery was made at last visit

## 2020-12-14 NOTE — Telephone Encounter (Signed)
-----   Message from [name], MD sent at 12/14/2020  7:00 AM EDT ----- Schedule surgery with Dr. Darene Lamer

## 2020-12-17 ENCOUNTER — Encounter: Payer: Self-pay | Admitting: Dermatology

## 2020-12-17 NOTE — Progress Notes (Signed)
   Follow-Up Visit   Subjective  Kenneth Pope is a 87 y.o. male who presents for the following: Skin Problem (Patient here today for recheck of lesions on his face x years. No bleeding, no pain from lesions. Personal history of non mole skin cancer. No personal history of atypical moles, or melanoma. No family history of atypical moles, melanoma or non mole skin cancer.).  Multiple crusts to check in gentleman with history of skin cancer. Location:  Duration:  Quality:  Associated Signs/Symptoms: Modifying Factors:  Severity:  Timing: Context:   Objective  Well appearing patient in no apparent distress; mood and affect are within normal limits. Left Buccal Cheek Waxy 6 mm pink papule       Right Buccal Cheek Elongated 1.1 cm pink-white crust       Left Temporal Scalp (2), Mid Frontal Scalp, Mid Parietal Scalp (5) Multiple 2 to 4 mm gritty pink crusts  Right Postauricular Area, Right Temple 5 mm flattopped textured brown papules    All skin waist up examined.   Assessment & Plan    Neoplasm of uncertain behavior of skin (2) Left Buccal Cheek  Skin / nail biopsy Type of biopsy: tangential   Informed consent: discussed and consent obtained   Timeout: patient name, date of birth, surgical site, and procedure verified   Procedure prep:  Patient was prepped and draped in usual sterile fashion (Non sterile) Prep type:  Chlorhexidine Anesthesia: the lesion was anesthetized in a standard fashion   Anesthetic:  1% lidocaine w/ epinephrine 1-100,000 local infiltration Instrument used: flexible razor blade   Hemostasis achieved with: ferric subsulfate   Outcome: patient tolerated procedure well   Post-procedure details: sterile dressing applied and wound care instructions given   Dressing type: bandage and petrolatum    Specimen 1 - Surgical pathology Differential Diagnosis: R/O BCC vs SCC  Check Margins: No  Right Buccal Cheek  Skin / nail biopsy Type of  biopsy: tangential   Informed consent: discussed and consent obtained   Timeout: patient name, date of birth, surgical site, and procedure verified   Procedure prep:  Patient was prepped and draped in usual sterile fashion (Non sterile) Prep type:  Chlorhexidine Anesthesia: the lesion was anesthetized in a standard fashion   Anesthetic:  1% lidocaine w/ epinephrine 1-100,000 local infiltration Instrument used: flexible razor blade   Hemostasis achieved with: ferric subsulfate   Outcome: patient tolerated procedure well   Post-procedure details: sterile dressing applied and wound care instructions given   Dressing type: bandage and petrolatum    Specimen 2 - Surgical pathology Differential Diagnosis: R/O BCC vs SCC  Check Margins: No  AK (actinic keratosis) (8) Mid Frontal Scalp; Mid Parietal Scalp (5); Left Temporal Scalp (2)  Destruction of lesion - Left Temporal Scalp, Mid Frontal Scalp, Mid Parietal Scalp Complexity: simple   Destruction method: cryotherapy   Informed consent: discussed and consent obtained   Timeout:  patient name, date of birth, surgical site, and procedure verified Lesion destroyed using liquid nitrogen: Yes   Cryotherapy cycles:  3 Outcome: patient tolerated procedure well with no complications   Post-procedure details: wound care instructions given    Seborrheic keratosis (2) Right Temple; Right Postauricular Area  Leave if stable      I, [name], MD, have reviewed all documentation for this visit.  The documentation on 12/17/20 for the exam, diagnosis, procedures, and orders are all accurate and complete.

## 2020-12-21 ENCOUNTER — Other Ambulatory Visit: Payer: Self-pay

## 2020-12-21 ENCOUNTER — Encounter (INDEPENDENT_AMBULATORY_CARE_PROVIDER_SITE_OTHER): Payer: Medicare Other | Admitting: Ophthalmology

## 2020-12-21 DIAGNOSIS — I10 Essential (primary) hypertension: Secondary | ICD-10-CM

## 2020-12-21 DIAGNOSIS — H43813 Vitreous degeneration, bilateral: Secondary | ICD-10-CM

## 2020-12-21 DIAGNOSIS — H35033 Hypertensive retinopathy, bilateral: Secondary | ICD-10-CM | POA: Diagnosis not present

## 2020-12-21 DIAGNOSIS — H33301 Unspecified retinal break, right eye: Secondary | ICD-10-CM

## 2020-12-21 DIAGNOSIS — H353112 Nonexudative age-related macular degeneration, right eye, intermediate dry stage: Secondary | ICD-10-CM

## 2020-12-21 DIAGNOSIS — H353221 Exudative age-related macular degeneration, left eye, with active choroidal neovascularization: Secondary | ICD-10-CM

## 2020-12-30 ENCOUNTER — Other Ambulatory Visit: Payer: Self-pay | Admitting: Cardiology

## 2021-01-06 ENCOUNTER — Other Ambulatory Visit: Payer: Self-pay | Admitting: Cardiology

## 2021-01-06 DIAGNOSIS — E785 Hyperlipidemia, unspecified: Secondary | ICD-10-CM

## 2021-01-09 ENCOUNTER — Other Ambulatory Visit: Payer: Self-pay

## 2021-01-18 ENCOUNTER — Ambulatory Visit (INDEPENDENT_AMBULATORY_CARE_PROVIDER_SITE_OTHER): Payer: Medicare Other | Admitting: Dermatology

## 2021-01-18 ENCOUNTER — Encounter: Payer: Self-pay | Admitting: Dermatology

## 2021-01-18 ENCOUNTER — Other Ambulatory Visit: Payer: Self-pay

## 2021-01-18 DIAGNOSIS — D049 Carcinoma in situ of skin, unspecified: Secondary | ICD-10-CM

## 2021-01-18 DIAGNOSIS — C44319 Basal cell carcinoma of skin of other parts of face: Secondary | ICD-10-CM | POA: Diagnosis not present

## 2021-01-18 DIAGNOSIS — D0439 Carcinoma in situ of skin of other parts of face: Secondary | ICD-10-CM | POA: Diagnosis not present

## 2021-01-18 DIAGNOSIS — C4491 Basal cell carcinoma of skin, unspecified: Secondary | ICD-10-CM

## 2021-01-18 NOTE — Patient Instructions (Signed)

## 2021-01-22 ENCOUNTER — Other Ambulatory Visit: Payer: Self-pay

## 2021-01-22 ENCOUNTER — Encounter (INDEPENDENT_AMBULATORY_CARE_PROVIDER_SITE_OTHER): Payer: Medicare Other | Admitting: Ophthalmology

## 2021-01-22 DIAGNOSIS — I10 Essential (primary) hypertension: Secondary | ICD-10-CM | POA: Diagnosis not present

## 2021-01-22 DIAGNOSIS — H353112 Nonexudative age-related macular degeneration, right eye, intermediate dry stage: Secondary | ICD-10-CM

## 2021-01-22 DIAGNOSIS — H43813 Vitreous degeneration, bilateral: Secondary | ICD-10-CM

## 2021-01-22 DIAGNOSIS — H35033 Hypertensive retinopathy, bilateral: Secondary | ICD-10-CM

## 2021-01-22 DIAGNOSIS — H33301 Unspecified retinal break, right eye: Secondary | ICD-10-CM

## 2021-01-22 DIAGNOSIS — H353221 Exudative age-related macular degeneration, left eye, with active choroidal neovascularization: Secondary | ICD-10-CM | POA: Diagnosis not present

## 2021-02-04 NOTE — Progress Notes (Signed)
Cardiology Office Note   Date:  02/05/2021   ID:  Ryanchristopher, Gropp [birth year], MRN [id number]  PCP:  Reynold Bowen, MD  Cardiologist:   Minus Breeding, MD   Chief Complaint  Patient presents with   Coronary Artery Disease       History of Present Illness: Kenneth Pope is a 87 y.o. male who presents for follow-up of coronary disease.  He was admitted in 2019 with syncope.  He had an elevated troponin.  He eventually had a cardiac catheterization demonstrating 50% stenosis in the LAD.  He had 85% stenosis in the right coronary artery.  He had DES to the RCA.  He was noted also to have probable coronary spasm in the LAD.  His syncope was thought to be related possibly to an arrhythmia and he was started on a beta-blocker.  ACE inhibitor was stopped.  He did have an event monitor and no arrhythmia was noted.    Since he was last seen he has done well.  He walks for exercise. The patient denies any new symptoms such as chest discomfort, neck or arm discomfort. There has been no new shortness of breath, PND or orthopnea. There have been no reported palpitations, presyncope or syncope.   Past Medical History:  Diagnosis Date   Arthritis    BCC (basal cell carcinoma of skin) 12/08/2013   Bridge of Nose and Right Ear   CAD (coronary artery disease) 05/04/2018   a. 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   Mid Forehead   Superficial basal cell carcinoma (BCC) 11/30/2019   Dorsum of Nose    Past Surgical History:  Procedure Laterality Date   CARDIAC CATHETERIZATION     CORONARY STENT INTERVENTION N/A 05/05/2018   Procedure: CORONARY STENT INTERVENTION;  Surgeon: Troy Sine, MD;  Location: [facility];  Service: Cardiovascular;  Laterality: N/A;   HERNIA REPAIR     LEFT HEART CATH AND CORONARY ANGIOGRAPHY N/A 05/05/2018   Procedure: LEFT HEART CATH AND CORONARY ANGIOGRAPHY;  Surgeon: Troy Sine, MD;  Location: [facility];  Service: Cardiovascular;  Laterality: N/A;     Current Outpatient Medications  Medication Sig Dispense Refill   amLODipine (NORVASC) 10 MG tablet TAKE 1 TABLET BY MOUTH EVERY DAY 90 tablet 2   aspirin EC 81 MG tablet Take 81 mg by mouth daily.     atorvastatin (LIPITOR) 80 MG tablet TAKE 1 TABLET(80 MG) BY MOUTH DAILY AT 6 PM 30 tablet 1   carvedilol (COREG) 3.125 MG tablet TAKE 1 TABLET(3.125 MG) BY MOUTH TWICE DAILY WITH A MEAL 180 tablet 3   Multiple Vitamin (MULTIVITAMIN WITH MINERALS) TABS tablet Take 1 tablet by mouth daily.     Multiple Vitamins-Minerals (PRESERVISION AREDS 2) CAPS Take 1 capsule by mouth  2 (two) times daily.      nitroGLYCERIN (NITROSTAT) 0.4 MG SL tablet Place 1 tablet (0.4 mg total) under the tongue every 5 (five) minutes as needed for chest pain. 25 tablet 11   omega-3 acid ethyl esters (LOVAZA) 1 g capsule Take 1 g by mouth 2 (two) times daily.     timolol (TIMOPTIC) 0.5 % ophthalmic solution 1 drop every morning.     No current facility-administered medications for this visit.    Allergies:   Patient has no known allergies.     ROS:  Please see the history of present illness.   Otherwise, review of systems are positive for none.   All other systems are reviewed and negative.    PHYSICAL EXAM: VS:  BP 132/78   Pulse 61   Ht '5\' 8"'$  (1.727 m)   Wt 170 lb 12.8 oz (77.5 kg)   SpO2 96%   BMI 25.97  kg/m  , BMI Body mass index is 25.97 kg/m. GENERAL:  Well appearing NECK:  No jugular venous distention, waveform within normal limits, carotid upstroke brisk and symmetric, no bruits, no thyromegaly LUNGS:  Clear to auscultation bilaterally CHEST:  Unremarkable HEART:  PMI not displaced or sustained,S1 and S2 within normal limits, no S3, no S4, no clicks, no rubs, no murmurs ABD:  Flat, positive bowel sounds normal in frequency in pitch, no bruits, no rebound, no guarding, no midline pulsatile mass, no hepatomegaly, no splenomegaly EXT:  2 plus pulses throughout, no edema, no cyanosis no clubbing   EKG:  EKG is ordered today. Sinus bradycardia, rate 52, axis within normal limits, intervals within normal limits, no acute ST-T wave changes.  Recent Labs: No results found for requested labs within last 8760 hours.    Lipid Panel    Component Value Date/Time   CHOL 135 05/05/2018 0219   TRIG 51 05/05/2018 0219   HDL 56 05/05/2018 0219   CHOLHDL 2.4 05/05/2018 0219   VLDL 10 05/05/2018 0219   LDLCALC 69 05/05/2018 0219      Wt Readings from Last 3 Encounters:  02/05/21 170 lb 12.8 oz (77.5 kg)  01/17/20 169 lb 9.6 oz (76.9 kg)  01/01/19 171 lb 9.6 oz (77.8 kg)      Other studies Reviewed: Additional studies/ records that were reviewed today include:   Labs Review of the above records demonstrates:  Please see elsewhere in the note.     ASSESSMENT AND PLAN:  CAD:   The patient has no new sypmtoms.  No further cardiovascular testing is indicated.  We will continue with aggressive risk reduction and meds as listed.  HTN:   Blood pressure is at target.  No change in therapy.   DYSLIPIDEMIA:   LDL was 53 with an HDL of 52.  This was August of last year and he is due to have this repeated.  No change in therapy.      Current medicines are reviewed at length with the patient today.  The patient does not have concerns regarding medicines.  The following changes have been  made:  None  Labs/ tests ordered today include:  None  Orders Placed This Encounter  Procedures   EKG 12-Lead      Disposition:   FU with me in one year.    Signed, Minus Breeding, MD  02/05/2021 11:56 AM    [place]

## 2021-02-05 ENCOUNTER — Encounter: Payer: Self-pay | Admitting: Dermatology

## 2021-02-05 ENCOUNTER — Ambulatory Visit: Payer: Medicare Other | Admitting: Cardiology

## 2021-02-05 ENCOUNTER — Other Ambulatory Visit: Payer: Self-pay

## 2021-02-05 ENCOUNTER — Encounter: Payer: Self-pay | Admitting: Cardiology

## 2021-02-05 VITALS — BP 132/78 | HR 61 | Ht 68.0 in | Wt 170.8 lb

## 2021-02-05 DIAGNOSIS — E785 Hyperlipidemia, unspecified: Secondary | ICD-10-CM | POA: Diagnosis not present

## 2021-02-05 DIAGNOSIS — I10 Essential (primary) hypertension: Secondary | ICD-10-CM | POA: Diagnosis not present

## 2021-02-05 DIAGNOSIS — M7989 Other specified soft tissue disorders: Secondary | ICD-10-CM

## 2021-02-05 DIAGNOSIS — I2510 Atherosclerotic heart disease of native coronary artery without angina pectoris: Secondary | ICD-10-CM | POA: Diagnosis not present

## 2021-02-05 NOTE — Patient Instructions (Signed)
Medication Instructions:  Your physician recommends that you continue on your current medications as directed. Please refer to the Current Medication list given to you today.  *If you need a refill on your cardiac medications before your next appointment, please call your pharmacy*   Follow-Up: At [facility], you and your health needs are our priority.  As part of our continuing mission to provide you with exceptional heart care, we have created designated Provider Care Teams.  These Care Teams include your primary Cardiologist (physician) and Advanced Practice Providers (APPs -  Physician Assistants and Nurse Practitioners) who all work together to provide you with the care you need, when you need it.  We recommend signing up for the patient portal called "MyChart".  Sign up information is provided on this After Visit Summary.  MyChart is used to connect with patients for Virtual Visits (Telemedicine).  Patients are able to view lab/test results, encounter notes, upcoming appointments, etc.  Non-urgent messages can be sent to your provider as well.   To learn more about what you can do with MyChart, go to [email or web address].    Your next appointment:   12 month(s)  The format for your next appointment:   In Person  Provider:   You may see Minus Breeding, MD or one of the following Advanced Practice Providers on your designated Care Team:   Rosaria Ferries, PA-C Caron Presume, PA-C Jory Sims, DNP, ANP   Other Instructions

## 2021-02-05 NOTE — Progress Notes (Signed)
   Follow-Up Visit   Subjective  Kenneth Pope is a 87 y.o. male who presents for the following: Procedure (Here for tx- left buccal cheek- bcc x 1 & right buccal cheek- scc x 1).  2 nonmelanoma skin cancers on face Location:  Duration:  Quality:  Associated Signs/Symptoms: Modifying Factors:  Severity:  Timing: Context:   Objective  Well appearing patient in no apparent distress; mood and affect are within normal limits. Right Buccal Cheek Lesion identified by nurse and Dr.Kaiyana Bedore in room.  Left Buccal Cheek Lesion identified by nurse and Dr.Demitrius Crass in room.    A focused examination was performed including head and neck. Relevant physical exam findings are noted in the Assessment and Plan.   Assessment & Plan    Squamous cell carcinoma in situ (SCCIS) of skin Right Buccal Cheek  Destruction of lesion Complexity: simple   Destruction method: electrodesiccation and curettage   Informed consent: discussed and consent obtained   Timeout:  patient name, date of birth, surgical site, and procedure verified Anesthesia: the lesion was anesthetized in a standard fashion   Anesthetic:  1% lidocaine w/ epinephrine 1-100,000 local infiltration Curettage performed in three different directions: Yes   Curettage cycles:  3 Lesion length (cm):  1.5 Lesion width (cm):  1.5 Margin per side (cm):  0 Final wound size (cm):  1.5 Hemostasis achieved with:  ferric subsulfate Outcome: patient tolerated procedure well with no complications   Post-procedure details: wound care instructions given   Additional details:  Wound innoculated with 5 fluorouracil solution.  Basal cell carcinoma (BCC), unspecified site Left Buccal Cheek  Destruction of lesion Complexity: simple   Destruction method: electrodesiccation and curettage   Informed consent: discussed and consent obtained   Timeout:  patient name, date of birth, surgical site, and procedure verified Anesthesia: the lesion was  anesthetized in a standard fashion   Anesthetic:  1% lidocaine w/ epinephrine 1-100,000 local infiltration Curettage performed in three different directions: Yes   Curettage cycles:  3 Lesion length (cm):  2 Lesion width (cm):  2 Margin per side (cm):  0 Final wound size (cm):  2 Hemostasis achieved with:  ferric subsulfate Outcome: patient tolerated procedure well with no complications   Post-procedure details: wound care instructions given   Additional details:  Wound innoculated with 5 fluorouracil solution.      I, [name], MD, have reviewed all documentation for this visit.  The documentation on 02/05/21 for the exam, diagnosis, procedures, and orders are all accurate and complete.

## 2021-02-06 ENCOUNTER — Other Ambulatory Visit: Payer: Self-pay | Admitting: Cardiology

## 2021-02-06 DIAGNOSIS — E785 Hyperlipidemia, unspecified: Secondary | ICD-10-CM

## 2021-02-13 DIAGNOSIS — Z961 Presence of intraocular lens: Secondary | ICD-10-CM | POA: Diagnosis not present

## 2021-02-13 DIAGNOSIS — H401111 Primary open-angle glaucoma, right eye, mild stage: Secondary | ICD-10-CM | POA: Diagnosis not present

## 2021-02-13 DIAGNOSIS — H1045 Other chronic allergic conjunctivitis: Secondary | ICD-10-CM | POA: Diagnosis not present

## 2021-02-13 DIAGNOSIS — H401122 Primary open-angle glaucoma, left eye, moderate stage: Secondary | ICD-10-CM | POA: Diagnosis not present

## 2021-02-15 DIAGNOSIS — R7301 Impaired fasting glucose: Secondary | ICD-10-CM | POA: Diagnosis not present

## 2021-02-15 DIAGNOSIS — Z125 Encounter for screening for malignant neoplasm of prostate: Secondary | ICD-10-CM | POA: Diagnosis not present

## 2021-02-15 DIAGNOSIS — E785 Hyperlipidemia, unspecified: Secondary | ICD-10-CM | POA: Diagnosis not present

## 2021-02-20 ENCOUNTER — Encounter (INDEPENDENT_AMBULATORY_CARE_PROVIDER_SITE_OTHER): Payer: Medicare Other | Admitting: Ophthalmology

## 2021-02-20 ENCOUNTER — Other Ambulatory Visit: Payer: Self-pay

## 2021-02-20 DIAGNOSIS — H353221 Exudative age-related macular degeneration, left eye, with active choroidal neovascularization: Secondary | ICD-10-CM

## 2021-02-20 DIAGNOSIS — H43813 Vitreous degeneration, bilateral: Secondary | ICD-10-CM

## 2021-02-20 DIAGNOSIS — H35033 Hypertensive retinopathy, bilateral: Secondary | ICD-10-CM | POA: Diagnosis not present

## 2021-02-20 DIAGNOSIS — H353112 Nonexudative age-related macular degeneration, right eye, intermediate dry stage: Secondary | ICD-10-CM

## 2021-02-20 DIAGNOSIS — H33301 Unspecified retinal break, right eye: Secondary | ICD-10-CM

## 2021-02-20 DIAGNOSIS — I10 Essential (primary) hypertension: Secondary | ICD-10-CM

## 2021-02-22 DIAGNOSIS — I2510 Atherosclerotic heart disease of native coronary artery without angina pectoris: Secondary | ICD-10-CM | POA: Diagnosis not present

## 2021-02-22 DIAGNOSIS — Z0000 Encounter for general adult medical examination without abnormal findings: Secondary | ICD-10-CM | POA: Diagnosis not present

## 2021-02-22 DIAGNOSIS — I10 Essential (primary) hypertension: Secondary | ICD-10-CM | POA: Diagnosis not present

## 2021-02-22 DIAGNOSIS — R7301 Impaired fasting glucose: Secondary | ICD-10-CM | POA: Diagnosis not present

## 2021-02-22 DIAGNOSIS — R82998 Other abnormal findings in urine: Secondary | ICD-10-CM | POA: Diagnosis not present

## 2021-02-22 DIAGNOSIS — E785 Hyperlipidemia, unspecified: Secondary | ICD-10-CM | POA: Diagnosis not present

## 2021-03-20 ENCOUNTER — Other Ambulatory Visit: Payer: Self-pay

## 2021-03-20 ENCOUNTER — Encounter (INDEPENDENT_AMBULATORY_CARE_PROVIDER_SITE_OTHER): Payer: Medicare Other | Admitting: Ophthalmology

## 2021-03-20 DIAGNOSIS — H43813 Vitreous degeneration, bilateral: Secondary | ICD-10-CM

## 2021-03-20 DIAGNOSIS — I10 Essential (primary) hypertension: Secondary | ICD-10-CM

## 2021-03-20 DIAGNOSIS — H353112 Nonexudative age-related macular degeneration, right eye, intermediate dry stage: Secondary | ICD-10-CM | POA: Diagnosis not present

## 2021-03-20 DIAGNOSIS — H353221 Exudative age-related macular degeneration, left eye, with active choroidal neovascularization: Secondary | ICD-10-CM | POA: Diagnosis not present

## 2021-03-20 DIAGNOSIS — H33301 Unspecified retinal break, right eye: Secondary | ICD-10-CM

## 2021-03-20 DIAGNOSIS — H35033 Hypertensive retinopathy, bilateral: Secondary | ICD-10-CM

## 2021-04-09 ENCOUNTER — Other Ambulatory Visit: Payer: Self-pay

## 2021-04-09 ENCOUNTER — Ambulatory Visit (INDEPENDENT_AMBULATORY_CARE_PROVIDER_SITE_OTHER): Payer: Medicare Other | Admitting: Dermatology

## 2021-04-09 DIAGNOSIS — Z5321 Procedure and treatment not carried out due to patient leaving prior to being seen by health care provider: Secondary | ICD-10-CM

## 2021-04-17 ENCOUNTER — Encounter (INDEPENDENT_AMBULATORY_CARE_PROVIDER_SITE_OTHER): Payer: Medicare Other | Admitting: Ophthalmology

## 2021-04-17 ENCOUNTER — Other Ambulatory Visit: Payer: Self-pay

## 2021-04-17 DIAGNOSIS — H353221 Exudative age-related macular degeneration, left eye, with active choroidal neovascularization: Secondary | ICD-10-CM | POA: Diagnosis not present

## 2021-04-17 DIAGNOSIS — I10 Essential (primary) hypertension: Secondary | ICD-10-CM | POA: Diagnosis not present

## 2021-04-17 DIAGNOSIS — H353112 Nonexudative age-related macular degeneration, right eye, intermediate dry stage: Secondary | ICD-10-CM | POA: Diagnosis not present

## 2021-04-17 DIAGNOSIS — H43813 Vitreous degeneration, bilateral: Secondary | ICD-10-CM

## 2021-04-17 DIAGNOSIS — H35033 Hypertensive retinopathy, bilateral: Secondary | ICD-10-CM

## 2021-05-09 ENCOUNTER — Other Ambulatory Visit: Payer: Self-pay | Admitting: Cardiology

## 2021-05-09 DIAGNOSIS — E785 Hyperlipidemia, unspecified: Secondary | ICD-10-CM

## 2021-05-15 ENCOUNTER — Encounter (INDEPENDENT_AMBULATORY_CARE_PROVIDER_SITE_OTHER): Payer: Medicare Other | Admitting: Ophthalmology

## 2021-05-15 ENCOUNTER — Other Ambulatory Visit: Payer: Self-pay

## 2021-05-15 DIAGNOSIS — I10 Essential (primary) hypertension: Secondary | ICD-10-CM

## 2021-05-15 DIAGNOSIS — H33301 Unspecified retinal break, right eye: Secondary | ICD-10-CM

## 2021-05-15 DIAGNOSIS — H35033 Hypertensive retinopathy, bilateral: Secondary | ICD-10-CM | POA: Diagnosis not present

## 2021-05-15 DIAGNOSIS — H353221 Exudative age-related macular degeneration, left eye, with active choroidal neovascularization: Secondary | ICD-10-CM | POA: Diagnosis not present

## 2021-05-15 DIAGNOSIS — H353112 Nonexudative age-related macular degeneration, right eye, intermediate dry stage: Secondary | ICD-10-CM | POA: Diagnosis not present

## 2021-05-15 DIAGNOSIS — H43813 Vitreous degeneration, bilateral: Secondary | ICD-10-CM

## 2021-05-21 ENCOUNTER — Ambulatory Visit (INDEPENDENT_AMBULATORY_CARE_PROVIDER_SITE_OTHER): Payer: Medicare Other | Admitting: Dermatology

## 2021-05-21 ENCOUNTER — Other Ambulatory Visit: Payer: Self-pay

## 2021-05-21 ENCOUNTER — Encounter: Payer: Self-pay | Admitting: Dermatology

## 2021-05-21 DIAGNOSIS — D485 Neoplasm of uncertain behavior of skin: Secondary | ICD-10-CM | POA: Diagnosis not present

## 2021-05-21 DIAGNOSIS — L820 Inflamed seborrheic keratosis: Secondary | ICD-10-CM | POA: Diagnosis not present

## 2021-05-21 DIAGNOSIS — D044 Carcinoma in situ of skin of scalp and neck: Secondary | ICD-10-CM | POA: Diagnosis not present

## 2021-05-21 DIAGNOSIS — D0439 Carcinoma in situ of skin of other parts of face: Secondary | ICD-10-CM | POA: Diagnosis not present

## 2021-05-21 DIAGNOSIS — L821 Other seborrheic keratosis: Secondary | ICD-10-CM | POA: Diagnosis not present

## 2021-05-21 DIAGNOSIS — L570 Actinic keratosis: Secondary | ICD-10-CM | POA: Diagnosis not present

## 2021-05-21 NOTE — Patient Instructions (Signed)

## 2021-05-31 ENCOUNTER — Telehealth: Payer: Self-pay | Admitting: Dermatology

## 2021-05-31 NOTE — Telephone Encounter (Signed)
Pathology to patient.  °

## 2021-05-31 NOTE — Telephone Encounter (Signed)
Patient is calling for results from last visit with [name], M.D.

## 2021-06-09 ENCOUNTER — Encounter: Payer: Self-pay | Admitting: Dermatology

## 2021-06-09 NOTE — Progress Notes (Signed)
Follow-Up Visit   Subjective  Kenneth Pope is a 87 y.o. male who presents for the following: Annual Exam (Few scaly spots on face).  Annual skin examination, enlarging crusts on face. Location:  Duration:  Quality:  Associated Signs/Symptoms: Modifying Factors:  Severity:  Timing: Context:   Objective  Well appearing patient in no apparent distress; mood and affect are within normal limits. Right Forehead 1.2 cm waxy crust, rule out superficial carcinoma       Right Zygomatic Area 9 mm waxy crust, rule out superficial carcinoma       Left Temporal Scalp  Left Temporal Scalp Centrally eroded 1 cm waxy crust, rule out carcinoma         Waist up skin examination   Assessment & Plan    Neoplasm of uncertain behavior of skin (2) Right Forehead  Skin / nail biopsy Type of biopsy: tangential   Informed consent: discussed and consent obtained   Timeout: patient name, date of birth, surgical site, and procedure verified   Procedure prep:  Patient was prepped and draped in usual sterile fashion (Non sterile) Prep type:  Chlorhexidine Anesthesia: the lesion was anesthetized in a standard fashion   Anesthetic:  1% lidocaine w/ epinephrine 1-100,000 local infiltration Instrument used: flexible razor blade   Outcome: patient tolerated procedure well   Post-procedure details: wound care instructions given    Destruction of lesion Complexity: simple   Destruction method: electrodesiccation and curettage   Informed consent: discussed and consent obtained   Timeout:  patient name, date of birth, surgical site, and procedure verified Anesthesia: the lesion was anesthetized in a standard fashion   Anesthetic:  1% lidocaine w/ epinephrine 1-100,000 local infiltration Curettage performed in three different directions: Yes   Curettage cycles:  1 Margin per side (cm):  0.1 Final wound size (cm):  0.9 Hemostasis achieved with:  aluminum chloride Outcome:  patient tolerated procedure well with no complications   Post-procedure details: wound care instructions given    Specimen 2 - Surgical pathology Differential Diagnosis: bcc vs [id number]-txpbx  Check Margins: No  Right Zygomatic Area  Skin / nail biopsy Type of biopsy: tangential   Informed consent: discussed and consent obtained   Timeout: patient name, date of birth, surgical site, and procedure verified   Procedure prep:  Patient was prepped and draped in usual sterile fashion (Non sterile) Prep type:  Chlorhexidine Anesthesia: the lesion was anesthetized in a standard fashion   Anesthetic:  1% lidocaine w/ epinephrine 1-100,000 local infiltration Instrument used: flexible razor blade   Outcome: patient tolerated procedure well   Post-procedure details: wound care instructions given    Specimen 3 - Surgical pathology Differential Diagnosis: bcc vs scc  Check Margins: No  After shave biopsy the base of each lesion was treated with curettage plus cautery  Squamous cell carcinoma in situ (SCCIS) of skin of left temple region Left Temporal Scalp  After shave biopsy the base of the lesion was treated with curettage plus cautery.  Skin / nail biopsy - Left Temporal Scalp Type of biopsy: tangential   Informed consent: discussed and consent obtained   Timeout: patient name, date of birth, surgical site, and procedure verified   Procedure prep:  Patient was prepped and draped in usual sterile fashion (Non sterile) Prep type:  Chlorhexidine Anesthesia: the lesion was anesthetized in a standard fashion   Anesthetic:  1% lidocaine w/ epinephrine 1-100,000 local infiltration Instrument used: flexible razor blade   Outcome: patient tolerated procedure well  Post-procedure details: wound care instructions given    Destruction of lesion - Left Temporal Scalp Complexity: simple   Destruction method: electrodesiccation and curettage   Informed consent: discussed and consent obtained    Timeout:  patient name, date of birth, surgical site, and procedure verified Anesthesia: the lesion was anesthetized in a standard fashion   Anesthetic:  1% lidocaine w/ epinephrine 1-100,000 local infiltration Curettage performed in three different directions: Yes   Curettage cycles:  1 Lesion length (cm):  1.2 Lesion width (cm):  1.2 Margin per side (cm):  0 Final wound size (cm):  1.2 Hemostasis achieved with:  aluminum chloride Outcome: patient tolerated procedure well with no complications   Post-procedure details: wound care instructions given    Specimen 1 - Surgical pathology Differential Diagnosis: bcc vs scc-txpbx  Check Margins: No      I, [name], MD, have reviewed all documentation for this visit.  The documentation on 06/09/21 for the exam, diagnosis, procedures, and orders are all accurate and complete.

## 2021-06-19 ENCOUNTER — Encounter (INDEPENDENT_AMBULATORY_CARE_PROVIDER_SITE_OTHER): Payer: Medicare Other | Admitting: Ophthalmology

## 2021-06-19 ENCOUNTER — Other Ambulatory Visit: Payer: Self-pay

## 2021-06-19 DIAGNOSIS — H35033 Hypertensive retinopathy, bilateral: Secondary | ICD-10-CM | POA: Diagnosis not present

## 2021-06-19 DIAGNOSIS — H353112 Nonexudative age-related macular degeneration, right eye, intermediate dry stage: Secondary | ICD-10-CM | POA: Diagnosis not present

## 2021-06-19 DIAGNOSIS — H33301 Unspecified retinal break, right eye: Secondary | ICD-10-CM

## 2021-06-19 DIAGNOSIS — H353221 Exudative age-related macular degeneration, left eye, with active choroidal neovascularization: Secondary | ICD-10-CM

## 2021-06-19 DIAGNOSIS — H43813 Vitreous degeneration, bilateral: Secondary | ICD-10-CM

## 2021-06-19 DIAGNOSIS — I10 Essential (primary) hypertension: Secondary | ICD-10-CM | POA: Diagnosis not present

## 2021-07-17 ENCOUNTER — Other Ambulatory Visit: Payer: Self-pay

## 2021-07-17 ENCOUNTER — Encounter (INDEPENDENT_AMBULATORY_CARE_PROVIDER_SITE_OTHER): Payer: Medicare Other | Admitting: Ophthalmology

## 2021-07-17 DIAGNOSIS — H353112 Nonexudative age-related macular degeneration, right eye, intermediate dry stage: Secondary | ICD-10-CM | POA: Diagnosis not present

## 2021-07-17 DIAGNOSIS — I10 Essential (primary) hypertension: Secondary | ICD-10-CM | POA: Diagnosis not present

## 2021-07-17 DIAGNOSIS — H35033 Hypertensive retinopathy, bilateral: Secondary | ICD-10-CM

## 2021-07-17 DIAGNOSIS — H353221 Exudative age-related macular degeneration, left eye, with active choroidal neovascularization: Secondary | ICD-10-CM

## 2021-07-17 DIAGNOSIS — H33301 Unspecified retinal break, right eye: Secondary | ICD-10-CM

## 2021-07-17 DIAGNOSIS — H43813 Vitreous degeneration, bilateral: Secondary | ICD-10-CM

## 2021-08-14 ENCOUNTER — Other Ambulatory Visit: Payer: Self-pay

## 2021-08-14 ENCOUNTER — Encounter (INDEPENDENT_AMBULATORY_CARE_PROVIDER_SITE_OTHER): Payer: Medicare Other | Admitting: Ophthalmology

## 2021-08-14 DIAGNOSIS — H353112 Nonexudative age-related macular degeneration, right eye, intermediate dry stage: Secondary | ICD-10-CM

## 2021-08-14 DIAGNOSIS — H35033 Hypertensive retinopathy, bilateral: Secondary | ICD-10-CM | POA: Diagnosis not present

## 2021-08-14 DIAGNOSIS — H43813 Vitreous degeneration, bilateral: Secondary | ICD-10-CM

## 2021-08-14 DIAGNOSIS — H353221 Exudative age-related macular degeneration, left eye, with active choroidal neovascularization: Secondary | ICD-10-CM | POA: Diagnosis not present

## 2021-08-14 DIAGNOSIS — I10 Essential (primary) hypertension: Secondary | ICD-10-CM | POA: Diagnosis not present

## 2021-08-14 DIAGNOSIS — H33301 Unspecified retinal break, right eye: Secondary | ICD-10-CM | POA: Diagnosis not present

## 2021-08-30 DIAGNOSIS — H401123 Primary open-angle glaucoma, left eye, severe stage: Secondary | ICD-10-CM | POA: Diagnosis not present

## 2021-08-30 DIAGNOSIS — H401112 Primary open-angle glaucoma, right eye, moderate stage: Secondary | ICD-10-CM | POA: Diagnosis not present

## 2021-09-12 ENCOUNTER — Other Ambulatory Visit: Payer: Self-pay

## 2021-09-12 ENCOUNTER — Encounter (INDEPENDENT_AMBULATORY_CARE_PROVIDER_SITE_OTHER): Payer: Medicare Other | Admitting: Ophthalmology

## 2021-09-12 DIAGNOSIS — H43813 Vitreous degeneration, bilateral: Secondary | ICD-10-CM | POA: Diagnosis not present

## 2021-09-12 DIAGNOSIS — H353221 Exudative age-related macular degeneration, left eye, with active choroidal neovascularization: Secondary | ICD-10-CM | POA: Diagnosis not present

## 2021-09-12 DIAGNOSIS — H35033 Hypertensive retinopathy, bilateral: Secondary | ICD-10-CM | POA: Diagnosis not present

## 2021-09-12 DIAGNOSIS — H353112 Nonexudative age-related macular degeneration, right eye, intermediate dry stage: Secondary | ICD-10-CM

## 2021-09-12 DIAGNOSIS — I10 Essential (primary) hypertension: Secondary | ICD-10-CM | POA: Diagnosis not present

## 2021-09-12 DIAGNOSIS — H33301 Unspecified retinal break, right eye: Secondary | ICD-10-CM | POA: Diagnosis not present

## 2021-10-10 ENCOUNTER — Encounter (INDEPENDENT_AMBULATORY_CARE_PROVIDER_SITE_OTHER): Payer: Medicare Other | Admitting: Ophthalmology

## 2021-10-10 DIAGNOSIS — H353221 Exudative age-related macular degeneration, left eye, with active choroidal neovascularization: Secondary | ICD-10-CM

## 2021-10-10 DIAGNOSIS — H35033 Hypertensive retinopathy, bilateral: Secondary | ICD-10-CM | POA: Diagnosis not present

## 2021-10-10 DIAGNOSIS — H353112 Nonexudative age-related macular degeneration, right eye, intermediate dry stage: Secondary | ICD-10-CM | POA: Diagnosis not present

## 2021-10-10 DIAGNOSIS — I10 Essential (primary) hypertension: Secondary | ICD-10-CM | POA: Diagnosis not present

## 2021-10-10 DIAGNOSIS — H43813 Vitreous degeneration, bilateral: Secondary | ICD-10-CM

## 2021-11-07 ENCOUNTER — Encounter (INDEPENDENT_AMBULATORY_CARE_PROVIDER_SITE_OTHER): Payer: Medicare Other | Admitting: Ophthalmology

## 2021-11-07 DIAGNOSIS — H353112 Nonexudative age-related macular degeneration, right eye, intermediate dry stage: Secondary | ICD-10-CM

## 2021-11-07 DIAGNOSIS — H43813 Vitreous degeneration, bilateral: Secondary | ICD-10-CM | POA: Diagnosis not present

## 2021-11-07 DIAGNOSIS — H33301 Unspecified retinal break, right eye: Secondary | ICD-10-CM | POA: Diagnosis not present

## 2021-11-07 DIAGNOSIS — I10 Essential (primary) hypertension: Secondary | ICD-10-CM

## 2021-11-07 DIAGNOSIS — H353221 Exudative age-related macular degeneration, left eye, with active choroidal neovascularization: Secondary | ICD-10-CM

## 2021-11-07 DIAGNOSIS — H35033 Hypertensive retinopathy, bilateral: Secondary | ICD-10-CM

## 2021-12-06 ENCOUNTER — Encounter (INDEPENDENT_AMBULATORY_CARE_PROVIDER_SITE_OTHER): Payer: Medicare Other | Admitting: Ophthalmology

## 2021-12-06 DIAGNOSIS — H35033 Hypertensive retinopathy, bilateral: Secondary | ICD-10-CM

## 2021-12-06 DIAGNOSIS — H43813 Vitreous degeneration, bilateral: Secondary | ICD-10-CM | POA: Diagnosis not present

## 2021-12-06 DIAGNOSIS — H33301 Unspecified retinal break, right eye: Secondary | ICD-10-CM

## 2021-12-06 DIAGNOSIS — H353221 Exudative age-related macular degeneration, left eye, with active choroidal neovascularization: Secondary | ICD-10-CM | POA: Diagnosis not present

## 2021-12-06 DIAGNOSIS — H353112 Nonexudative age-related macular degeneration, right eye, intermediate dry stage: Secondary | ICD-10-CM | POA: Diagnosis not present

## 2021-12-06 DIAGNOSIS — I10 Essential (primary) hypertension: Secondary | ICD-10-CM

## 2021-12-10 ENCOUNTER — Encounter: Payer: Self-pay | Admitting: Dermatology

## 2021-12-10 ENCOUNTER — Ambulatory Visit: Payer: Medicare Other | Admitting: Dermatology

## 2021-12-10 DIAGNOSIS — Z85828 Personal history of other malignant neoplasm of skin: Secondary | ICD-10-CM

## 2021-12-10 DIAGNOSIS — L570 Actinic keratosis: Secondary | ICD-10-CM | POA: Diagnosis not present

## 2022-01-03 ENCOUNTER — Encounter (INDEPENDENT_AMBULATORY_CARE_PROVIDER_SITE_OTHER): Payer: Medicare Other | Admitting: Ophthalmology

## 2022-01-04 ENCOUNTER — Encounter: Payer: Self-pay | Admitting: Dermatology

## 2022-01-04 NOTE — Progress Notes (Signed)
   Follow-Up Visit   Subjective  Kenneth Pope is a 88 y.o. male who presents for the following: Follow-up (Left bridge of nose lesion he wants checked by Dr Denna Haggard, patient has personal history of bcc and scc).  Check new crust on nose plus sites of recent skin cancers face Location:  Duration:  Quality:  Associated Signs/Symptoms: Modifying Factors:  Severity:  Timing: Context:   Objective  Well appearing patient in no apparent distress; mood and affect are within normal limits. Left Nasal Sidewall Gritty 5 mm pink crust  Left Zygomatic Area, Right Zygomatic Area Sites of nonmelanoma skin cancers clear    A focused examination was performed including head and neck. Relevant physical exam findings are noted in the Assessment and Plan.   Assessment & Plan    AK (actinic keratosis) Left Nasal Sidewall  Destruction of lesion - Left Nasal Sidewall Complexity: simple   Destruction method: cryotherapy   Informed consent: discussed and consent obtained   Timeout:  patient name, date of birth, surgical site, and procedure verified Lesion destroyed using liquid nitrogen: Yes   Cryotherapy cycles:  3 Outcome: patient tolerated procedure well with no complications   Post-procedure details: wound care instructions given    Personal history of skin cancer (2) Left Zygomatic Area; Right Zygomatic Area  Check if there is clinical change      I, [name], MD, have reviewed all documentation for this visit.  The documentation on 01/04/22 for the exam, diagnosis, procedures, and orders are all accurate and complete.

## 2022-01-07 ENCOUNTER — Encounter (INDEPENDENT_AMBULATORY_CARE_PROVIDER_SITE_OTHER): Payer: Medicare Other | Admitting: Ophthalmology

## 2022-01-07 DIAGNOSIS — I10 Essential (primary) hypertension: Secondary | ICD-10-CM

## 2022-01-07 DIAGNOSIS — H35033 Hypertensive retinopathy, bilateral: Secondary | ICD-10-CM

## 2022-01-07 DIAGNOSIS — H33301 Unspecified retinal break, right eye: Secondary | ICD-10-CM | POA: Diagnosis not present

## 2022-01-07 DIAGNOSIS — H43813 Vitreous degeneration, bilateral: Secondary | ICD-10-CM

## 2022-01-07 DIAGNOSIS — H353112 Nonexudative age-related macular degeneration, right eye, intermediate dry stage: Secondary | ICD-10-CM

## 2022-01-07 DIAGNOSIS — H353221 Exudative age-related macular degeneration, left eye, with active choroidal neovascularization: Secondary | ICD-10-CM

## 2022-01-29 ENCOUNTER — Other Ambulatory Visit: Payer: Self-pay | Admitting: Cardiology

## 2022-02-04 ENCOUNTER — Encounter (INDEPENDENT_AMBULATORY_CARE_PROVIDER_SITE_OTHER): Payer: Medicare Other | Admitting: Ophthalmology

## 2022-02-04 DIAGNOSIS — H35033 Hypertensive retinopathy, bilateral: Secondary | ICD-10-CM

## 2022-02-04 DIAGNOSIS — H33301 Unspecified retinal break, right eye: Secondary | ICD-10-CM

## 2022-02-04 DIAGNOSIS — I10 Essential (primary) hypertension: Secondary | ICD-10-CM

## 2022-02-04 DIAGNOSIS — H353221 Exudative age-related macular degeneration, left eye, with active choroidal neovascularization: Secondary | ICD-10-CM

## 2022-02-04 DIAGNOSIS — H43813 Vitreous degeneration, bilateral: Secondary | ICD-10-CM | POA: Diagnosis not present

## 2022-02-04 DIAGNOSIS — H353112 Nonexudative age-related macular degeneration, right eye, intermediate dry stage: Secondary | ICD-10-CM

## 2022-02-13 NOTE — Progress Notes (Unsigned)
Cardiology Office Note   Date:  02/14/2022   ID:  Kenneth Pope 1934/05/07, MRN [id number]  PCP:  Reynold Bowen, MD  Cardiologist:   Minus Breeding, MD   Chief Complaint  Patient presents with   Coronary Artery Disease       History of Present Illness: Kenneth Pope is a 88 y.o. male who presents for follow-up of coronary disease.  He was admitted in 2019 with syncope.  He had an elevated troponin.  He eventually had a cardiac catheterization demonstrating 50% stenosis in the LAD.  He had 85% stenosis in the right coronary artery.  He had DES to the RCA.  He was noted also to have probable coronary spasm in the LAD.  His syncope was thought to be related possibly to an arrhythmia and he was started on a beta-blocker.  ACE inhibitor was stopped.  He did have an event monitor and no arrhythmia was noted.    Since he was last seen he has done well.  The patient denies any new symptoms such as chest discomfort, neck or arm discomfort. There has been no new shortness of breath, PND or orthopnea. There have been no reported palpitations, presyncope or syncope.   Past Medical History:  Diagnosis Date   Arthritis    BCC (basal cell carcinoma of skin) 12/08/2013   Bridge of Nose and Right Ear   CAD (coronary artery disease) 05/04/2018   a. 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   Mid Forehead    Superficial basal cell carcinoma (BCC) 11/30/2019   Dorsum of Nose    Past Surgical History:  Procedure Laterality Date   CARDIAC CATHETERIZATION     CORONARY STENT INTERVENTION N/A 05/05/2018   Procedure: CORONARY STENT INTERVENTION;  Surgeon: Troy Sine, MD;  Location: [facility];  Service: Cardiovascular;  Laterality: N/A;   HERNIA REPAIR     LEFT HEART CATH AND CORONARY ANGIOGRAPHY N/A 05/05/2018   Procedure: LEFT HEART CATH AND CORONARY ANGIOGRAPHY;  Surgeon: Troy Sine, MD;  Location: [facility];  Service: Cardiovascular;  Laterality: N/A;     Current Outpatient Medications  Medication Sig Dispense Refill   amLODipine (NORVASC) 10 MG tablet TAKE 1 TABLET BY MOUTH EVERY DAY 90 tablet 2   aspirin EC 81 MG tablet Take 81 mg by mouth daily.     atorvastatin (LIPITOR) 80 MG tablet TAKE 1 TABLET(80 MG) BY MOUTH DAILY AT 6 PM 90 tablet 3   carvedilol (COREG) 3.125 MG tablet Take 1 tablet (3.125 mg total) by mouth 2 (two) times daily with a meal. SCHEDULE OFFICE VISIT FOR FUTURE REFILLS. 60 tablet 0   dorzolamide-timolol (COSOPT) 22.3-6.8 MG/ML ophthalmic solution 1 drop 2 (two) times daily.     Multiple Vitamin (MULTIVITAMIN WITH  MINERALS) TABS tablet Take 1 tablet by mouth daily.     Multiple Vitamins-Minerals (PRESERVISION AREDS 2) CAPS Take 1 capsule by mouth 2 (two) times daily.      nitroGLYCERIN (NITROSTAT) 0.4 MG SL tablet Place 1 tablet (0.4 mg total) under the tongue every 5 (five) minutes as needed for chest pain. 25 tablet 11   omega-3 acid ethyl esters (LOVAZA) 1 g capsule Take 1 g by mouth 2 (two) times daily.     No current facility-administered medications for this visit.    Allergies:   Patient has no known allergies.     ROS:  Please see the history of present illness.   Otherwise, review of systems are positive for none.   All other systems are reviewed and negative.    PHYSICAL EXAM: VS:  BP 131/83   Pulse (!) 51   Ht '5\' 8"'$  (1.727 m)    Wt 170 lb 6.4 oz (77.3 kg)   SpO2 96%   BMI 25.91 kg/m  , BMI Body mass index is 25.91 kg/m. GENERAL:  Well appearing NECK:  No jugular venous distention, waveform within normal limits, carotid upstroke brisk and symmetric, no bruits, no thyromegaly LUNGS:  Clear to auscultation bilaterally CHEST:  Unremarkable HEART:  PMI not displaced or sustained,S1 and S2 within normal limits, no S3, no S4, no clicks, no rubs, no murmurs ABD:  Flat, positive bowel sounds normal in frequency in pitch, no bruits, no rebound, no guarding, no midline pulsatile mass, no hepatomegaly, no splenomegaly EXT:  2 plus pulses throughout, no edema, no cyanosis no clubbing   EKG:  EKG is  ordered today. Sinus bradycardia, rate 51, axis within normal limits, intervals within normal limits, no acute ST-T wave changes.  Recent Labs: No results found for requested labs within last 365 days.    Wt Readings from Last 3 Encounters:  02/14/22 170 lb 6.4 oz (77.3 kg)  02/05/21 170 lb 12.8 oz (77.5 kg)  01/17/20 169 lb 9.6 oz (76.9 kg)      Other studies Reviewed: Additional studies/ records that were reviewed today include:   Labs Review of the above records demonstrates:  Please see elsewhere in the note.     ASSESSMENT AND PLAN:  CAD:   The patient has no new sypmtoms.  No further cardiovascular testing is indicated.  We will continue with aggressive risk reduction and meds as listed.  HTN:   Blood pressure is at target.  No change in therapy.   DYSLIPIDEMIA:   LDL was 51.  No change in therapy.    Current medicines are reviewed at length with the patient today.  The patient does not have concerns regarding medicines.  The following changes have been made:  None  Labs/ tests ordered today include:  None  Orders Placed This Encounter  Procedures   EKG 12-Lead      Disposition:   FU with me in 12 months.    Signed, Minus Breeding, MD  02/14/2022 9:57 AM    [facility]

## 2022-02-14 ENCOUNTER — Ambulatory Visit: Payer: Medicare Other | Attending: Cardiology | Admitting: Cardiology

## 2022-02-14 ENCOUNTER — Encounter: Payer: Self-pay | Admitting: Cardiology

## 2022-02-14 VITALS — BP 131/83 | HR 51 | Ht 68.0 in | Wt 170.4 lb

## 2022-02-14 DIAGNOSIS — I2510 Atherosclerotic heart disease of native coronary artery without angina pectoris: Secondary | ICD-10-CM

## 2022-02-14 DIAGNOSIS — E785 Hyperlipidemia, unspecified: Secondary | ICD-10-CM

## 2022-02-14 DIAGNOSIS — I10 Essential (primary) hypertension: Secondary | ICD-10-CM

## 2022-02-14 NOTE — Patient Instructions (Signed)
   Follow-Up: At [facility], you and your health needs are our priority.  As part of our continuing mission to provide you with exceptional heart care, we have created designated Provider Care Teams.  These Care Teams include your primary Cardiologist (physician) and Advanced Practice Providers (APPs -  Physician Assistants and Nurse Practitioners) who all work together to provide you with the care you need, when you need it.  We recommend signing up for the patient portal called "MyChart".  Sign up information is provided on this After Visit Summary.  MyChart is used to connect with patients for Virtual Visits (Telemedicine).  Patients are able to view lab/test results, encounter notes, upcoming appointments, etc.  Non-urgent messages can be sent to your provider as well.   To learn more about what you can do with MyChart, go to [email or web address].    Your next appointment:   12 month(s)  The format for your next appointment:   In Person  Provider:   James Hochrein, MD     

## 2022-02-21 DIAGNOSIS — I10 Essential (primary) hypertension: Secondary | ICD-10-CM | POA: Diagnosis not present

## 2022-02-21 DIAGNOSIS — R7301 Impaired fasting glucose: Secondary | ICD-10-CM | POA: Diagnosis not present

## 2022-02-21 DIAGNOSIS — E785 Hyperlipidemia, unspecified: Secondary | ICD-10-CM | POA: Diagnosis not present

## 2022-02-21 DIAGNOSIS — Z125 Encounter for screening for malignant neoplasm of prostate: Secondary | ICD-10-CM | POA: Diagnosis not present

## 2022-02-26 DIAGNOSIS — Z0000 Encounter for general adult medical examination without abnormal findings: Secondary | ICD-10-CM | POA: Diagnosis not present

## 2022-02-26 DIAGNOSIS — R82998 Other abnormal findings in urine: Secondary | ICD-10-CM | POA: Diagnosis not present

## 2022-02-26 DIAGNOSIS — R7301 Impaired fasting glucose: Secondary | ICD-10-CM | POA: Diagnosis not present

## 2022-02-26 DIAGNOSIS — I10 Essential (primary) hypertension: Secondary | ICD-10-CM | POA: Diagnosis not present

## 2022-02-26 DIAGNOSIS — E785 Hyperlipidemia, unspecified: Secondary | ICD-10-CM | POA: Diagnosis not present

## 2022-02-27 ENCOUNTER — Other Ambulatory Visit: Payer: Self-pay | Admitting: Cardiology

## 2022-02-27 DIAGNOSIS — Z961 Presence of intraocular lens: Secondary | ICD-10-CM | POA: Diagnosis not present

## 2022-02-27 DIAGNOSIS — H353221 Exudative age-related macular degeneration, left eye, with active choroidal neovascularization: Secondary | ICD-10-CM | POA: Diagnosis not present

## 2022-02-27 DIAGNOSIS — H1045 Other chronic allergic conjunctivitis: Secondary | ICD-10-CM | POA: Diagnosis not present

## 2022-02-27 DIAGNOSIS — H401112 Primary open-angle glaucoma, right eye, moderate stage: Secondary | ICD-10-CM | POA: Diagnosis not present

## 2022-03-04 ENCOUNTER — Encounter (INDEPENDENT_AMBULATORY_CARE_PROVIDER_SITE_OTHER): Payer: Medicare Other | Admitting: Ophthalmology

## 2022-03-04 DIAGNOSIS — H353112 Nonexudative age-related macular degeneration, right eye, intermediate dry stage: Secondary | ICD-10-CM

## 2022-03-04 DIAGNOSIS — H35033 Hypertensive retinopathy, bilateral: Secondary | ICD-10-CM

## 2022-03-04 DIAGNOSIS — H43813 Vitreous degeneration, bilateral: Secondary | ICD-10-CM

## 2022-03-04 DIAGNOSIS — H33301 Unspecified retinal break, right eye: Secondary | ICD-10-CM

## 2022-03-04 DIAGNOSIS — I10 Essential (primary) hypertension: Secondary | ICD-10-CM

## 2022-03-04 DIAGNOSIS — H353221 Exudative age-related macular degeneration, left eye, with active choroidal neovascularization: Secondary | ICD-10-CM | POA: Diagnosis not present

## 2022-04-01 ENCOUNTER — Encounter (INDEPENDENT_AMBULATORY_CARE_PROVIDER_SITE_OTHER): Payer: Medicare Other | Admitting: Ophthalmology

## 2022-04-01 DIAGNOSIS — H353112 Nonexudative age-related macular degeneration, right eye, intermediate dry stage: Secondary | ICD-10-CM | POA: Diagnosis not present

## 2022-04-01 DIAGNOSIS — H33301 Unspecified retinal break, right eye: Secondary | ICD-10-CM

## 2022-04-01 DIAGNOSIS — I10 Essential (primary) hypertension: Secondary | ICD-10-CM | POA: Diagnosis not present

## 2022-04-01 DIAGNOSIS — H353221 Exudative age-related macular degeneration, left eye, with active choroidal neovascularization: Secondary | ICD-10-CM | POA: Diagnosis not present

## 2022-04-01 DIAGNOSIS — H43813 Vitreous degeneration, bilateral: Secondary | ICD-10-CM

## 2022-04-01 DIAGNOSIS — H35033 Hypertensive retinopathy, bilateral: Secondary | ICD-10-CM | POA: Diagnosis not present

## 2022-04-26 ENCOUNTER — Other Ambulatory Visit: Payer: Self-pay | Admitting: Cardiology

## 2022-04-26 DIAGNOSIS — E785 Hyperlipidemia, unspecified: Secondary | ICD-10-CM

## 2022-04-29 ENCOUNTER — Encounter (INDEPENDENT_AMBULATORY_CARE_PROVIDER_SITE_OTHER): Payer: Medicare Other | Admitting: Ophthalmology

## 2022-04-29 DIAGNOSIS — H33301 Unspecified retinal break, right eye: Secondary | ICD-10-CM

## 2022-04-29 DIAGNOSIS — I10 Essential (primary) hypertension: Secondary | ICD-10-CM

## 2022-04-29 DIAGNOSIS — H353221 Exudative age-related macular degeneration, left eye, with active choroidal neovascularization: Secondary | ICD-10-CM

## 2022-04-29 DIAGNOSIS — H353112 Nonexudative age-related macular degeneration, right eye, intermediate dry stage: Secondary | ICD-10-CM | POA: Diagnosis not present

## 2022-04-29 DIAGNOSIS — H35033 Hypertensive retinopathy, bilateral: Secondary | ICD-10-CM | POA: Diagnosis not present

## 2022-04-29 DIAGNOSIS — H43813 Vitreous degeneration, bilateral: Secondary | ICD-10-CM

## 2022-05-27 ENCOUNTER — Encounter (INDEPENDENT_AMBULATORY_CARE_PROVIDER_SITE_OTHER): Payer: Medicare Other | Admitting: Ophthalmology

## 2022-05-27 DIAGNOSIS — H33301 Unspecified retinal break, right eye: Secondary | ICD-10-CM

## 2022-05-27 DIAGNOSIS — H353221 Exudative age-related macular degeneration, left eye, with active choroidal neovascularization: Secondary | ICD-10-CM

## 2022-05-27 DIAGNOSIS — H35033 Hypertensive retinopathy, bilateral: Secondary | ICD-10-CM | POA: Diagnosis not present

## 2022-05-27 DIAGNOSIS — H353112 Nonexudative age-related macular degeneration, right eye, intermediate dry stage: Secondary | ICD-10-CM | POA: Diagnosis not present

## 2022-05-27 DIAGNOSIS — I10 Essential (primary) hypertension: Secondary | ICD-10-CM | POA: Diagnosis not present

## 2022-05-27 DIAGNOSIS — H43813 Vitreous degeneration, bilateral: Secondary | ICD-10-CM

## 2022-06-24 ENCOUNTER — Encounter (INDEPENDENT_AMBULATORY_CARE_PROVIDER_SITE_OTHER): Payer: Medicare Other | Admitting: Ophthalmology

## 2022-06-24 DIAGNOSIS — H43813 Vitreous degeneration, bilateral: Secondary | ICD-10-CM | POA: Diagnosis not present

## 2022-06-24 DIAGNOSIS — H353112 Nonexudative age-related macular degeneration, right eye, intermediate dry stage: Secondary | ICD-10-CM

## 2022-06-24 DIAGNOSIS — I10 Essential (primary) hypertension: Secondary | ICD-10-CM | POA: Diagnosis not present

## 2022-06-24 DIAGNOSIS — H35033 Hypertensive retinopathy, bilateral: Secondary | ICD-10-CM | POA: Diagnosis not present

## 2022-06-24 DIAGNOSIS — H353221 Exudative age-related macular degeneration, left eye, with active choroidal neovascularization: Secondary | ICD-10-CM

## 2022-07-22 ENCOUNTER — Encounter (INDEPENDENT_AMBULATORY_CARE_PROVIDER_SITE_OTHER): Payer: Medicare Other | Admitting: Ophthalmology

## 2022-07-22 DIAGNOSIS — H353221 Exudative age-related macular degeneration, left eye, with active choroidal neovascularization: Secondary | ICD-10-CM | POA: Diagnosis not present

## 2022-07-22 DIAGNOSIS — H43813 Vitreous degeneration, bilateral: Secondary | ICD-10-CM

## 2022-07-22 DIAGNOSIS — I10 Essential (primary) hypertension: Secondary | ICD-10-CM

## 2022-07-22 DIAGNOSIS — H353112 Nonexudative age-related macular degeneration, right eye, intermediate dry stage: Secondary | ICD-10-CM | POA: Diagnosis not present

## 2022-07-22 DIAGNOSIS — H35033 Hypertensive retinopathy, bilateral: Secondary | ICD-10-CM

## 2022-07-29 DIAGNOSIS — L821 Other seborrheic keratosis: Secondary | ICD-10-CM | POA: Diagnosis not present

## 2022-07-29 DIAGNOSIS — C44399 Other specified malignant neoplasm of skin of other parts of face: Secondary | ICD-10-CM | POA: Diagnosis not present

## 2022-07-29 DIAGNOSIS — L579 Skin changes due to chronic exposure to nonionizing radiation, unspecified: Secondary | ICD-10-CM | POA: Diagnosis not present

## 2022-07-29 DIAGNOSIS — L814 Other melanin hyperpigmentation: Secondary | ICD-10-CM | POA: Diagnosis not present

## 2022-07-29 DIAGNOSIS — L570 Actinic keratosis: Secondary | ICD-10-CM | POA: Diagnosis not present

## 2022-08-19 ENCOUNTER — Encounter (INDEPENDENT_AMBULATORY_CARE_PROVIDER_SITE_OTHER): Payer: Medicare Other | Admitting: Ophthalmology

## 2022-08-19 DIAGNOSIS — H353221 Exudative age-related macular degeneration, left eye, with active choroidal neovascularization: Secondary | ICD-10-CM | POA: Diagnosis not present

## 2022-08-19 DIAGNOSIS — H35033 Hypertensive retinopathy, bilateral: Secondary | ICD-10-CM | POA: Diagnosis not present

## 2022-08-19 DIAGNOSIS — H353112 Nonexudative age-related macular degeneration, right eye, intermediate dry stage: Secondary | ICD-10-CM

## 2022-08-19 DIAGNOSIS — I10 Essential (primary) hypertension: Secondary | ICD-10-CM

## 2022-08-19 DIAGNOSIS — H43813 Vitreous degeneration, bilateral: Secondary | ICD-10-CM

## 2022-08-27 DIAGNOSIS — H401123 Primary open-angle glaucoma, left eye, severe stage: Secondary | ICD-10-CM | POA: Diagnosis not present

## 2022-08-27 DIAGNOSIS — H401111 Primary open-angle glaucoma, right eye, mild stage: Secondary | ICD-10-CM | POA: Diagnosis not present

## 2022-09-16 ENCOUNTER — Encounter (INDEPENDENT_AMBULATORY_CARE_PROVIDER_SITE_OTHER): Payer: Medicare Other | Admitting: Ophthalmology

## 2022-09-16 DIAGNOSIS — H353112 Nonexudative age-related macular degeneration, right eye, intermediate dry stage: Secondary | ICD-10-CM | POA: Diagnosis not present

## 2022-09-16 DIAGNOSIS — H353221 Exudative age-related macular degeneration, left eye, with active choroidal neovascularization: Secondary | ICD-10-CM | POA: Diagnosis not present

## 2022-09-16 DIAGNOSIS — H35033 Hypertensive retinopathy, bilateral: Secondary | ICD-10-CM | POA: Diagnosis not present

## 2022-09-16 DIAGNOSIS — H43813 Vitreous degeneration, bilateral: Secondary | ICD-10-CM

## 2022-09-16 DIAGNOSIS — I10 Essential (primary) hypertension: Secondary | ICD-10-CM

## 2022-10-14 ENCOUNTER — Encounter (INDEPENDENT_AMBULATORY_CARE_PROVIDER_SITE_OTHER): Payer: Medicare Other | Admitting: Ophthalmology

## 2022-10-14 DIAGNOSIS — H353221 Exudative age-related macular degeneration, left eye, with active choroidal neovascularization: Secondary | ICD-10-CM | POA: Diagnosis not present

## 2022-10-14 DIAGNOSIS — H35033 Hypertensive retinopathy, bilateral: Secondary | ICD-10-CM | POA: Diagnosis not present

## 2022-10-14 DIAGNOSIS — I10 Essential (primary) hypertension: Secondary | ICD-10-CM | POA: Diagnosis not present

## 2022-10-14 DIAGNOSIS — H43813 Vitreous degeneration, bilateral: Secondary | ICD-10-CM

## 2022-10-14 DIAGNOSIS — H353112 Nonexudative age-related macular degeneration, right eye, intermediate dry stage: Secondary | ICD-10-CM

## 2022-10-30 DIAGNOSIS — R3914 Feeling of incomplete bladder emptying: Secondary | ICD-10-CM | POA: Diagnosis not present

## 2022-10-30 DIAGNOSIS — R310 Gross hematuria: Secondary | ICD-10-CM | POA: Diagnosis not present

## 2022-10-30 DIAGNOSIS — R972 Elevated prostate specific antigen [PSA]: Secondary | ICD-10-CM | POA: Diagnosis not present

## 2022-10-30 DIAGNOSIS — N401 Enlarged prostate with lower urinary tract symptoms: Secondary | ICD-10-CM | POA: Diagnosis not present

## 2022-11-12 ENCOUNTER — Encounter (INDEPENDENT_AMBULATORY_CARE_PROVIDER_SITE_OTHER): Payer: Medicare Other | Admitting: Ophthalmology

## 2022-11-12 DIAGNOSIS — H43813 Vitreous degeneration, bilateral: Secondary | ICD-10-CM

## 2022-11-12 DIAGNOSIS — H353221 Exudative age-related macular degeneration, left eye, with active choroidal neovascularization: Secondary | ICD-10-CM

## 2022-11-12 DIAGNOSIS — H35033 Hypertensive retinopathy, bilateral: Secondary | ICD-10-CM | POA: Diagnosis not present

## 2022-11-12 DIAGNOSIS — H353112 Nonexudative age-related macular degeneration, right eye, intermediate dry stage: Secondary | ICD-10-CM

## 2022-11-12 DIAGNOSIS — I10 Essential (primary) hypertension: Secondary | ICD-10-CM

## 2022-11-26 DIAGNOSIS — N200 Calculus of kidney: Secondary | ICD-10-CM | POA: Diagnosis not present

## 2022-11-26 DIAGNOSIS — N3289 Other specified disorders of bladder: Secondary | ICD-10-CM | POA: Diagnosis not present

## 2022-11-26 DIAGNOSIS — R310 Gross hematuria: Secondary | ICD-10-CM | POA: Diagnosis not present

## 2022-12-03 DIAGNOSIS — I25729 Atherosclerosis of autologous artery coronary artery bypass graft(s) with unspecified angina pectoris: Secondary | ICD-10-CM | POA: Diagnosis not present

## 2022-12-04 ENCOUNTER — Emergency Department (HOSPITAL_COMMUNITY): Payer: Medicare Other

## 2022-12-04 ENCOUNTER — Encounter (HOSPITAL_COMMUNITY): Payer: Self-pay | Admitting: Emergency Medicine

## 2022-12-04 ENCOUNTER — Emergency Department (HOSPITAL_COMMUNITY)
Admission: EM | Admit: 2022-12-04 | Discharge: 2022-12-04 | Disposition: A | Discharge status: Home / Self Care | Payer: Medicare Other | Attending: Emergency Medicine | Admitting: Emergency Medicine

## 2022-12-04 ENCOUNTER — Other Ambulatory Visit: Payer: Self-pay

## 2022-12-04 DIAGNOSIS — Z7982 Long term (current) use of aspirin: Secondary | ICD-10-CM | POA: Insufficient documentation

## 2022-12-04 DIAGNOSIS — R1013 Epigastric pain: Secondary | ICD-10-CM | POA: Diagnosis not present

## 2022-12-04 DIAGNOSIS — Z79899 Other long term (current) drug therapy: Secondary | ICD-10-CM | POA: Diagnosis not present

## 2022-12-04 DIAGNOSIS — I10 Essential (primary) hypertension: Secondary | ICD-10-CM | POA: Diagnosis not present

## 2022-12-04 DIAGNOSIS — K2900 Acute gastritis without bleeding: Secondary | ICD-10-CM | POA: Diagnosis not present

## 2022-12-04 DIAGNOSIS — R079 Chest pain, unspecified: Secondary | ICD-10-CM | POA: Diagnosis not present

## 2022-12-04 DIAGNOSIS — Z85828 Personal history of other malignant neoplasm of skin: Secondary | ICD-10-CM | POA: Diagnosis not present

## 2022-12-04 DIAGNOSIS — I2510 Atherosclerotic heart disease of native coronary artery without angina pectoris: Secondary | ICD-10-CM | POA: Diagnosis not present

## 2022-12-04 DIAGNOSIS — R001 Bradycardia, unspecified: Secondary | ICD-10-CM | POA: Diagnosis not present

## 2022-12-04 DIAGNOSIS — I700 Atherosclerosis of aorta: Secondary | ICD-10-CM | POA: Diagnosis not present

## 2022-12-04 DIAGNOSIS — R0789 Other chest pain: Secondary | ICD-10-CM

## 2022-12-04 LAB — COMPREHENSIVE METABOLIC PANEL
ALT: 26 U/L (ref 0–44)
AST: 33 U/L (ref 15–41)
Albumin: 3.7 g/dL (ref 3.5–5.0)
Alkaline Phosphatase: 54 U/L (ref 38–126)
Anion gap: 11 (ref 5–15)
BUN: 12 mg/dL (ref 8–23)
CO2: 24 mmol/L (ref 22–32)
Calcium: 9 mg/dL (ref 8.9–10.3)
Chloride: 101 mmol/L (ref 98–111)
Creatinine, Ser: 0.83 mg/dL (ref 0.61–1.24)
GFR, Estimated: >60 mL/min (ref >60)
Glucose, Bld: 138 mg/dL — ABNORMAL HIGH (ref 70–99)
Potassium: 3.4 mmol/L — ABNORMAL LOW (ref 3.5–5.1)
Sodium: 136 mmol/L (ref 135–145)
Total Bilirubin: 1.7 mg/dL — ABNORMAL HIGH (ref 0.3–1.2)
Total Protein: 7.3 g/dL (ref 6.5–8.1)

## 2022-12-04 LAB — CBC
HCT: 44.5 % (ref 39.0–52.0)
Hemoglobin: 15.3 g/dL (ref 13.0–17.0)
MCH: 33.5 pg (ref 26.0–34.0)
MCHC: 34.4 g/dL (ref 30.0–36.0)
MCV: 97.4 fL (ref 80.0–100.0)
Platelets: 206 10*3/uL (ref 150–400)
RBC: 4.57 MIL/uL (ref 4.22–5.81)
RDW: 11.8 % (ref 11.5–15.5)
WBC: 10.2 10*3/uL (ref 4.0–10.5)
nRBC: 0 % (ref 0.0–0.2)

## 2022-12-04 LAB — TROPONIN I (HIGH SENSITIVITY)
Troponin I (High Sensitivity): 7 ng/L (ref <18)
Troponin I (High Sensitivity): 7 ng/L (ref <18)

## 2022-12-04 LAB — LIPASE, BLOOD: Lipase: 37 U/L (ref 11–51)

## 2022-12-04 MED ORDER — HYDROMORPHONE HCL 1 MG/ML IJ SOLN
0.2500 mg | Freq: Once | INTRAMUSCULAR | Status: AC
  Administered 2022-12-04: 0.25 mg via INTRAVENOUS
  Filled 2022-12-04: qty 1

## 2022-12-04 MED ORDER — ALUM & MAG HYDROXIDE-SIMETH 200-200-20 MG/5ML PO SUSP
30.0000 mL | Freq: Once | ORAL | Status: AC
  Administered 2022-12-04: 30 mL via ORAL
  Filled 2022-12-04: qty 30

## 2022-12-04 MED ORDER — ACETAMINOPHEN 500 MG PO TABS
1000.0000 mg | ORAL_TABLET | Freq: Once | ORAL | Status: AC
  Administered 2022-12-04: 1000 mg via ORAL
  Filled 2022-12-04: qty 2

## 2022-12-04 MED ORDER — IOHEXOL 350 MG/ML SOLN
100.0000 mL | Freq: Once | INTRAVENOUS | Status: AC | PRN
  Administered 2022-12-04: 100 mL via INTRAVENOUS

## 2022-12-04 MED ORDER — PANTOPRAZOLE SODIUM 40 MG IV SOLR
40.0000 mg | Freq: Once | INTRAVENOUS | Status: AC
  Administered 2022-12-04: 40 mg via INTRAVENOUS
  Filled 2022-12-04: qty 10

## 2022-12-04 MED ORDER — LIDOCAINE VISCOUS HCL 2 % MT SOLN
15.0000 mL | Freq: Once | OROMUCOSAL | Status: AC
  Administered 2022-12-04: 15 mL via ORAL
  Filled 2022-12-04: qty 15

## 2022-12-04 MED ORDER — FAMOTIDINE 20 MG PO TABS: 20.0000 mg | ORAL_TABLET | Freq: Two times a day (BID) | ORAL | 0 refills | Status: DC

## 2022-12-04 NOTE — ED Triage Notes (Signed)
Patient BIB EMS from home with c/o chest pain that started 1300, x3nitro without relief pain worsen. Patient holding ABD at time c/o chest pain. Stent placed 2019. 184/100. 20gLAC. ASA 324mg . Denies SOB.

## 2022-12-04 NOTE — ED Provider Notes (Incomplete)
[place] EMERGENCY DEPARTMENT AT [facility] Provider Note   CSN: [id number] Arrival date & time: 12/04/22  1804     History {Add pertinent medical, surgical, social history, OB history to HPI:1} Chief Complaint  Patient presents with  . Chest Pain    Kenneth Pope is a 89 y.o. male with *** presents with ***.    Patient BIB EMS from home with c/o chest pain that started 1300, x3nitro without relief pain worsen. Patient holding ABD at time c/o chest pain. Stent placed 2019. 184/100. 20gLAC. ASA 324mg . Denies SOB.      Chest Pain      Home Medications Prior to Admission medications   Medication Sig Start Date End Date Taking? Authorizing Provider  amLODipine (NORVASC) 10 MG tablet TAKE 1 TABLET BY MOUTH EVERY DAY 05/12/19   Berton Bon, NP  aspirin EC 81 MG tablet Take 81 mg by mouth daily.    [provider]  atorvastatin (LIPITOR) 80 MG tablet TAKE 1 TABLET(80 MG) BY MOUTH DAILY AT 6 PM 04/26/22   Rollene Rotunda, MD  carvedilol (COREG) 3.125 MG tablet TAKE 1 TABLET BY MOUTH TWICE DAILY WITH A MEAL, SCHEDULE OFFICE VISIT FOR FUTURE REFILLS 02/27/22   Rollene Rotunda, MD  dorzolamide-timolol (COSOPT) 22.3-6.8 MG/ML ophthalmic solution 1 drop 2 (two) times daily.    [provider]  Multiple Vitamin (MULTIVITAMIN WITH MINERALS) TABS tablet Take 1 tablet by mouth daily.    [provider]  Multiple Vitamins-Minerals (PRESERVISION AREDS 2) CAPS Take 1 capsule by mouth 2 (two) times daily.     [provider]  nitroGLYCERIN (NITROSTAT) 0.4 MG SL tablet Place 1 tablet (0.4 mg total) under the tongue every 5 (five) minutes as needed for chest pain. 03/07/20   Rollene Rotunda, MD  omega-3 acid ethyl esters (LOVAZA) 1 g capsule Take 1 g by mouth 2 (two) times daily.    [provider]      Allergies    Patient has no known allergies.    Review of Systems   Review of Systems  Cardiovascular:  Positive for chest  pain.   A 10 point review of systems was performed and is negative unless otherwise reported in HPI.  Physical Exam Updated Vital Signs BP (!) 153/89   Pulse 73   Temp (!) 97.5 F (36.4 C) (Oral)   Resp 17   SpO2 97%  Physical Exam General: Normal appearing {Desc; male/male:11659}, lying in bed.  HEENT: PERRLA, Sclera anicteric, MMM, trachea midline.  Cardiology: RRR, no murmurs/rubs/gallops. BL radial and DP pulses equal bilaterally.  Resp: Normal respiratory rate and effort. CTAB, no wheezes, rhonchi, crackles.  Abd: Soft, non-tender, non-distended. No rebound tenderness or guarding.  GU: Deferred. MSK: No peripheral edema or signs of trauma. Extremities without deformity or TTP. No cyanosis or clubbing. Skin: warm, dry. No rashes or lesions. Back: No CVA tenderness Neuro: A&Ox4, CNs II-XII grossly intact. MAEs. Sensation grossly intact.  Psych: Normal mood and affect.   ED Results / Procedures / Treatments   Labs (all labs ordered are listed, but only abnormal results are displayed) Labs Reviewed - No data to display  EKG None  Radiology No results found.  Procedures Procedures  {Document cardiac monitor, telemetry assessment procedure when appropriate:1}  Medications Ordered in ED Medications - No data to display  ED Course/ Medical Decision Making/ A&P  Medical Decision Making Amount and/or Complexity of Data Reviewed Labs: ordered. Decision-making details documented in ED Course. Radiology: ordered. Decision-making details documented in ED Course.  Risk OTC drugs. Prescription drug management.    This patient presents to the ED for concern of ***, this involves an extensive number of treatment options, and is a complaint that carries with it a high risk of complications and morbidity.  I considered the following differential and admission for this acute, potentially life threatening condition.   MDM:    ***  Clinical  Course as of 12/04/22 2328  Wed Dec 04, 2022  1939 DG Chest [place] 1 View Left lung opacity concerning for pneumonia with possible small left pleural effusion. Followup PA and lateral chest X-ray is recommended in 3-4 weeks following trial of antibiotic therapy to ensure resolution and exclude underlying malignancy.   [HN]  2020 CBC wnl [HN]  2138 Troponin I (High Sensitivity): 7 neg [HN]  2138 Comprehensive metabolic panel(!) Unremarkable in the context of this patient's presentation  [HN]  2221 Lipase: 37 wnl [HN]  2221 CT Angio Chest/Abd/Pel for Dissection W and/or Wo Contrast 1. No evidence of aortic aneurysm or dissection. 2. No acute abnormality in the chest, abdomen, or pelvis. 3. Chronic interstitial lung disease. 4. Prostatomegaly.   [HN]  2221 Neg repeat trop, reassuring CT scan. Mildly elevated bilirubin but no RUQ TTP. Will trial protonix/maalox for GERD/gastritis.  [HN]  2323 Patient's pain improving after protonix, maalox/lidocaine. He states that he did eat some spicy soup earlier before his pain started which is the only thing he can think of that was unusual today. He doesn't take anything for GERD/gastritis. Doesn't take NSAIDs, use tobacco, or drink alcohol/coffee. I recommended pepcid 20 mg BID and to f/u with his cardiologist for CP. HEART score is low, pain does not sound cardiac in etiology. I considered admission for this patient but I believe he is safe for discharge. DC w/ discharge instructions/return precautions. All questions answered to patient's satisfaction.  [HN]    Clinical Course User Index [HN] Loetta Rough, MD    Labs: I Ordered, and personally interpreted labs.  The pertinent results include:  ***  Imaging Studies ordered: I ordered imaging studies including *** I independently visualized and interpreted imaging. I agree with the radiologist interpretation  Additional history obtained from ***.  External records from outside source  obtained and reviewed including ***  Cardiac Monitoring: .The patient was maintained on a cardiac monitor.  I personally viewed and interpreted the cardiac monitored which showed an underlying rhythm of: ***  Reevaluation: After the interventions noted above, I reevaluated the patient and found that they have :{resolved/improved/worsened:23923::"improved"}  Social Determinants of Health: .***  Disposition:  ***  Co morbidities that complicate the patient evaluation . Past Medical History:  Diagnosis Date  . Arthritis   . BCC (basal cell carcinoma of skin) 12/08/2013   Bridge of Nose and Right Ear  . CAD (coronary artery disease) 05/04/2018   a. 05/04/18 NSTEMI, 05/05/18 DES to dominant RCA  . Cancer ([facility])    skin lesion  . Former tobacco use   . Hyperlipidemia   . Hypertension   . Macular degeneration   . 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 Nodular basal cell carcinoma (BCC) 09/08/2017   Above Left Ear and Tip Left Nose  . SCC (squamous cell carcinoma) 05/23/2016   Left Cheek - Well Diff  . SCCA (squamous cell carcinoma) of  skin 11/30/2019   Left Parietal Scalp Lateral (in situ)  . SCCA (squamous cell carcinoma) of skin 11/30/2019   Left Parietal Scalp Medial (in situ)  . 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 Superficial basal cell carcinoma (BCC) 11/30/2019   Dorsum of Nose     Medicines No orders of the defined types were placed in this encounter.   I have reviewed the patients home medicines and have made adjustments as needed  Problem List / ED Course: Problem List Items Addressed This Visit   None        {Document critical care time when appropriate:1} {Document review of labs and clinical decision tools ie heart score, Chads2Vasc2 etc:1}  {Document your independent review of radiology images, and any outside records:1} {Document your discussion with family members, caretakers, and with  consultants:1} {Document social determinants of health affecting pt's care:1} {Document your decision making why or why not admission, treatments were needed:1}  This note was created using dictation software, which may contain spelling or grammatical errors.

## 2022-12-04 NOTE — ED Notes (Signed)
Patient verbalizes understanding of discharge instructions. Opportunity for questioning and answers were provided. Armband removed by staff, pt discharged from ED. Pt take to entrance via wheelchair.

## 2022-12-04 NOTE — ED Notes (Signed)
Patient transported to CT 

## 2022-12-04 NOTE — Discharge Instructions (Addendum)
Thank you for coming to [facility] Emergency Department. You were seen for chest pain. We did an exam, labs, and imaging, and these showed no acute findings. It is possible that your symptoms are a result of reflux or gastritis. Please take Pepcid 20 mg twice per day for 30 days. Please avoid spicy foods, NSAIDs like ibuprofen/advil, alcohol, coffee, and tobacco.   Please follow up with your cardiologist within 1-2 weeks for your chest pain.  Do not hesitate to return to the ED or call 911 if you experience: -Worsening symptoms -Lightheadedness, passing out -Fevers/chills -Anything else that concerns you

## 2022-12-04 NOTE — ED Provider Notes (Signed)
[place] EMERGENCY DEPARTMENT AT [facility] Provider Note   CSN: [id number] Arrival date & time: 12/04/22  1804     History  Chief Complaint  Patient presents with   Chest Pain    Kenneth Pope is a 89 y.o. male with HTN, HLD, h/o NSTEMI who presents with lower central chest pain that began gradually and worsened at 1300 PM when he was seated. He took 3 nitroglycerin at home without any relief.  He has never had this happen before.  He had a stent placed for an NSTEMI in 2019.  EMS gave him 3 to 24 mg of aspirin.  He denies any nausea vomiting, diarrhea constipation, shortness of breath, palpitations, fevers chills, cough, lower extremity edema, recent hospitalizations or surgeries.    Chest Pain      Home Medications Prior to Admission medications   Medication Sig Start Date End Date Taking? Authorizing Provider  amLODipine (NORVASC) 10 MG tablet TAKE 1 TABLET BY MOUTH EVERY DAY Patient taking differently: Take 10 mg by mouth at bedtime. 05/12/19  Yes Berton Bon, NP  aspirin EC 81 MG tablet Take 81 mg by mouth daily.   Yes [provider]  atorvastatin (LIPITOR) 80 MG tablet TAKE 1 TABLET(80 MG) BY MOUTH DAILY AT 6 PM Patient taking differently: Take 80 mg by mouth at bedtime. 04/26/22  Yes Rollene Rotunda, MD  Calcium Carb-Cholecalciferol (CALCIUM 600+D3 PO) Take 1 tablet by mouth daily after breakfast.   Yes [provider]  carvedilol (COREG) 3.125 MG tablet TAKE 1 TABLET BY MOUTH TWICE DAILY WITH A MEAL, SCHEDULE OFFICE VISIT FOR FUTURE REFILLS Patient taking differently: Take 3.125 mg by mouth See admin instructions. Take 3.125 mg by mouth with breakfast and supper 02/27/22  Yes Hochrein, Fayrene Fearing, MD  dorzolamide-timolol (COSOPT) 22.3-6.8 MG/ML ophthalmic solution Place 1 drop into both eyes See admin instructions. Instill 1 drop into each eye at 10 AM and 5 PM   Yes [provider]  famotidine (PEPCID) 20 MG tablet Take 1  tablet (20 mg total) by mouth 2 (two) times daily. 12/04/22  Yes Loetta Rough, MD  Multiple Vitamin (MULTIVITAMIN WITH MINERALS) TABS tablet Take 1 tablet by mouth daily with breakfast.   Yes [provider]  Multiple Vitamins-Minerals (PRESERVISION AREDS 2) CAPS Take 1 capsule by mouth 2 (two) times daily.    Yes [provider]  nitroGLYCERIN (NITROSTAT) 0.4 MG SL tablet Place 1 tablet (0.4 mg total) under the tongue every 5 (five) minutes as needed for chest pain. 03/07/20  Yes Rollene Rotunda, MD  trimethoprim-polymyxin b (POLYTRIM) ophthalmic solution Place 1 drop into the left eye See admin instructions. Instill 1 drop into the left eye four times a day for 2 days after injection   Yes [provider]      Allergies    Patient has no known allergies.    Review of Systems   Review of Systems  Cardiovascular:  Positive for chest pain.   A 10 point review of systems was performed and is negative unless otherwise reported in HPI.  Physical Exam Updated Vital Signs BP 133/83   Pulse 98   Temp 97.8 F (36.6 C) (Oral)   Resp 18   Ht 5\' 8"  (1.727 m)   Wt 77.3 kg   SpO2 96%   BMI 25.91 kg/m  Physical Exam General: Normal appearing male, lying in bed.  HEENT: Sclera anicteric, MMM, trachea midline.  Cardiology: RRR, no murmurs/rubs/gallops. BL radial and  DP pulses equal bilaterally. No abnormalities to chest wall.  Resp: Normal respiratory rate and effort. CTAB, no wheezes, rhonchi, crackles.  Abd: Soft, non-tender, non-distended. No rebound tenderness or guarding. No abnormalities to abdominal wall. GU: Deferred. MSK: No peripheral edema or signs of trauma. Extremities without deformity or TTP. No cyanosis or clubbing. Skin: warm, dry. ]\ Neuro: A&Ox4, CNs II-XII grossly intact. MAEs. Sensation grossly intact.  Psych: Normal mood and affect.   ED Results / Procedures / Treatments   Labs (all labs ordered are listed, but only abnormal results are  displayed) Labs Reviewed  COMPREHENSIVE METABOLIC PANEL - Abnormal; Notable for the following components:      Result Value   Potassium 3.4 (*)    Glucose, Bld 138 (*)    Total Bilirubin 1.7 (*)    All other components within normal limits  CBC  LIPASE, BLOOD  TROPONIN I (HIGH SENSITIVITY)  TROPONIN I (HIGH SENSITIVITY)    EKG EKG Interpretation  Date/Time:  Wednesday December 04 2022 18:12:17 EDT Ventricular Rate:  75 PR Interval:  196 QRS Duration: 101 QT Interval:  406 QTC Calculation: 454 R Axis:   -12 Text Interpretation: Sinus rhythm Ventricular premature complex Left atrial enlargement Confirmed by Vivi Barrack [phone]) on 12/04/2022 6:52:58 PM  Radiology CT Angio Chest/Abd/Pel for Dissection W and/or Wo Contrast  Result Date: 12/04/2022 CLINICAL DATA:  Chest pain without relief from nitro EXAM: CT ANGIOGRAPHY CHEST, ABDOMEN AND PELVIS TECHNIQUE: Non-contrast CT of the chest was initially obtained. Multidetector CT imaging through the chest, abdomen and pelvis was performed using the standard protocol during bolus administration of intravenous contrast. Multiplanar reconstructed images and MIPs were obtained and reviewed to evaluate the vascular anatomy. RADIATION DOSE REDUCTION: This exam was performed according to the departmental dose-optimization program which includes automated exposure control, adjustment of the mA and/or kV according to patient size and/or use of iterative reconstruction technique. CONTRAST:  OMNIPAQUE IOHEXOL 350 MG/ML SOLN COMPARISON:  Chest radiographs 12/04/2022 and CT abdomen and pelvis 11/26/2022 FINDINGS: CTA CHEST FINDINGS Cardiovascular: Preferential opacification of the thoracic aorta. No evidence of thoracic aortic aneurysm or dissection. Cardiomegaly. No pericardial effusion. Aortic atherosclerotic calcification. Mediastinum/Nodes: No enlarged mediastinal, hilar, or axillary lymph nodes. Thyroid gland, trachea, and esophagus demonstrate no  significant findings. Lungs/Pleura: Subpleural reticular and ground-glass opacities compatible with interstitial lung disease greatest in the lingula and left lower lobe. Early honeycombing in the lingula. No focal consolidation, pleural effusion, or pneumothorax. Musculoskeletal: No acute fracture or destructive osseous lesion. Review of the MIP images confirms the above findings. CTA ABDOMEN AND PELVIS FINDINGS VASCULAR Aorta: Calcified atherosclerotic plaque without significant narrowing. No aneurysm or dissection. Celiac: Calcified plaque at the origin causes moderate narrowing. No aneurysm or dissection. SMA: Calcified plaque at the origin causes mild narrowing. No aneurysm or dissection. Renals: Patent right renal artery without aneurysm or dissection. Calcified plaque at the origin of the left renal artery causes at least moderate narrowing. No aneurysm or dissection. IMA: Patent. Inflow: Scattered calcified atherosclerotic plaque without significant narrowing. No aneurysm or dissection. Veins: No obvious venous abnormality within the limitations of this arterial phase study. Review of the MIP images confirms the above findings. NON-VASCULAR Hepatobiliary: Unremarkable liver, gallbladder, and biliary tree. Pancreas: Unremarkable. Spleen: Unremarkable. Adrenals/Urinary Tract: Normal adrenal glands. No urinary calculi or hydronephrosis. Unremarkable bladder. Stomach/Bowel: Normal caliber large and small bowel. No bowel wall thickening. Normal appendix. Stomach is within normal limits. Lymphatic: No lymphadenopathy. Reproductive: Enlarged prostate. Other: No free intraperitoneal fluid or  air. Musculoskeletal: No acute fracture or destructive osseous lesion. Review of the MIP images confirms the above findings. IMPRESSION: 1. No evidence of aortic aneurysm or dissection. 2. No acute abnormality in the chest, abdomen, or pelvis. 3. Chronic interstitial lung disease. 4. Prostatomegaly. Electronically Signed   By:  Minerva Fester M.D.   On: 12/04/2022 22:18   DG Chest Port 1 View  Result Date: 12/04/2022 CLINICAL DATA:  Chest pain. EXAM: PORTABLE CHEST 1 VIEW COMPARISON:  May 04, 2018. FINDINGS: Mild cardiomegaly is noted right lung is clear. Mild reticular opacity is noted in left midlung and lung base which may represent pneumonia with probable small left pleural effusion. Bony thorax is unremarkable. IMPRESSION: Left lung opacity concerning for pneumonia with possible small left pleural effusion. Followup PA and lateral chest X-ray is recommended in 3-4 weeks following trial of antibiotic therapy to ensure resolution and exclude underlying malignancy. Electronically Signed   By: Lupita Raider M.D.   On: 12/04/2022 19:26    Procedures Procedures    Medications Ordered in ED Medications  HYDROmorphone (DILAUDID) injection 0.25 mg (0.25 mg Intravenous Given 12/04/22 2159)  iohexol (OMNIPAQUE) 350 MG/ML injection 100 mL (100 mLs Intravenous Contrast Given 12/04/22 2152)  pantoprazole (PROTONIX) injection 40 mg (40 mg Intravenous Given 12/04/22 2233)  alum & mag hydroxide-simeth (MAALOX/MYLANTA) 200-200-20 MG/5ML suspension 30 mL (30 mLs Oral Given 12/04/22 2233)    And  lidocaine (XYLOCAINE) 2 % viscous mouth solution 15 mL (15 mLs Oral Given 12/04/22 2232)  acetaminophen (TYLENOL) tablet 1,000 mg (1,000 mg Oral Given 12/04/22 2309)    ED Course/ Medical Decision Making/ A&P                          Medical Decision Making Amount and/or Complexity of Data Reviewed Labs: ordered. Decision-making details documented in ED Course. Radiology: ordered. Decision-making details documented in ED Course.  Risk OTC drugs. Prescription drug management.    This patient presents to the ED for concern of chest/upper epigastric pain, this involves an extensive number of treatment options, and is a complaint that carries with it a high risk of complications and morbidity.  I considered the following  differential and admission for this acute, potentially life threatening condition.   MDM:    DDX for chest pain includes but is not limited to:  Patient gestures where the pain is located he gestures to his epigastric and lower chest substernal region.  He has very mild epigastric tenderness palpation but no right upper quadrant tenderness to palpation.  Consider ACS or arrhythmia given patient's history of NSTEMI, however EKG shows no signs of ischemia.  Patient endorses severe pain that he has never had before, tension and given his age will perform CT dissection study to rule out AAS.  Patient cannot PERC out based on age, however he has no clinical signs or symptoms of DVT on exam and he is not tachycardic, tachypneic, or hypoxic, overall lower concern for PE.  No infectious symptoms recently to indicate a pneumonia or bronchitis.  Consider gastritis/GERD.  No right upper quadrant tenderness palpation, lower concern for biliary disease.  Could consider pancreatitis as well with epigastric pain.  She has had no nausea or vomiting or hematemesis to indicate esophageal tear or mediastinitis.   Clinical Course as of 12/06/22 0039  Wed Dec 04, 2022  1939 DG Chest [place] 1 View Left lung opacity concerning for pneumonia with possible small left pleural effusion. Followup  PA and lateral chest X-ray is recommended in 3-4 weeks following trial of antibiotic therapy to ensure resolution and exclude underlying malignancy.   [HN]  2020 CBC wnl [HN]  2138 Troponin I (High Sensitivity): 7 neg [HN]  2138 Comprehensive metabolic panel(!) Unremarkable in the context of this patient's presentation  [HN]  2221 Lipase: 37 wnl [HN]  2221 CT Angio Chest/Abd/Pel for Dissection W and/or Wo Contrast 1. No evidence of aortic aneurysm or dissection. 2. No acute abnormality in the chest, abdomen, or pelvis. 3. Chronic interstitial lung disease. 4. Prostatomegaly.   [HN]  2221 Neg repeat trop, reassuring CT  scan. Mildly elevated bilirubin but no RUQ TTP. Will trial protonix/maalox for GERD/gastritis.  [HN]  2323 Patient's pain improving after protonix, maalox/lidocaine. He states that he did eat some spicy soup earlier before his pain started which is the only thing he can think of that was unusual today. He doesn't take anything for GERD/gastritis. Doesn't take NSAIDs, use tobacco, or drink alcohol/coffee. I recommended pepcid 20 mg BID and to f/u with his cardiologist for CP. HEART score is low, pain does not sound cardiac in etiology. I considered admission for this patient but I believe he is safe for discharge. DC w/ discharge instructions/return precautions. All questions answered to patient's satisfaction.  [HN]    Clinical Course User Index [HN] Loetta Rough, MD    Labs: I Ordered, and personally interpreted labs.  The pertinent results include: Those listed above  Imaging Studies ordered: I ordered imaging studies including chest x-ray, CT dissection protocol I independently visualized and interpreted imaging. I agree with the radiologist interpretation  Additional history obtained from chart review  Cardiac Monitoring: The patient was maintained on a cardiac monitor.  I personally viewed and interpreted the cardiac monitored which showed an underlying rhythm of: NSR  Reevaluation: After the interventions noted above, I reevaluated the patient and found that they have :improved  Social Determinants of Health:  patient lives independently in a retirement community  Disposition:  DC w/ discharge instructions/return precautions. All questions answered to patient's satisfaction.    Co morbidities that complicate the patient evaluation  Past Medical History:  Diagnosis Date   Arthritis    BCC (basal cell carcinoma of skin) 12/08/2013   Bridge of Nose and Right Ear   CAD (coronary artery disease) 05/04/2018   a. 05/04/18 NSTEMI, 05/05/18 DES to dominant RCA   Cancer (HCC)     skin lesion   Former tobacco use    Hyperlipidemia    Hypertension    Macular degeneration    Nodular basal cell carcinoma (BCC) 08/15/2015   Bridge of Nose   Nodular basal cell carcinoma (BCC) 09/08/2017   Above Left Ear and Tip Left Nose   SCC (squamous cell carcinoma) 05/23/2016   Left Cheek - Well Diff   SCCA (squamous cell carcinoma) of skin 11/30/2019   Left Parietal Scalp Lateral (in situ)   SCCA (squamous cell carcinoma) of skin 11/30/2019   Left Parietal Scalp Medial (in situ)   Squamous cell carcinoma in situ (SCCIS) 05/20/2017   Mid Forehead   Superficial basal cell carcinoma (BCC) 11/30/2019   Dorsum of Nose     Medicines Meds ordered this encounter  Medications   HYDROmorphone (DILAUDID) injection 0.25 mg   iohexol (OMNIPAQUE) 350 MG/ML injection 100 mL   pantoprazole (PROTONIX) injection 40 mg   AND Linked Order Group    alum & mag hydroxide-simeth (MAALOX/MYLANTA) 200-200-20 MG/5ML suspension 30 mL  lidocaine (XYLOCAINE) 2 % viscous mouth solution 15 mL   acetaminophen (TYLENOL) tablet 1,000 mg   famotidine (PEPCID) 20 MG tablet    Sig: Take 1 tablet (20 mg total) by mouth 2 (two) times daily.    Dispense:  30 tablet    Refill:  0    I have reviewed the patients home medicines and have made adjustments as needed  Problem List / ED Course: Problem List Items Addressed This Visit   None Visit Diagnoses     Atypical chest pain    -  Primary   Acute gastritis without hemorrhage, unspecified gastritis type                       This note was created using dictation software, which may contain spelling or grammatical errors.    Loetta Rough, MD 12/06/22 [phone]

## 2022-12-09 ENCOUNTER — Other Ambulatory Visit: Payer: Self-pay | Admitting: Family Medicine

## 2022-12-09 DIAGNOSIS — R1011 Right upper quadrant pain: Secondary | ICD-10-CM

## 2022-12-09 DIAGNOSIS — R051 Acute cough: Secondary | ICD-10-CM | POA: Diagnosis not present

## 2022-12-09 DIAGNOSIS — R0789 Other chest pain: Secondary | ICD-10-CM | POA: Diagnosis not present

## 2022-12-10 ENCOUNTER — Encounter (INDEPENDENT_AMBULATORY_CARE_PROVIDER_SITE_OTHER): Payer: Medicare Other | Admitting: Ophthalmology

## 2022-12-10 ENCOUNTER — Telehealth: Payer: Self-pay

## 2022-12-10 DIAGNOSIS — I10 Essential (primary) hypertension: Secondary | ICD-10-CM

## 2022-12-10 DIAGNOSIS — H353221 Exudative age-related macular degeneration, left eye, with active choroidal neovascularization: Secondary | ICD-10-CM | POA: Diagnosis not present

## 2022-12-10 DIAGNOSIS — H353112 Nonexudative age-related macular degeneration, right eye, intermediate dry stage: Secondary | ICD-10-CM | POA: Diagnosis not present

## 2022-12-10 DIAGNOSIS — H43813 Vitreous degeneration, bilateral: Secondary | ICD-10-CM

## 2022-12-10 DIAGNOSIS — H35033 Hypertensive retinopathy, bilateral: Secondary | ICD-10-CM | POA: Diagnosis not present

## 2022-12-10 NOTE — Telephone Encounter (Signed)
Transition Care Management Unsuccessful Follow-up Telephone Call  Date of discharge and from where:  [facility] 6/19  Attempts:  1st Attempt  Reason for unsuccessful TCM follow-up call:  Left voice message   Kenneth Pope [facility] Guide, [place] [phone] [address], [place], [place] [address] Phone: [phone] Email: [email or web address]       

## 2022-12-11 ENCOUNTER — Ambulatory Visit
Admission: RE | Admit: 2022-12-11 | Discharge: 2022-12-11 | Disposition: A | Discharge status: Home / Self Care | Payer: Medicare Other | Source: Ambulatory Visit | Attending: Family Medicine | Admitting: Family Medicine

## 2022-12-11 ENCOUNTER — Telehealth: Payer: Self-pay

## 2022-12-11 DIAGNOSIS — R310 Gross hematuria: Secondary | ICD-10-CM | POA: Diagnosis not present

## 2022-12-11 DIAGNOSIS — R1011 Right upper quadrant pain: Secondary | ICD-10-CM | POA: Diagnosis not present

## 2022-12-11 NOTE — Telephone Encounter (Signed)
Transition Care Management Follow-up Telephone Call Date of discharge and from where: Kenneth Pope 6/18 How have you been since you were released from the hospital? Doing better  Any questions or concerns? No  Items Reviewed: Did the pt receive and understand the discharge instructions provided? Yes  Medications obtained and verified? Yes  Other? No  Any new allergies since your discharge? No  Dietary orders reviewed? No Do you have support at home? Yes     Follow up appointments reviewed:  PCP Hospital f/u appt confirmed? Yes  Scheduled to see  on  @ . Specialist Hospital f/u appt confirmed? No  Scheduled to see  on  @ . Are transportation arrangements needed? No  If their condition worsens, is the pt aware to call PCP or go to the Emergency Dept.? Yes Was the patient provided with contact information for the PCP's office or ED? Yes Was to pt encouraged to call back with questions or concerns? Yes

## 2022-12-12 NOTE — Progress Notes (Signed)
Cardiology Clinic Note   Date: 12/13/2022 ID: Marquette, Nessmith [birth year], MRN [id number]  Primary Cardiologist:  Rollene Rotunda, MD  Patient Profile    Kenneth Pope is a 89 y.o. male who presents to the clinic today for follow-up after ED visit.    Past medical history significant for: CAD. Echo 05/04/2018: EF 60 to 65%.  No RWMA.  Indeterminate diastolic function.  Aortic valve sclerosis with trivial AI. LHC 05/05/2018 (NSTEMI): Proximal RCA 85%.  Mid RCA 50%.  Proximal LAD 50% slightly improved with IC nitro.  PCI with DES to proximal RCA. Hypertension. Hyperlipidemia. Lipid panel 02/21/2022: LDL 54, HDL 53, TG 57, total 118.     History of Present Illness    Kenneth Pope first evaluated by cardiology on 05/04/2018 during hospital admission for syncope.  Patient reported he was in his usual state of health and performed 20 minutes of exercise in the morning without cardiac symptoms.  While standing he felt lightheaded and sat down.  While seated he passed out for few seconds but was still out of it when he came to.  Initial BP 85/58 without radial pulses.  He received fluids en route and additional liter bolus upon arrival to the ED.  His troponin was elevated and EKG showed prolonged QT.  Echo showed normal LV function with no RWMA.  He underwent PCI with DES to proximal RCA (details above).  Continues to be followed by Dr. Antoine Poche for the above outlined history.  Last seen in the office by Dr. Antoine Poche on 02/14/2022 for routine follow-up.  He was doing well at that time and no changes were made.  Patient presented to the ED via EMS from home on 12/04/2022 for chest pain unrelieved by nitro x 3.  Patient reported central chest pain that progressively worsened after eating spicy food for lunch.  He took nitroglycerin x 3 without relief.  No associated symptoms.  Pain began migrating from his chest to right lower quadrant.  CTA chest abdomen pelvis was negative for  aortic aneurysm or dissection and no other acute abnormality.  Troponin negative x 2.  EKG showed no acute ischemia.  Patient was given GI cocktail and symptoms improved.  Upon discharge she was instructed to stop Pepcid and start Protonix.  He followed up with PCP and underwent abdominal ultrasound which was normal.  Today, patient is accompanied by his wife.  He reports he is doing well since his ED visit.  Patient denies shortness of breath or dyspnea on exertion. No further chest pain, pressure, or tightness. Denies orthopnea or PND.  He has chronic lower extremity edema that is stable.  No palpitations.  He lives at a [facility].  He walks for exercise about 20 minutes daily.    ROS: All other systems reviewed and are otherwise negative except as noted in History of Present Illness.  Studies Reviewed       EKG is not ordered today.          Physical Exam    VS:  BP (!) 100/58   Pulse 60   Ht 5\' 8"  (1.727 m)   Wt 171 lb (77.6 kg)   SpO2 95%   BMI 26.00 kg/m  , BMI Body mass index is 26 kg/m.  GEN: Well nourished, well developed, in no acute distress. Neck: No JVD or carotid bruits. Cardiac:  RRR. No murmurs. No rubs or gallops.   Respiratory:  Respirations regular and unlabored. Clear to auscultation without rales,  wheezing or rhonchi. GI: Soft, nontender, nondistended. Extremities: Radials/DP/PT 2+ and equal bilaterally. No clubbing or cyanosis.  Mild nonpitting edema bilateral lower extremities. Skin: Warm and dry, no rash. Neuro: Strength intact.  Assessment & Plan    CAD.  S/p PCI with DES to proximal RCA November 2019. Patient with recent ED visit for reflux.  He has had no further chest pain since starting Protonix.  He walks about 20 minutes daily for exercise.  Continue aspirin, carvedilol, atorvastatin, as needed SL NTG. Hypertension: BP today 100/58.  Patient denies headaches, dizziness or vision changes. Continue amlodipine, carvedilol. Hyperlipidemia.  LDL September  2023 54, at goal.  Continue Lipitor.  Disposition: Return for previously scheduled visit with Dr. Antoine Poche or sooner as needed.         Signed, Etta Grandchild. Skylor Schnapp, DNP, NP-C

## 2022-12-13 ENCOUNTER — Ambulatory Visit: Payer: Medicare Other | Attending: Student | Admitting: Student

## 2022-12-13 ENCOUNTER — Encounter: Payer: Self-pay | Admitting: Student

## 2022-12-13 VITALS — BP 100/58 | HR 60 | Ht 68.0 in | Wt 171.0 lb

## 2022-12-13 DIAGNOSIS — I2510 Atherosclerotic heart disease of native coronary artery without angina pectoris: Secondary | ICD-10-CM

## 2022-12-13 DIAGNOSIS — I10 Essential (primary) hypertension: Secondary | ICD-10-CM

## 2022-12-13 DIAGNOSIS — E785 Hyperlipidemia, unspecified: Secondary | ICD-10-CM | POA: Diagnosis not present

## 2022-12-13 NOTE — Patient Instructions (Addendum)
Medication Instructions:  Your physician recommends that you continue on your current medications as directed. Please refer to the Current Medication list given to you today.  *If you need a refill on your cardiac medications before your next appointment, please call your pharmacy*   Lab Work: None ordered today If you have labs (blood work) drawn today and your tests are completely normal, you will receive your results only by: MyChart Message (if you have MyChart) OR A paper copy in the mail If you have any lab test that is abnormal or we need to change your treatment, we will call you to review the results.   Testing/Procedures: None ordered today   Follow-Up: At [facility], you and your health needs are our priority.  As part of our continuing mission to provide you with exceptional heart care, we have created designated Provider Care Teams.  These Care Teams include your primary Cardiologist (physician) and Advanced Practice Providers (APPs -  Physician Assistants and Nurse Practitioners) who all work together to provide you with the care you need, when you need it.  We recommend signing up for the patient portal called "MyChart".  Sign up information is provided on this After Visit Summary.  MyChart is used to connect with patients for Virtual Visits (Telemedicine).  Patients are able to view lab/test results, encounter notes, upcoming appointments, etc.  Non-urgent messages can be sent to your provider as well.   To learn more about what you can do with MyChart, go to [email or web address].    Your next appointment:   Keep scheduled appointment with Dr. Antoine Poche 02/20/2023 at 8:20am

## 2023-01-07 ENCOUNTER — Encounter (INDEPENDENT_AMBULATORY_CARE_PROVIDER_SITE_OTHER): Payer: Medicare Other | Admitting: Ophthalmology

## 2023-01-07 DIAGNOSIS — H43813 Vitreous degeneration, bilateral: Secondary | ICD-10-CM

## 2023-01-07 DIAGNOSIS — H353112 Nonexudative age-related macular degeneration, right eye, intermediate dry stage: Secondary | ICD-10-CM

## 2023-01-07 DIAGNOSIS — I10 Essential (primary) hypertension: Secondary | ICD-10-CM

## 2023-01-07 DIAGNOSIS — H353221 Exudative age-related macular degeneration, left eye, with active choroidal neovascularization: Secondary | ICD-10-CM | POA: Diagnosis not present

## 2023-01-07 DIAGNOSIS — H35033 Hypertensive retinopathy, bilateral: Secondary | ICD-10-CM | POA: Diagnosis not present

## 2023-01-07 DIAGNOSIS — H33301 Unspecified retinal break, right eye: Secondary | ICD-10-CM

## 2023-01-21 ENCOUNTER — Other Ambulatory Visit: Payer: Self-pay | Admitting: Cardiology

## 2023-02-11 ENCOUNTER — Encounter (INDEPENDENT_AMBULATORY_CARE_PROVIDER_SITE_OTHER): Payer: Medicare Other | Admitting: Ophthalmology

## 2023-02-11 DIAGNOSIS — H35033 Hypertensive retinopathy, bilateral: Secondary | ICD-10-CM | POA: Diagnosis not present

## 2023-02-11 DIAGNOSIS — H353112 Nonexudative age-related macular degeneration, right eye, intermediate dry stage: Secondary | ICD-10-CM

## 2023-02-11 DIAGNOSIS — H43813 Vitreous degeneration, bilateral: Secondary | ICD-10-CM

## 2023-02-11 DIAGNOSIS — H33301 Unspecified retinal break, right eye: Secondary | ICD-10-CM

## 2023-02-11 DIAGNOSIS — I10 Essential (primary) hypertension: Secondary | ICD-10-CM | POA: Diagnosis not present

## 2023-02-11 DIAGNOSIS — H353221 Exudative age-related macular degeneration, left eye, with active choroidal neovascularization: Secondary | ICD-10-CM

## 2023-02-17 NOTE — Progress Notes (Unsigned)
  Cardiology Office Note:   Date:  02/20/2023  ID:  Kenneth Pope, DOB 08-Mar-1934, MRN [id number] PCP: Adrian Prince, MD  [facility] Providers Cardiologist:  Rollene Rotunda, MD {  History of Present Illness:   Kenneth Pope is a 89 y.o. male who presents for follow-up of coronary disease.  He was admitted in 2019 with syncope.  He had an elevated troponin.  He eventually had a cardiac catheterization demonstrating 50% stenosis in the LAD.  He had 85% stenosis in the right coronary artery.  He had DES to the RCA.  He was noted also to have probable coronary spasm in the LAD.  His syncope was thought to be related possibly to an arrhythmia and he was started on a beta-blocker.  ACE inhibitor was stopped.  He did have an event monitor and no arrhythmia was noted.    Since he was last seen he has done well.  He walks most days. The patient denies any new symptoms such as chest discomfort, neck or arm discomfort. There has been no new shortness of breath, PND or orthopnea. There have been no reported palpitations, presyncope or syncope.   ROS: As stated in the HPI and negative for all other systems.  Studies Reviewed:    EKG:   NA  Risk Assessment/Calculations:       Physical Exam:   VS:  BP 112/66 (BP Location: Left Arm, Patient Position: Sitting, Cuff Size: Normal)   Pulse 64   Ht 5\' 8"  (1.727 m)   Wt 164 lb 12.8 oz (74.8 kg)   SpO2 95%   BMI 25.06 kg/m    Wt Readings from Last 3 Encounters:  02/20/23 164 lb 12.8 oz (74.8 kg)  12/13/22 171 lb (77.6 kg)  12/04/22 170 lb 6.7 oz (77.3 kg)     GEN: Well nourished, well developed in no acute distress NECK: No JVD; No carotid bruits CARDIAC: RRR, no murmurs, rubs, gallops RESPIRATORY:  Clear to auscultation without rales, wheezing or rhonchi  ABDOMEN: Soft, non-tender, non-distended EXTREMITIES:  No edema; No deformity   ASSESSMENT AND PLAN:    CAD.  S/p PCI with DES to proximal RCA November 2019.  He has had  no new symptoms.  No change in therapy.  Hypertension:    The blood pressure is at target.  No change in therapy.  Hyperlipidemia.  LDL was 54 with an HDL of 53.  No change in therapy.      Follow up with me in one year.   Signed, Rollene Rotunda, MD

## 2023-02-20 ENCOUNTER — Encounter: Payer: Self-pay | Admitting: Cardiology

## 2023-02-20 ENCOUNTER — Ambulatory Visit: Payer: Medicare Other | Attending: Cardiology | Admitting: Cardiology

## 2023-02-20 VITALS — BP 112/66 | HR 64 | Ht 68.0 in | Wt 164.8 lb

## 2023-02-20 DIAGNOSIS — E785 Hyperlipidemia, unspecified: Secondary | ICD-10-CM | POA: Diagnosis not present

## 2023-02-20 DIAGNOSIS — I10 Essential (primary) hypertension: Secondary | ICD-10-CM

## 2023-02-20 DIAGNOSIS — I2510 Atherosclerotic heart disease of native coronary artery without angina pectoris: Secondary | ICD-10-CM | POA: Diagnosis not present

## 2023-02-20 NOTE — Patient Instructions (Signed)
Medication Instructions:  NO CHANGES *If you need a refill on your cardiac medications before your next appointment, please call your pharmacy*   Lab Work: NO LABS If you have labs (blood work) drawn today and your tests are completely normal, you will receive your results only by: MyChart Message (if you have MyChart) OR A paper copy in the mail If you have any lab test that is abnormal or we need to change your treatment, we will call you to review the results.   Testing/Procedures: NO TESTING   Follow-Up: At [facility], you and your health needs are our priority.  As part of our continuing mission to provide you with exceptional heart care, we have created designated Provider Care Teams.  These Care Teams include your primary Cardiologist (physician) and Advanced Practice Providers (APPs -  Physician Assistants and Nurse Practitioners) who all work together to provide you with the care you need, when you need it.  Your next appointment:   1 year(s)  Provider:   Rollene Rotunda, MD

## 2023-02-24 DIAGNOSIS — L219 Seborrheic dermatitis, unspecified: Secondary | ICD-10-CM | POA: Diagnosis not present

## 2023-02-24 DIAGNOSIS — L570 Actinic keratosis: Secondary | ICD-10-CM | POA: Diagnosis not present

## 2023-02-24 DIAGNOSIS — C44319 Basal cell carcinoma of skin of other parts of face: Secondary | ICD-10-CM | POA: Diagnosis not present

## 2023-02-25 DIAGNOSIS — R7301 Impaired fasting glucose: Secondary | ICD-10-CM | POA: Diagnosis not present

## 2023-02-25 DIAGNOSIS — R972 Elevated prostate specific antigen [PSA]: Secondary | ICD-10-CM | POA: Diagnosis not present

## 2023-02-25 DIAGNOSIS — E785 Hyperlipidemia, unspecified: Secondary | ICD-10-CM | POA: Diagnosis not present

## 2023-02-26 DIAGNOSIS — Z961 Presence of intraocular lens: Secondary | ICD-10-CM | POA: Diagnosis not present

## 2023-02-26 DIAGNOSIS — H401111 Primary open-angle glaucoma, right eye, mild stage: Secondary | ICD-10-CM | POA: Diagnosis not present

## 2023-02-26 DIAGNOSIS — H1045 Other chronic allergic conjunctivitis: Secondary | ICD-10-CM | POA: Diagnosis not present

## 2023-02-26 DIAGNOSIS — H401123 Primary open-angle glaucoma, left eye, severe stage: Secondary | ICD-10-CM | POA: Diagnosis not present

## 2023-03-03 DIAGNOSIS — I10 Essential (primary) hypertension: Secondary | ICD-10-CM | POA: Diagnosis not present

## 2023-03-03 DIAGNOSIS — R82998 Other abnormal findings in urine: Secondary | ICD-10-CM | POA: Diagnosis not present

## 2023-03-03 DIAGNOSIS — Z1339 Encounter for screening examination for other mental health and behavioral disorders: Secondary | ICD-10-CM | POA: Diagnosis not present

## 2023-03-03 DIAGNOSIS — Z1331 Encounter for screening for depression: Secondary | ICD-10-CM | POA: Diagnosis not present

## 2023-03-03 DIAGNOSIS — Z0000 Encounter for general adult medical examination without abnormal findings: Secondary | ICD-10-CM | POA: Diagnosis not present

## 2023-03-03 DIAGNOSIS — Z23 Encounter for immunization: Secondary | ICD-10-CM | POA: Diagnosis not present

## 2023-03-03 DIAGNOSIS — J849 Interstitial pulmonary disease, unspecified: Secondary | ICD-10-CM | POA: Diagnosis not present

## 2023-03-17 ENCOUNTER — Encounter (INDEPENDENT_AMBULATORY_CARE_PROVIDER_SITE_OTHER): Payer: Medicare Other | Admitting: Ophthalmology

## 2023-03-17 DIAGNOSIS — H353112 Nonexudative age-related macular degeneration, right eye, intermediate dry stage: Secondary | ICD-10-CM | POA: Diagnosis not present

## 2023-03-17 DIAGNOSIS — H33301 Unspecified retinal break, right eye: Secondary | ICD-10-CM

## 2023-03-17 DIAGNOSIS — H353221 Exudative age-related macular degeneration, left eye, with active choroidal neovascularization: Secondary | ICD-10-CM | POA: Diagnosis not present

## 2023-03-17 DIAGNOSIS — H43813 Vitreous degeneration, bilateral: Secondary | ICD-10-CM

## 2023-03-17 DIAGNOSIS — I10 Essential (primary) hypertension: Secondary | ICD-10-CM | POA: Diagnosis not present

## 2023-03-17 DIAGNOSIS — H35033 Hypertensive retinopathy, bilateral: Secondary | ICD-10-CM | POA: Diagnosis not present

## 2023-03-18 DIAGNOSIS — C44319 Basal cell carcinoma of skin of other parts of face: Secondary | ICD-10-CM | POA: Diagnosis not present

## 2023-04-01 ENCOUNTER — Inpatient Hospital Stay (HOSPITAL_COMMUNITY): Payer: Medicare Other

## 2023-04-01 ENCOUNTER — Inpatient Hospital Stay (HOSPITAL_COMMUNITY)
Admission: EM | Admit: 2023-04-01 | Discharge: 2023-04-03 | DRG: 522 | Disposition: A | Discharge status: Home Health | Payer: Medicare Other | Attending: Internal Medicine | Admitting: Internal Medicine

## 2023-04-01 ENCOUNTER — Emergency Department (HOSPITAL_COMMUNITY): Payer: Medicare Other

## 2023-04-01 ENCOUNTER — Other Ambulatory Visit: Payer: Self-pay

## 2023-04-01 ENCOUNTER — Encounter (HOSPITAL_COMMUNITY): Payer: Self-pay | Admitting: Internal Medicine

## 2023-04-01 ENCOUNTER — Institutional Professional Consult (permissible substitution): Payer: Medicare Other | Admitting: Internal Medicine

## 2023-04-01 DIAGNOSIS — Z955 Presence of coronary angioplasty implant and graft: Secondary | ICD-10-CM | POA: Diagnosis not present

## 2023-04-01 DIAGNOSIS — Z7982 Long term (current) use of aspirin: Secondary | ICD-10-CM | POA: Diagnosis not present

## 2023-04-01 DIAGNOSIS — E876 Hypokalemia: Secondary | ICD-10-CM | POA: Diagnosis not present

## 2023-04-01 DIAGNOSIS — Z471 Aftercare following joint replacement surgery: Secondary | ICD-10-CM | POA: Diagnosis not present

## 2023-04-01 DIAGNOSIS — Z87891 Personal history of nicotine dependence: Secondary | ICD-10-CM

## 2023-04-01 DIAGNOSIS — Z79899 Other long term (current) drug therapy: Secondary | ICD-10-CM | POA: Diagnosis not present

## 2023-04-01 DIAGNOSIS — H3530 Unspecified macular degeneration: Secondary | ICD-10-CM | POA: Diagnosis not present

## 2023-04-01 DIAGNOSIS — Z85828 Personal history of other malignant neoplasm of skin: Secondary | ICD-10-CM | POA: Diagnosis not present

## 2023-04-01 DIAGNOSIS — W010XXA Fall on same level from slipping, tripping and stumbling without subsequent striking against object, initial encounter: Secondary | ICD-10-CM | POA: Diagnosis present

## 2023-04-01 DIAGNOSIS — E785 Hyperlipidemia, unspecified: Secondary | ICD-10-CM | POA: Diagnosis not present

## 2023-04-01 DIAGNOSIS — I10 Essential (primary) hypertension: Secondary | ICD-10-CM | POA: Diagnosis present

## 2023-04-01 DIAGNOSIS — S72011A Unspecified intracapsular fracture of right femur, initial encounter for closed fracture: Secondary | ICD-10-CM | POA: Diagnosis not present

## 2023-04-01 DIAGNOSIS — D72829 Elevated white blood cell count, unspecified: Secondary | ICD-10-CM | POA: Diagnosis not present

## 2023-04-01 DIAGNOSIS — M25572 Pain in left ankle and joints of left foot: Secondary | ICD-10-CM | POA: Diagnosis not present

## 2023-04-01 DIAGNOSIS — I252 Old myocardial infarction: Secondary | ICD-10-CM | POA: Diagnosis not present

## 2023-04-01 DIAGNOSIS — S72001A Fracture of unspecified part of neck of right femur, initial encounter for closed fracture: Principal | ICD-10-CM | POA: Diagnosis present

## 2023-04-01 DIAGNOSIS — S72041A Displaced fracture of base of neck of right femur, initial encounter for closed fracture: Secondary | ICD-10-CM | POA: Diagnosis not present

## 2023-04-01 DIAGNOSIS — I2510 Atherosclerotic heart disease of native coronary artery without angina pectoris: Secondary | ICD-10-CM | POA: Diagnosis present

## 2023-04-01 DIAGNOSIS — M1990 Unspecified osteoarthritis, unspecified site: Secondary | ICD-10-CM | POA: Diagnosis not present

## 2023-04-01 DIAGNOSIS — E871 Hypo-osmolality and hyponatremia: Secondary | ICD-10-CM | POA: Diagnosis not present

## 2023-04-01 DIAGNOSIS — Z86008 Personal history of in-situ neoplasm of other site: Secondary | ICD-10-CM | POA: Diagnosis not present

## 2023-04-01 DIAGNOSIS — M25551 Pain in right hip: Secondary | ICD-10-CM | POA: Diagnosis not present

## 2023-04-01 DIAGNOSIS — W19XXXA Unspecified fall, initial encounter: Secondary | ICD-10-CM | POA: Diagnosis not present

## 2023-04-01 DIAGNOSIS — Z96641 Presence of right artificial hip joint: Secondary | ICD-10-CM | POA: Diagnosis not present

## 2023-04-01 DIAGNOSIS — E7849 Other hyperlipidemia: Secondary | ICD-10-CM | POA: Diagnosis not present

## 2023-04-01 LAB — CBC WITH DIFFERENTIAL/PLATELET
Abs Immature Granulocytes: 0.03 10*3/uL (ref 0.00–0.07)
Basophils Absolute: 0.1 10*3/uL (ref 0.0–0.1)
Basophils Relative: 1 %
Eosinophils Absolute: 0.3 10*3/uL (ref 0.0–0.5)
Eosinophils Relative: 3 %
HCT: 41.7 % (ref 39.0–52.0)
Hemoglobin: 14.4 g/dL (ref 13.0–17.0)
Immature Granulocytes: 0 %
Lymphocytes Relative: 26 %
Lymphs Abs: 2.2 10*3/uL (ref 0.7–4.0)
MCH: 34.4 pg — ABNORMAL HIGH (ref 26.0–34.0)
MCHC: 34.5 g/dL (ref 30.0–36.0)
MCV: 99.5 fL (ref 80.0–100.0)
Monocytes Absolute: 1 10*3/uL (ref 0.1–1.0)
Monocytes Relative: 11 %
Neutro Abs: 5.1 10*3/uL (ref 1.7–7.7)
Neutrophils Relative %: 59 %
Platelets: 210 10*3/uL (ref 150–400)
RBC: 4.19 MIL/uL — ABNORMAL LOW (ref 4.22–5.81)
RDW: 12.9 % (ref 11.5–15.5)
WBC: 8.7 10*3/uL (ref 4.0–10.5)
nRBC: 0 % (ref 0.0–0.2)

## 2023-04-01 LAB — BASIC METABOLIC PANEL
Anion gap: 8 (ref 5–15)
BUN: 13 mg/dL (ref 8–23)
CO2: 24 mmol/L (ref 22–32)
Calcium: 8.7 mg/dL — ABNORMAL LOW (ref 8.9–10.3)
Chloride: 103 mmol/L (ref 98–111)
Creatinine, Ser: 0.65 mg/dL (ref 0.61–1.24)
GFR, Estimated: >60 mL/min (ref >60)
Glucose, Bld: 134 mg/dL — ABNORMAL HIGH (ref 70–99)
Potassium: 3.8 mmol/L (ref 3.5–5.1)
Sodium: 135 mmol/L (ref 135–145)

## 2023-04-01 LAB — SURGICAL PCR SCREEN
MRSA, PCR: NEGATIVE
Staphylococcus aureus: POSITIVE — AB

## 2023-04-01 MED ORDER — ENSURE PRE-SURGERY PO LIQD
296.0000 mL | Freq: Once | ORAL | Status: AC
  Administered 2023-04-01: 296 mL via ORAL
  Filled 2023-04-01: qty 296

## 2023-04-01 MED ORDER — ACETAMINOPHEN 325 MG PO TABS
650.0000 mg | ORAL_TABLET | Freq: Four times a day (QID) | ORAL | Status: DC | PRN
  Administered 2023-04-02: 650 mg via ORAL
  Filled 2023-04-01: qty 2

## 2023-04-01 MED ORDER — FAMOTIDINE 20 MG PO TABS
20.0000 mg | ORAL_TABLET | Freq: Once | ORAL | Status: AC
  Administered 2023-04-01: 20 mg via ORAL
  Filled 2023-04-01: qty 1

## 2023-04-01 MED ORDER — POLYMYXIN B-TRIMETHOPRIM 10000-0.1 UNIT/ML-% OP SOLN
1.0000 [drp] | Freq: Four times a day (QID) | OPHTHALMIC | Status: DC
  Filled 2023-04-01: qty 10

## 2023-04-01 MED ORDER — DORZOLAMIDE HCL-TIMOLOL MAL 2-0.5 % OP SOLN
1.0000 [drp] | Freq: Two times a day (BID) | OPHTHALMIC | Status: DC
  Administered 2023-04-01 – 2023-04-03 (×3): 1 [drp] via OPHTHALMIC
  Filled 2023-04-01: qty 10

## 2023-04-01 MED ORDER — OXYCODONE HCL 5 MG PO TABS: 5.0000 mg | ORAL_TABLET | ORAL | Status: DC | PRN

## 2023-04-01 MED ORDER — PANTOPRAZOLE SODIUM 40 MG PO TBEC
40.0000 mg | DELAYED_RELEASE_TABLET | Freq: Every day | ORAL | Status: DC
  Administered 2023-04-02 – 2023-04-03 (×2): 40 mg via ORAL
  Filled 2023-04-01 (×3): qty 1

## 2023-04-01 MED ORDER — ALUM & MAG HYDROXIDE-SIMETH 200-200-20 MG/5ML PO SUSP
30.0000 mL | Freq: Once | ORAL | Status: AC
  Administered 2023-04-01: 30 mL via ORAL
  Filled 2023-04-01: qty 30

## 2023-04-01 MED ORDER — ATORVASTATIN CALCIUM 40 MG PO TABS
80.0000 mg | ORAL_TABLET | Freq: Every day | ORAL | Status: DC
  Administered 2023-04-01 – 2023-04-03 (×3): 80 mg via ORAL
  Filled 2023-04-01 (×3): qty 2

## 2023-04-01 MED ORDER — ENOXAPARIN SODIUM 40 MG/0.4ML IJ SOSY
40.0000 mg | PREFILLED_SYRINGE | INTRAMUSCULAR | Status: DC
  Administered 2023-04-01: 40 mg via SUBCUTANEOUS
  Filled 2023-04-01: qty 0.4

## 2023-04-01 MED ORDER — ASPIRIN 81 MG PO TBEC
81.0000 mg | DELAYED_RELEASE_TABLET | Freq: Every day | ORAL | Status: DC
  Administered 2023-04-02: 81 mg via ORAL
  Filled 2023-04-01: qty 1

## 2023-04-01 MED ORDER — AMLODIPINE BESYLATE 10 MG PO TABS
10.0000 mg | ORAL_TABLET | Freq: Every day | ORAL | Status: DC
  Administered 2023-04-01 – 2023-04-03 (×3): 10 mg via ORAL
  Filled 2023-04-01 (×3): qty 1

## 2023-04-01 MED ORDER — ONDANSETRON HCL 4 MG PO TABS: 4.0000 mg | ORAL_TABLET | Freq: Four times a day (QID) | ORAL | Status: DC | PRN

## 2023-04-01 MED ORDER — CHLORHEXIDINE GLUCONATE CLOTH 2 % EX PADS
6.0000 | MEDICATED_PAD | Freq: Every day | CUTANEOUS | Status: DC
  Administered 2023-04-02 – 2023-04-03 (×2): 6 via TOPICAL

## 2023-04-01 MED ORDER — SENNOSIDES-DOCUSATE SODIUM 8.6-50 MG PO TABS: 1.0000 | ORAL_TABLET | Freq: Every evening | ORAL | Status: DC | PRN

## 2023-04-01 MED ORDER — ONDANSETRON HCL 4 MG/2ML IJ SOLN: 4.0000 mg | Freq: Four times a day (QID) | INTRAMUSCULAR | Status: DC | PRN

## 2023-04-01 MED ORDER — MORPHINE SULFATE (PF) 2 MG/ML IV SOLN
2.0000 mg | INTRAVENOUS | Status: DC | PRN
  Administered 2023-04-01 – 2023-04-02 (×3): 2 mg via INTRAVENOUS
  Filled 2023-04-01 (×3): qty 1

## 2023-04-01 MED ORDER — CARVEDILOL 3.125 MG PO TABS
3.1250 mg | ORAL_TABLET | Freq: Two times a day (BID) | ORAL | Status: DC
  Administered 2023-04-01 – 2023-04-03 (×3): 3.125 mg via ORAL
  Filled 2023-04-01 (×3): qty 1

## 2023-04-01 MED ORDER — ALBUTEROL SULFATE (2.5 MG/3ML) 0.083% IN NEBU: 2.5000 mg | INHALATION_SOLUTION | RESPIRATORY_TRACT | Status: DC | PRN

## 2023-04-01 MED ORDER — MORPHINE SULFATE (PF) 4 MG/ML IV SOLN
4.0000 mg | Freq: Once | INTRAVENOUS | Status: AC
  Administered 2023-04-01: 4 mg via INTRAVENOUS
  Filled 2023-04-01: qty 1

## 2023-04-01 MED ORDER — MUPIROCIN 2 % EX OINT
1.0000 | TOPICAL_OINTMENT | Freq: Two times a day (BID) | CUTANEOUS | Status: DC
  Administered 2023-04-01 – 2023-04-03 (×4): 1 via NASAL
  Filled 2023-04-01 (×3): qty 22

## 2023-04-01 MED ORDER — ACETAMINOPHEN 650 MG RE SUPP: 650.0000 mg | Freq: Four times a day (QID) | RECTAL | Status: DC | PRN

## 2023-04-01 NOTE — H&P (Signed)
History and Physical  Kenneth Pope ZOX:[id number] DOB: [birth year] DOA: 04/01/2023  PCP: Adrian Prince, MD   Chief Complaint: Right hip pain  HPI: Kenneth Pope is a 89 y.o. male with medical history significant for hypertension, hyperlipidemia, CAD with stent to RCA 2019 on daily aspirin being admitted to the hospital with right hip fracture after mechanical fall.  Says he has been in his usual state of health without any fevers, chills, nausea, vomiting, chest pain, shortness of breath when this morning he was pulling a hose out at his house, fell over backward onto his right side.  No loss of consciousness, palpitations, chest pain, or hitting his head.  Immediate right hip pain and inability to ambulate.  Workup in the emergency department is benign as below, he has a right hip fracture.  ER provider discussed with orthopedic surgery who recommends the patient stay n.p.o. for potential surgery today, and requests hospitalist admission.  Review of Systems: Please see HPI for pertinent positives and negatives. A complete 10 system review of systems are otherwise negative.  Past Medical History:  Diagnosis Date   Arthritis    BCC (basal cell carcinoma of skin) 12/08/2013   Bridge of Nose and Right Ear   CAD (coronary artery disease) 05/04/2018   a. 05/04/18 NSTEMI, 05/05/18 DES to dominant RCA   Cancer (HCC)    skin lesion   Former tobacco use    Hyperlipidemia    Hypertension    Macular degeneration    Nodular basal cell carcinoma (BCC) 08/15/2015   Bridge of Nose   Nodular basal cell carcinoma (BCC) 09/08/2017   Above Left Ear and Tip Left Nose   SCC (squamous cell carcinoma) 05/23/2016   Left Cheek - Well Diff   SCCA (squamous cell carcinoma) of skin 11/30/2019   Left Parietal Scalp Lateral (in situ)   SCCA (squamous cell carcinoma) of skin 11/30/2019   Left Parietal Scalp Medial (in situ)   Squamous cell carcinoma in situ (SCCIS) 05/20/2017   Mid Forehead    Superficial basal cell carcinoma (BCC) 11/30/2019   Dorsum of Nose   Past Surgical History:  Procedure Laterality Date   CARDIAC CATHETERIZATION     CORONARY STENT INTERVENTION N/A 05/05/2018   Procedure: CORONARY STENT INTERVENTION;  Surgeon: Lennette Bihari, MD;  Location: [facility];  Service: Cardiovascular;  Laterality: N/A;   HERNIA REPAIR     LEFT HEART CATH AND CORONARY ANGIOGRAPHY N/A 05/05/2018   Procedure: LEFT HEART CATH AND CORONARY ANGIOGRAPHY;  Surgeon: Lennette Bihari, MD;  Location: [facility];  Service: Cardiovascular;  Laterality: N/A;    Social History:  reports that he quit smoking about 48 years ago. His smoking use included cigarettes. He started smoking about 63 years ago. He has never used smokeless tobacco. He reports that he does not drink alcohol and does not use drugs.   No Known Allergies  Family History  Problem Relation Age of Onset   CAD Neg Hx      Prior to Admission medications   Medication Sig Start Date End Date Taking? Authorizing Provider  acetaminophen (TYLENOL) 500 MG tablet Take 500 mg by mouth as needed for headache.    [provider]  amLODipine (NORVASC) 10 MG tablet TAKE 1 TABLET BY MOUTH EVERY DAY 05/12/19   Berton Bon, NP  aspirin EC 81 MG tablet Take 81 mg by mouth daily.    [provider]  atorvastatin (LIPITOR) 80 MG tablet TAKE  1 TABLET(80 MG) BY MOUTH DAILY AT 6 PM 04/26/22   Rollene Rotunda, MD  Calcium Carb-Cholecalciferol (CALCIUM 600+D3 PO) Take 1 tablet by mouth daily after breakfast.    [provider]  carvedilol (COREG) 3.125 MG tablet Take 1 tablet (3.125 mg total) by mouth 2 (two) times daily with a meal. 01/21/23   Rollene Rotunda, MD  dorzolamide-timolol (COSOPT) 22.3-6.8 MG/ML ophthalmic solution Place 1 drop into both eyes See admin instructions. Instill 1 drop into each eye at 10 AM and 5 PM    [provider]  Multiple Vitamin (MULTIVITAMIN WITH MINERALS) TABS  tablet Take 1 tablet by mouth daily with breakfast.    [provider]  Multiple Vitamins-Minerals (PRESERVISION AREDS 2) CAPS Take 1 capsule by mouth 2 (two) times daily.     [provider]  nitroGLYCERIN (NITROSTAT) 0.4 MG SL tablet Place 1 tablet (0.4 mg total) under the tongue every 5 (five) minutes as needed for chest pain. 03/07/20   Rollene Rotunda, MD  pantoprazole (PROTONIX) 40 MG tablet daily. 12/09/22   [provider]  trimethoprim-polymyxin b (POLYTRIM) ophthalmic solution Place 1 drop into the left eye See admin instructions. Instill 1 drop into the left eye four times a day for 2 days after injection    [provider]    Physical Exam: BP (!) 154/84 (BP Location: Right Arm)   Pulse 63   Temp 97.7 F (36.5 C) (Oral)   Resp 16   Ht 5\' 8"  (1.727 m)   Wt 73 kg   SpO2 97% Comment: Simultaneous filing. User may not have seen previous data.  BMI 24.48 kg/m   General:  Alert, oriented, calm, in no acute distress, resting on a stretcher in the ER, looks younger than his stated age. Cardiovascular: RRR, no murmurs or rubs, no peripheral edema  Respiratory: clear to auscultation bilaterally, no wheezes, no crackles  Abdomen: soft, nontender, nondistended, normal bowel tones heard  Skin: dry, no rashes  Musculoskeletal: no joint effusions, normal range of motion except in right hip due to pain, the right lower extremity is foreshortened and externally rotated Psychiatric: appropriate affect, normal speech  Neurologic: extraocular muscles intact, clear speech, moving all extremities with intact sensorium         Labs on Admission:  Basic Metabolic Panel: Recent Labs  Lab 04/01/23 0949  NA 135  K 3.8  CL 103  CO2 24  GLUCOSE 134*  BUN 13  CREATININE 0.65  CALCIUM 8.7*   Liver Function Tests: No results for input(s): "AST", "ALT", "ALKPHOS", "BILITOT", "PROT", "ALBUMIN" in the last 168 hours. No results for input(s): "LIPASE", "AMYLASE"  in the last 168 hours. No results for input(s): "AMMONIA" in the last 168 hours. CBC: Recent Labs  Lab 04/01/23 0949  WBC 8.7  NEUTROABS 5.1  HGB 14.4  HCT 41.7  MCV 99.5  PLT 210   Cardiac Enzymes: No results for input(s): "CKTOTAL", "CKMB", "CKMBINDEX", "TROPONINI" in the last 168 hours.  BNP (last 3 results) No results for input(s): "BNP" in the last 8760 hours.  ProBNP (last 3 results) No results for input(s): "PROBNP" in the last 8760 hours.  CBG: No results for input(s): "GLUCAP" in the last 168 hours.  Radiological Exams on Admission: Hip x-ray has not been read yet by radiology, but shows a right femoral neck fracture.  Assessment/Plan Morris Gene Mikelson is a 89 y.o. male with medical history significant for hypertension, hyperlipidemia, CAD with stent to RCA 2019 on daily aspirin  being admitted to the hospital with right hip fracture after mechanical fall.  Right hip fracture-due to mechanical fall -Inpatient admission -Pain and nausea control as needed -Orthopedic surgery consulted -Keep n.p.o.  Hypertension-continue home amlodipine and Coreg  CAD-continue Coreg  Hyperlipidemia-Lipitor  DVT prophylaxis: Lovenox     Code Status: Full Code  Consults called: Orthopedic surgery  Admission status: The appropriate patient status for this patient is INPATIENT. Inpatient status is judged to be reasonable and necessary in order to provide the required intensity of service to ensure the patient's safety. The patient's presenting symptoms, physical exam findings, and initial radiographic and laboratory data in the context of their chronic comorbidities is felt to place them at high risk for further clinical deterioration. Furthermore, it is not anticipated that the patient will be medically stable for discharge from the hospital within 2 midnights of admission.    I certify that at the point of admission it is my clinical judgment that the patient will require  inpatient hospital care spanning beyond 2 midnights from the point of admission due to high intensity of service, high risk for further deterioration and high frequency of surveillance required  Time spent: 49 minutes  Hesper Venturella Sharlette Dense MD [facility] Pager [phone]  If 7PM-7AM, please contact night-coverage [email or web address] Password [email or web address]  04/01/2023, 11:31 AM

## 2023-04-01 NOTE — Consult Note (Addendum)
Patient ID: Kenneth Pope MRN: [id number] DOB/AGE: 1933/08/30 89 y.o.  Admit date: 04/01/2023  Admission Diagnoses:  Principal Problem:   Closed right hip fracture (HCC)   HPI: Ortho consult for right displaced FNF sustained 04/01/23.  PMH notable for hypertension, hyperlipidemia, CAD with stent to RCA 2019 on daily aspirin.  Denies numbness/tingling.  Past Medical History: Past Medical History:  Diagnosis Date   Arthritis    BCC (basal cell carcinoma of skin) 12/08/2013   Bridge of Nose and Right Ear   CAD (coronary artery disease) 05/04/2018   a. 05/04/18 NSTEMI, 05/05/18 DES to dominant RCA   Cancer (HCC)    skin lesion   Former tobacco use    Hyperlipidemia    Hypertension    Macular degeneration    Nodular basal cell carcinoma (BCC) 08/15/2015   Bridge of Nose   Nodular basal cell carcinoma (BCC) 09/08/2017   Above Left Ear and Tip Left Nose   SCC (squamous cell carcinoma) 05/23/2016   Left Cheek - Well Diff   SCCA (squamous cell carcinoma) of skin 11/30/2019   Left Parietal Scalp Lateral (in situ)   SCCA (squamous cell carcinoma) of skin 11/30/2019   Left Parietal Scalp Medial (in situ)   Squamous cell carcinoma in situ (SCCIS) 05/20/2017   Mid Forehead   Superficial basal cell carcinoma (BCC) 11/30/2019   Dorsum of Nose    Surgical History: Past Surgical History:  Procedure Laterality Date   CARDIAC CATHETERIZATION     CORONARY STENT INTERVENTION N/A 05/05/2018   Procedure: CORONARY STENT INTERVENTION;  Surgeon: Lennette Bihari, MD;  Location: [facility];  Service: Cardiovascular;  Laterality: N/A;   HERNIA REPAIR     LEFT HEART CATH AND CORONARY ANGIOGRAPHY N/A 05/05/2018   Procedure: LEFT HEART CATH AND CORONARY ANGIOGRAPHY;  Surgeon: Lennette Bihari, MD;  Location: [facility];  Service: Cardiovascular;  Laterality: N/A;    Family History: Family History  Problem Relation Age of Onset   CAD Neg Hx     Social  History: Social History   Socioeconomic History   Marital status: Married    Spouse name: [name]   Number of children: Not on file   Years of education: Not on file   Highest education level: Not on file  Occupational History   Occupation: retired  Tobacco Use   Smoking status: Former    Current packs/day: 0.00    Types: Cigarettes    Start date: 1961    Quit date: 1976    Years since quitting: 48.8   Smokeless tobacco: Never  Vaping Use   Vaping status: Never Used  Substance and Sexual Activity   Alcohol use: Never   Drug use: Never   Sexual activity: Not on file  Other Topics Concern   Not on file  Social History Narrative   Not on file   Social Determinants of Health   Financial Resource Strain: Low Risk  (06/16/2018)   Overall Financial Resource Strain (CARDIA)    Difficulty of Paying Living Expenses: Not hard at all  Food Insecurity: No Food Insecurity (04/01/2023)   Hunger Vital Sign    Worried About Running Out of Food in the Last Year: Never true    Ran Out of Food in the Last Year: Never true  Transportation Needs: No Transportation Needs (04/01/2023)   PRAPARE - Administrator, Civil Service (Medical): No    Lack of Transportation (Non-Medical): No  Physical  Activity: Insufficiently Active (06/16/2018)   Exercise Vital Sign    Days of Exercise per Week: 4 days    Minutes of Exercise per Session: 30 min  Stress: Stress Concern Present (06/16/2018)   [employer] of Occupational Health - Occupational Stress Questionnaire    Feeling of Stress : To some extent  Social Connections: Not on file  Intimate Partner Violence: Not At Risk (04/01/2023)   Humiliation, Afraid, Rape, and Kick questionnaire    Fear of Current or Ex-Partner: No    Emotionally Abused: No    Physically Abused: No    Sexually Abused: No    Allergies: Patient has no known allergies.  Medications: I have reviewed the patient's current medications.  Vital  Signs: Patient Vitals for the past 24 hrs:  BP Temp Temp src Pulse Resp SpO2 Height Weight  04/01/23 1500 (!) 148/79 98.2 F (36.8 C) Oral 73 17 97 % -- --  04/01/23 1347 120/72 97.7 F (36.5 C) Oral 62 18 97 % -- --  04/01/23 0943 -- -- -- -- -- -- 5\' 8"  (1.727 m) 73 kg  04/01/23 0941 (!) 154/84 97.7 F (36.5 C) Oral 63 16 97 % -- --    Radiology: DG Hip Unilat With Pelvis 2-3 Views Right  Result Date: 04/01/2023 CLINICAL DATA:  Hip pain.  Fall. EXAM: DG HIP (WITH OR WITHOUT PELVIS) 2-3V RIGHT COMPARISON:  None Available. FINDINGS: There is an acute transcervical fracture of the right femoral neck with associated impaction and superior displacement of the distal segment measuring approximately 2 cm. No additional fracture identified. The left hip joint is anatomically aligned with degenerative changes. IMPRESSION: Acute, impacted, and displaced right femoral neck transcervical fracture. Electronically Signed   By: Hart Robinsons M.D.   On: 04/01/2023 12:25    Labs: Recent Labs    04/01/23 0949  WBC 8.7  RBC 4.19*  HCT 41.7  PLT 210   Recent Labs    04/01/23 0949  NA 135  K 3.8  CL 103  CO2 24  BUN 13  CREATININE 0.65  GLUCOSE 134*  CALCIUM 8.7*   No results for input(s): "LABPT", "INR" in the last 72 hours.  Review of Systems: ROS as detailed in HPI  Physical Exam: Body mass index is 24.48 kg/m.  Physical Exam   Gen: AAOx3, NAD Comfortable at rest  Right Lower Extremity: Skin intact Shortened, ER RLE + TTP over prox femur Pain with log roll and axial load ADF/APF/EHL 5/5 SILT throughout DP, PT 2+ to palp CR < 2s   Assessment and Plan: Ortho consult for right displaced FNF sustained 04/01/23  -history, exam and imaging reviewed at length with patient -plan for right partial vs total hip replacement -anticipate OR 04/02/23 with arthroplasty colleagues -preop anesthesia nerve block per hip fx protocol -please keep NPO and hold VTE ppx from  MN -TDWB RLE pending surgery -PT/OT postop  Netta Cedars, MD Orthopaedic Surgeon [facility] [phone]  The risks and benefits were presented and reviewed. The risks due to perioperative fracture, hardware failure/irritation, new/persistent/recurrent infection, stiffness, nerve/vessel/tendon injury, nonunion/malunion of any fracture, wound healing issues, allograft usage, development of arthritis, failure of this surgery, possibility of external fixation in certain situations, possibility of delayed definitive surgery, need for further surgery, prolonged wound care including further soft tissue coverage procedures, thromboembolic events, anesthesia/medical complications/events perioperatively and beyond, amputation, death among others were discussed. The patient acknowledged the explanation, agreed to proceed with the plan.

## 2023-04-01 NOTE — Plan of Care (Signed)

## 2023-04-01 NOTE — ED Triage Notes (Signed)
Pt BIBA from home. C/o R hip pain having mech fall. Shortening and rotation noted in R leg.  No LOC, not on blood thinners.  AOx4

## 2023-04-01 NOTE — ED Provider Notes (Signed)
[place]MENT AT [facility] Provider Note   CSN: [id number] Arrival date & time: 04/01/23  5643     History  Chief Complaint  Patient presents with   Fall   Hip Pain    Kenneth Pope is a 89 y.o. male.  Patient is an 89 year old male with a past medical history of CAD and hypertension presenting to the emergency department after a fall.  Patient states that he was putting away his house in his yard this morning in the grass and lost his balance and fell onto his right hip.  He denies hitting his head or losing consciousness.  He states that he has been unable to stand or ambulate since the fall.  He states that he has some paresthesias in his right foot but denies any weakness.  He denies any other pain or injuries from the fall.  He denies any blood thinner use.  He denies any lightheadedness or dizziness prior to the fall.  The history is provided by the patient.  Fall  Hip Pain       Home Medications Prior to Admission medications   Medication Sig Start Date End Date Taking? Authorizing Provider  acetaminophen (TYLENOL) 500 MG tablet Take 500 mg by mouth as needed for headache.    [provider]  amLODipine (NORVASC) 10 MG tablet TAKE 1 TABLET BY MOUTH EVERY DAY 05/12/19   Berton Bon, NP  aspirin EC 81 MG tablet Take 81 mg by mouth daily.    [provider]  atorvastatin (LIPITOR) 80 MG tablet TAKE 1 TABLET(80 MG) BY MOUTH DAILY AT 6 PM 04/26/22   Rollene Rotunda, MD  Calcium Carb-Cholecalciferol (CALCIUM 600+D3 PO) Take 1 tablet by mouth daily after breakfast.    [provider]  carvedilol (COREG) 3.125 MG tablet Take 1 tablet (3.125 mg total) by mouth 2 (two) times daily with a meal. 01/21/23   Rollene Rotunda, MD  dorzolamide-timolol (COSOPT) 22.3-6.8 MG/ML ophthalmic solution Place 1 drop into both eyes See admin instructions. Instill 1 drop into each eye at 10 AM and 5 PM    [provider]   Multiple Vitamin (MULTIVITAMIN WITH MINERALS) TABS tablet Take 1 tablet by mouth daily with breakfast.    [provider]  Multiple Vitamins-Minerals (PRESERVISION AREDS 2) CAPS Take 1 capsule by mouth 2 (two) times daily.     [provider]  nitroGLYCERIN (NITROSTAT) 0.4 MG SL tablet Place 1 tablet (0.4 mg total) under the tongue every 5 (five) minutes as needed for chest pain. 03/07/20   Rollene Rotunda, MD  pantoprazole (PROTONIX) 40 MG tablet daily. 12/09/22   [provider]  trimethoprim-polymyxin b (POLYTRIM) ophthalmic solution Place 1 drop into the left eye See admin instructions. Instill 1 drop into the left eye four times a day for 2 days after injection    [provider]      Allergies    Patient has no known allergies.    Review of Systems   Review of Systems  Physical Exam Updated Vital Signs BP (!) 154/84 (BP Location: Right Arm)   Pulse 63   Temp 97.7 F (36.5 C) (Oral)   Resp 16   Ht 5\' 8"  (1.727 m)   Wt 73 kg   SpO2 97% Comment: Simultaneous filing. User may not have seen previous data.  BMI 24.48 kg/m  Physical Exam Vitals and nursing note reviewed.  Constitutional:      General: He is not in  acute distress.    Appearance: Normal appearance.  HENT:     Head: Normocephalic and atraumatic.     Nose: Nose normal.     Mouth/Throat:     Mouth: Mucous membranes are moist.     Pharynx: Oropharynx is clear.  Eyes:     Extraocular Movements: Extraocular movements intact.     Conjunctiva/sclera: Conjunctivae normal.     Pupils: Pupils are equal, round, and reactive to light.  Neck:     Comments: No midline neck tenderness Cardiovascular:     Rate and Rhythm: Normal rate and regular rhythm.     Pulses: Normal pulses.     Heart sounds: Normal heart sounds.  Pulmonary:     Effort: Pulmonary effort is normal.     Breath sounds: Normal breath sounds.  Abdominal:     General: Abdomen is flat.     Palpations: Abdomen is soft.      Tenderness: There is no abdominal tenderness.  Musculoskeletal:     Cervical back: Normal range of motion and neck supple.     Comments: No midline back tenderness No bony tenderness of bilateral upper extremities or left lower extremity Pelvis stable, nontender Right lower extremity shortened and externally rotated, tenderness to right hip with increased pain with internal rotation, no tenderness to right femur, knee or ankle  Skin:    General: Skin is warm and dry.  Neurological:     General: No focal deficit present.     Mental Status: He is alert and oriented to person, place, and time.     Sensory: Sensory deficit (Paresthesias to right foot otherwise sensation intact) present.     Motor: No weakness.  Psychiatric:        Mood and Affect: Mood normal.        Behavior: Behavior normal.     ED Results / Procedures / Treatments   Labs (all labs ordered are listed, but only abnormal results are displayed) Labs Reviewed  BASIC METABOLIC PANEL - Abnormal; Notable for the following components:      Result Value   Glucose, Bld 134 (*)    Calcium 8.7 (*)    All other components within normal limits  CBC WITH DIFFERENTIAL/PLATELET - Abnormal; Notable for the following components:   RBC 4.19 (*)    MCH 34.4 (*)    All other components within normal limits    EKG None  Radiology No results found.  Procedures Procedures    Medications Ordered in ED Medications  famotidine (PEPCID) tablet 20 mg (has no administration in time range)  alum & mag hydroxide-simeth (MAALOX/MYLANTA) 200-200-20 MG/5ML suspension 30 mL (has no administration in time range)  morphine (PF) 4 MG/ML injection 4 mg (4 mg Intravenous Given 04/01/23 0959)    ED Course/ Medical Decision Making/ A&P Clinical Course as of 04/01/23 1109  Tue Apr 01, 2023  1029 On my evaluation of XR, concerning for R femoral neck hip fracture. Orthopedics will be called. Patient also reports indigestion and requests  an antacid.  [VK]  1107 I spoke with Dr. Odis Hollingshead of orthopedics who recommended admission to medicine, keep NPO in case he will be able to go the OR today, otherwise will plan for OR tomorrow. [VK]    Clinical Course User Index [VK] Rexford Maus, DO  Medical Decision Making This patient presents to the ED with chief complaint(s) of fall, R hip pain with pertinent past medical history of CAD, HTN which further complicates the presenting complaint. The complaint involves an extensive differential diagnosis and also carries with it a high risk of complications and morbidity.    The differential diagnosis includes hip fracture, dislocation, no presyncopal symptoms a syncopal fall unlikely, no other traumatic injuries seen on exam, no evidence of neurovascular injury  Additional history obtained: Additional history obtained from EMS  Records reviewed Primary Care Documents and outpatient cardiology records  ED Course and Reassessment: On patient's arrival he is hemodynamically stable in no acute distress.  Right lower extremity is notably shortened and externally rotated concerning for hip fracture.  No other traumatic injuries seen on exam, had no head trauma and is not on any blood thinners making an ICH or mass effect unlikely.  Patient will have hip x-ray, labs and will be given pain control and will be closely reassessed.  Independent labs interpretation:  The following labs were independently interpreted: within normal range  Independent visualization of imaging: - I independently visualized the following imaging with scope of interpretation limited to determining acute life threatening conditions related to emergency care: R hip XR, which revealed R hip fracture  Consultation: - Consulted or discussed management/test interpretation w/ external professional: orthopedics, hospitalist  Consideration for admission or further workup: patient  requires admission for further management of his hip fracture Social Determinants of health: N/A    Amount and/or Complexity of Data Reviewed Labs: ordered. Radiology: ordered.  Risk OTC drugs. Prescription drug management. Decision regarding hospitalization.          Final Clinical Impression(s) / ED Diagnoses Final diagnoses:  Closed right hip fracture, initial encounter [facility])    Rx / DC Orders ED Discharge Orders     None         Rexford Maus, DO 04/01/23 1109

## 2023-04-01 NOTE — Progress Notes (Deleted)
OV 04/01/2023  Subjective:  Patient ID: Kenneth Pope, male , DOB: 05/24/1934 , age 89 y.o. , MRN: [id number] , ADDRESS: [id number] [place] [address] PCP Adrian Prince, MD Patient Care Team: Adrian Prince, MD as PCP - General (Endocrinology) Rollene Rotunda, MD as PCP - Cardiology (Cardiology) Janalyn Harder, MD (Inactive) as Consulting Physician (Dermatology)  This Provider for this visit: Treatment Team:  Attending Provider: Kalman Shan, MD    04/01/2023 -  No chief complaint on file.    HPI Kenneth Pope 89 y.o. -    CT Chest data from date: angio 12/04/22  - personally visualized and independently interpreted : *** - my findings are: ***  arrative & Impression  CLINICAL DATA:  Chest pain without relief from nitro   EXAM: CT ANGIOGRAPHY CHEST, ABDOMEN AND PELVIS   TECHNIQUE: Non-contrast CT of the chest was initially obtained.   Multidetector CT imaging through the chest, abdomen and pelvis was performed using the standard protocol during bolus administration of intravenous contrast. Multiplanar reconstructed images and MIPs were obtained and reviewed to evaluate the vascular anatomy.   RADIATION DOSE REDUCTION: This exam was performed according to the departmental dose-optimization program which includes automated exposure control, adjustment of the mA and/or kV according to patient size and/or use of iterative reconstruction technique.   CONTRAST:  OMNIPAQUE IOHEXOL 350 MG/ML SOLN   COMPARISON:  Chest radiographs 12/04/2022 and CT abdomen and pelvis 11/26/2022   FINDINGS: CTA CHEST FINDINGS   Cardiovascular: Preferential opacification of the thoracic aorta. No evidence of thoracic aortic aneurysm or dissection. Cardiomegaly. No pericardial effusion. Aortic atherosclerotic calcification.   Mediastinum/Nodes: No enlarged mediastinal, hilar, or axillary lymph nodes. Thyroid gland, trachea, and esophagus  demonstrate no significant findings.   Lungs/Pleura: Subpleural reticular and ground-glass opacities compatible with interstitial lung disease greatest in the lingula and left lower lobe. Early honeycombing in the lingula. No focal consolidation, pleural effusion, or pneumothorax.   Musculoskeletal: No acute fracture or destructive osseous lesion.   Review of the MIP images confirms the above findings.   CTA ABDOMEN AND PELVIS FINDINGS   VASCULAR   Aorta: Calcified atherosclerotic plaque without significant narrowing. No aneurysm or dissection.   Celiac: Calcified plaque at the origin causes moderate narrowing. No aneurysm or dissection.   SMA: Calcified plaque at the origin causes mild narrowing. No aneurysm or dissection.   Renals: Patent right renal artery without aneurysm or dissection. Calcified plaque at the origin of the left renal artery causes at least moderate narrowing. No aneurysm or dissection.   IMA: Patent.   Inflow: Scattered calcified atherosclerotic plaque without significant narrowing. No aneurysm or dissection.   Veins: No obvious venous abnormality within the limitations of this arterial phase study.   Review of the MIP images confirms the above findings.   NON-VASCULAR   Hepatobiliary: Unremarkable liver, gallbladder, and biliary tree.   Pancreas: Unremarkable.   Spleen: Unremarkable.   Adrenals/Urinary Tract: Normal adrenal glands. No urinary calculi or hydronephrosis. Unremarkable bladder.   Stomach/Bowel: Normal caliber large and small bowel. No bowel wall thickening. Normal appendix. Stomach is within normal limits.   Lymphatic: No lymphadenopathy.   Reproductive: Enlarged prostate.   Other: No free intraperitoneal fluid or air.   Musculoskeletal: No acute fracture or destructive osseous lesion.   Review of the MIP images confirms the above findings.   IMPRESSION: 1. No evidence of aortic aneurysm or dissection. 2. No acute  abnormality in the chest, abdomen,  or pelvis. 3. Chronic interstitial lung disease. 4. Prostatomegaly.     Electronically Signed   By: Minerva Fester M.D.   On: 12/04/2022 22:18     PFT      No data to display            LAB RESULTS last 96 hours No results found.  LAB RESULTS last 90 days No results found for this or any previous visit (from the past 2160 hour(s)).       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   reports that he quit smoking about 48 years ago. His smoking use included cigarettes. He started smoking about 63 years ago. He has never used smokeless tobacco.  Past Surgical History:  Procedure Laterality Date   CARDIAC CATHETERIZATION     CORONARY STENT INTERVENTION N/A 05/05/2018   Procedure: CORONARY STENT INTERVENTION;  Surgeon: Lennette Bihari, MD;  Location: [facility] INVASIVE CV LAB;  Service: Cardiovascular;  Laterality: N/A;   HERNIA REPAIR     LEFT HEART CATH AND CORONARY ANGIOGRAPHY N/A 05/05/2018   Procedure: LEFT HEART CATH AND CORONARY ANGIOGRAPHY;  Surgeon: Lennette Bihari, MD;  Location: [facility] INVASIVE CV LAB;  Service: Cardiovascular;  Laterality: N/A;    No Known Allergies  Immunization History  Administered Date(s) Administered   PFIZER(Purple Top)SARS-COV-2 Vaccination 07/02/2019, 07/24/2019    Family History  Problem Relation Age of Onset   CAD Neg Hx      Current Outpatient Medications:    acetaminophen  (TYLENOL) 500 MG tablet, Take 500 mg by mouth as needed for headache., Disp: , Rfl:    amLODipine (NORVASC) 10 MG tablet, TAKE 1 TABLET BY MOUTH EVERY DAY, Disp: 90 tablet, Rfl: 2   aspirin EC 81 MG tablet, Take 81 mg by mouth daily., Disp: , Rfl:    atorvastatin (LIPITOR) 80 MG tablet, TAKE 1 TABLET(80 MG) BY MOUTH DAILY AT 6 PM, Disp: 90 tablet, Rfl: 3   Calcium Carb-Cholecalciferol (CALCIUM 600+D3 PO), Take 1 tablet by mouth daily after breakfast., Disp: , Rfl:    carvedilol (COREG) 3.125 MG tablet, Take 1 tablet (3.125 mg total) by mouth 2 (two) times daily with a meal., Disp: 180 tablet, Rfl: 3   dorzolamide-timolol (COSOPT) 22.3-6.8 MG/ML ophthalmic solution, Place 1 drop into both eyes See admin instructions. Instill 1 drop into each eye at 10 AM and 5 PM, Disp: , Rfl:    Multiple Vitamin (MULTIVITAMIN WITH MINERALS) TABS tablet, Take 1 tablet by mouth daily with breakfast., Disp: , Rfl:    Multiple Vitamins-Minerals (PRESERVISION AREDS 2) CAPS, Take 1 capsule by mouth 2 (two) times daily. , Disp: , Rfl:    nitroGLYCERIN (NITROSTAT) 0.4 MG SL tablet, Place 1 tablet (0.4 mg total) under the tongue every 5 (five) minutes as needed for chest pain., Disp: 25 tablet, Rfl: 11   pantoprazole (PROTONIX) 40 MG tablet, daily., Disp: , Rfl:    trimethoprim-polymyxin b (POLYTRIM) ophthalmic solution, Place 1 drop into the left eye See admin instructions. Instill 1 drop into the left eye four times a day for 2 days after injection, Disp: ,  Rfl:       Objective:   There were no vitals filed for this visit.  Estimated body mass index is 25.06 kg/m as calculated from the following:   Height as of 02/20/23: 5\' 8"  (1.727 m).   Weight as of 02/20/23: 164 lb 12.8 oz (74.8 kg).  @WEIGHTCHANGE @  There were no vitals filed for this visit.   Physical Exam   General: No distress. *** O2 at rest: *** Cane present: *** Sitting in wheel chair: *** Frail: *** Obese: *** Neuro: Alert and Oriented x 3. GCS  15. Speech normal Psych: Pleasant Resp:  Barrel Chest - ***.  Wheeze - ***, Crackles - ***, No overt respiratory distress CVS: Normal heart sounds. Murmurs - *** Ext: Stigmata of Connective Tissue Disease - *** HEENT: Normal upper airway. PEERL +. No post nasal drip        Assessment:     No diagnosis found.     Plan:     There are no Patient Instructions on file for this visit.   FOLLOWUP No follow-ups on file.    SIGNATURE    Dr. Kalman Shan, M.D., F.C.C.P,  Pulmonary and Critical Care Medicine Staff Physician, [facility] Director - Interstitial Lung Disease  Program  Pulmonary Fibrosis [facility] at [facility] [place], [place], [address]  Pager: [phone], If no answer or between  15:00h - 7:00h: call [phone] Telephone: [phone]  8:42 AM 04/01/2023   Moderate Complexity MDM OFFICE  2021 E/M guidelines, first released in 2021, with minor revisions added in 2023 and 2024 Must meet the requirements for 2 out of 3 dimensions to qualify.    Number and complexity of problems addressed Amount and/or complexity of data reviewed Risk of complications and/or morbidity  One or more chronic illness with mild exacerbation, OR progression, OR  side effects of treatment  Two or more stable chronic illnesses  One undiagnosed new problem with uncertain prognosis  One acute illness with systemic symptoms   One Acute complicated injury Must meet the requirements for 1 of 3 of the categories)  Category 1: Tests and documents, historian  Any combination of 3 of the following:  Assessment requiring an independent historian  Review of prior external note(s) from each unique source  Review of results of each unique test  Ordering of each unique test    Category 2: Interpretation of tests   Independent interpretation of a test performed by another physician/other qualified health care professional  (not separately reported)  Category 3: Discuss management/tests  Discussion of management or test interpretation with external physician/other qualified health care professional/appropriate source (not separately reported) Moderate risk of morbidity from additional diagnostic testing or treatment Examples only:  Prescription drug management  Decision regarding minor surgery with identfied patient or procedure risk factors  Decision regarding elective major surgery without identified patient or procedure risk factors  Diagnosis or treatment significantly limited by social determinants of health             HIGh Complexity  OFFICE   2021 E/M guidelines, first released in 2021, with minor revisions added in 2023. Must meet the requirements for 2 out of 3 dimensions to qualify.    Number and complexity of problems addressed Amount and/or complexity of data reviewed Risk of complications and/or morbidity  Severe exacerbation of chronic illness  Acute or chronic illnesses that may pose a threat to life or bodily function,  e.g., multiple trauma, acute MI, pulmonary embolus, severe respiratory distress, progressive rheumatoid arthritis, psychiatric illness with potential threat to self or others, peritonitis, acute renal failure, abrupt change in neurological status Must meet the requirements for 2 of 3 of the categories)  Category 1: Tests and documents, historian  Any combination of 3 of the following:  Assessment requiring an independent historian  Review of prior external note(s) from each unique source  Review of results of each unique test  Ordering of each unique test    Category 2: Interpretation of tests    Independent interpretation of a test performed by another physician/other qualified health care professional (not separately reported)  Category 3: Discuss management/tests  Discussion of management or test interpretation with external physician/other  qualified health care professional/appropriate source (not separately reported)  HIGH risk of morbidity from additional diagnostic testing or treatment Examples only:  Drug therapy requiring intensive monitoring for toxicity  Decision for elective major surgery with identified pateint or procedure risk factors  Decision regarding hospitalization or escalation of level of care  Decision for DNR or to de-escalate care   Parenteral controlled  substances            LEGEND - Independent interpretation involves the interpretation of a test for which there is a CPT code, and an interpretation or report is customary. When a review and interpretation of a test is performed and documented by the provider, but not separately reported (billed), then this would represent an independent interpretation. This report does not need to conform to the usual standards of a complete report of the test. This does not include interpretation of tests that do not have formal reports such as a complete blood count with differential and blood cultures. Examples would include reviewing a chest radiograph and documenting in the medical record an interpretation, but not separately reporting (billing) the interpretation of the chest radiograph.   An appropriate source includes professionals who are not health care professionals but may be involved in the management of the patient, such as a Clinical research associate, upper officer, case manager or teacher, and does not include discussion with family or informal caregivers.    - SDOH: SDOH are the conditions in the environments where people are born, live, learn, work, play, worship, and age that affect a wide range of health, functioning, and quality-of-life outcomes and risks. (e.g., housing, food insecurity, transportation, etc.). SDOH-related Z codes ranging from Z55-Z65 are the ICD-10-CM diagnosis codes used to document SDOH data Z55 - Problems related to education and  literacy Z56 - Problems related to employment and unemployment Z57 - Occupational exposure to risk factors Z58 - Problems related to physical environment Z59 - Problems related to housing and economic circumstances [phone] - Problems related to social environment [phone] - Problems related to upbringing [phone] - Other problems related to primary support group, including family circumstances Z70 - Problems related to certain psychosocial circumstances Z65 - Problems related to other psychosocial circumstances

## 2023-04-01 NOTE — H&P (View-Only) (Signed)
Patient ID: Kenneth Pope MRN: [id number] DOB/AGE: 1933/08/30 89 y.o.  Admit date: 04/01/2023  Admission Diagnoses:  Principal Problem:   Closed right hip fracture (HCC)   HPI: Ortho consult for right displaced FNF sustained 04/01/23.  PMH notable for hypertension, hyperlipidemia, CAD with stent to RCA 2019 on daily aspirin.  Denies numbness/tingling.  Past Medical History: Past Medical History:  Diagnosis Date   Arthritis    BCC (basal cell carcinoma of skin) 12/08/2013   Bridge of Nose and Right Ear   CAD (coronary artery disease) 05/04/2018   a. 05/04/18 NSTEMI, 05/05/18 DES to dominant RCA   Cancer (HCC)    skin lesion   Former tobacco use    Hyperlipidemia    Hypertension    Macular degeneration    Nodular basal cell carcinoma (BCC) 08/15/2015   Bridge of Nose   Nodular basal cell carcinoma (BCC) 09/08/2017   Above Left Ear and Tip Left Nose   SCC (squamous cell carcinoma) 05/23/2016   Left Cheek - Well Diff   SCCA (squamous cell carcinoma) of skin 11/30/2019   Left Parietal Scalp Lateral (in situ)   SCCA (squamous cell carcinoma) of skin 11/30/2019   Left Parietal Scalp Medial (in situ)   Squamous cell carcinoma in situ (SCCIS) 05/20/2017   Mid Forehead   Superficial basal cell carcinoma (BCC) 11/30/2019   Dorsum of Nose    Surgical History: Past Surgical History:  Procedure Laterality Date   CARDIAC CATHETERIZATION     CORONARY STENT INTERVENTION N/A 05/05/2018   Procedure: CORONARY STENT INTERVENTION;  Surgeon: Lennette Bihari, MD;  [facility];  Service: Cardiovascular;  Laterality: N/A;   HERNIA REPAIR     LEFT HEART CATH AND CORONARY ANGIOGRAPHY N/A 05/05/2018   Procedure: LEFT HEART CATH AND CORONARY ANGIOGRAPHY;  Surgeon: Lennette Bihari, MD;  [facility]: [facility];  Service: Cardiovascular;  Laterality: N/A;    Family History: Family History  Problem Relation Age of Onset   CAD Neg Hx     Social  History: Social History   Socioeconomic History   Marital status: Married    Spouse name: Kenneth Pope   Number of children: Not on file   Years of education: Not on file   Highest education level: Not on file  Occupational History   Occupation: retired  Tobacco Use   Smoking status: Former    Current packs/day: 0.00    Types: Cigarettes    Start date: 1961    Quit date: 1976    Years since quitting: 48.8   Smokeless tobacco: Never  Vaping Use   Vaping status: Never Used  Substance and Sexual Activity   Alcohol use: Never   Drug use: Never   Sexual activity: Not on file  Other Topics Concern   Not on file  Social History Narrative   Not on file   Social Determinants of Health   Financial Resource Strain: Low Risk  (06/16/2018)   Overall Financial Resource Strain (CARDIA)    Difficulty of Paying Living Expenses: Not hard at all  Food Insecurity: No Food Insecurity (04/01/2023)   Hunger Vital Sign    Worried About Running Out of Food in the Last Year: Never true    Ran Out of Food in the Last Year: Never true  Transportation Needs: No Transportation Needs (04/01/2023)   PRAPARE - Administrator, Civil Service (Medical): No    Lack of Transportation (Non-Medical): No  Physical  Activity: Insufficiently Active (06/16/2018)   Exercise Vital Sign    Days of Exercise per Week: 4 days    Minutes of Exercise per Session: 30 min  Stress: Stress Concern Present (06/16/2018)   [employer] of Occupational Health - Occupational Stress Questionnaire    Feeling of Stress : To some extent  Social Connections: Not on file  Intimate Partner Violence: Not At Risk (04/01/2023)   Humiliation, Afraid, Rape, and Kick questionnaire    Fear of Current or Ex-Partner: No    Emotionally Abused: No    Physically Abused: No    Sexually Abused: No    Allergies: Patient has no known allergies.  Medications: I have reviewed the patient's current medications.  Vital  Signs: Patient Vitals for the past 24 hrs:  BP Temp Temp src Pulse Resp SpO2 Height Weight  04/01/23 1500 (!) 148/79 98.2 F (36.8 C) Oral 73 17 97 % -- --  04/01/23 1347 120/72 97.7 F (36.5 C) Oral 62 18 97 % -- --  04/01/23 0943 -- -- -- -- -- -- 5\' 8"  (1.727 m) 73 kg  04/01/23 0941 (!) 154/84 97.7 F (36.5 C) Oral 63 16 97 % -- --    Radiology: DG Hip Unilat With Pelvis 2-3 Views Right  Result Date: 04/01/2023 CLINICAL DATA:  Hip pain.  Fall. EXAM: DG HIP (WITH OR WITHOUT PELVIS) 2-3V RIGHT COMPARISON:  None Available. FINDINGS: There is an acute transcervical fracture of the right femoral neck with associated impaction and superior displacement of the distal segment measuring approximately 2 cm. No additional fracture identified. The left hip joint is anatomically aligned with degenerative changes. IMPRESSION: Acute, impacted, and displaced right femoral neck transcervical fracture. Electronically Signed   By: Hart Robinsons M.D.   On: 04/01/2023 12:25    Labs: Recent Labs    04/01/23 0949  WBC 8.7  RBC 4.19*  HCT 41.7  PLT 210   Recent Labs    04/01/23 0949  NA 135  K 3.8  CL 103  CO2 24  BUN 13  CREATININE 0.65  GLUCOSE 134*  CALCIUM 8.7*   No results for input(s): "LABPT", "INR" in the last 72 hours.  Review of Systems: ROS as detailed in HPI  Physical Exam: Body mass index is 24.48 kg/m.  Physical Exam   Gen: AAOx3, NAD Comfortable at rest  Right Lower Extremity: Skin intact Shortened, ER RLE + TTP over prox femur Pain with log roll and axial load ADF/APF/EHL 5/5 SILT throughout DP, PT 2+ to palp CR < 2s   Assessment and Plan: Ortho consult for right displaced FNF sustained 04/01/23  -history, exam and imaging reviewed at length with patient -plan for right partial vs total hip replacement -anticipate OR 04/02/23 with arthroplasty colleagues -preop anesthesia nerve block per hip fx protocol -please keep NPO and hold VTE ppx from  MN -TDWB RLE pending surgery -PT/OT postop  Netta Cedars, MD Orthopaedic Surgeon [facility] [phone]  The risks and benefits were presented and reviewed. The risks due to perioperative fracture, hardware failure/irritation, new/persistent/recurrent infection, stiffness, nerve/vessel/tendon injury, nonunion/malunion of any fracture, wound healing issues, allograft usage, development of arthritis, failure of this surgery, possibility of external fixation in certain situations, possibility of delayed definitive surgery, need for further surgery, prolonged wound care including further soft tissue coverage procedures, thromboembolic events, anesthesia/medical complications/events perioperatively and beyond, amputation, death among others were discussed. The patient acknowledged the explanation, agreed to proceed with the plan.

## 2023-04-01 NOTE — ED Notes (Signed)
ED TO INPATIENT HANDOFF REPORT  ED Nurse Name and Phone #: Huntley Dec [phone]  S Name/Age/Gender Kenneth Pope 89 y.o. male Room/Bed: WA17/WA17  Code Status   Code Status: Full Code  Home/SNF/Other Patient oriented to: self, place, time, and situation Is this baseline? Yes   Triage Complete: Triage complete  Chief Complaint Closed right hip fracture (HCC) [S72.001A]  Triage Note Pt BIBA from home. C/o R hip pain having mech fall. Shortening and rotation noted in R leg.  No LOC, not on blood thinners.  AOx4   Allergies No Known Allergies  Level of Care/Admitting Diagnosis ED Disposition     ED Disposition  Admit   Condition  --   Comment  Hospital Area: [facility] [[id number]]  Level of Care: Med-Surg [[id number]]  May admit patient to [name] or [facility] if equivalent level of care is available:: Yes  Covid Evaluation: Asymptomatic - no recent exposure (last 10 days) testing not required  Diagnosis: Closed right hip fracture [facility]) [[id number]]  Admitting Physician: Maryln Gottron [[id number]]  Attending Physician: Kirby Crigler, MIR [email or web address] [[id number]]  Certification:: I certify this patient will need inpatient services for at least 2 midnights  Expected Medical Readiness: 04/04/2023          B Medical/Surgery History Past Medical History:  Diagnosis Date   Arthritis    BCC (basal cell carcinoma of skin) 12/08/2013   Bridge of Nose and Right Ear   CAD (coronary artery disease) 05/04/2018   a. 05/04/18 NSTEMI, 05/05/18 DES to dominant RCA   Cancer (HCC)    skin lesion   Former tobacco use    Hyperlipidemia    Hypertension    Macular degeneration    Nodular basal cell carcinoma (BCC) 08/15/2015   Bridge of Nose   Nodular basal cell carcinoma (BCC) 09/08/2017   Above Left Ear and Tip Left Nose   SCC (squamous cell carcinoma) 05/23/2016   Left Cheek - Well Diff   SCCA (squamous cell carcinoma) of skin 11/30/2019   Left Parietal Scalp Lateral  (in situ)   SCCA (squamous cell carcinoma) of skin 11/30/2019   Left Parietal Scalp Medial (in situ)   Squamous cell carcinoma in situ (SCCIS) 05/20/2017   Mid Forehead   Superficial basal cell carcinoma (BCC) 11/30/2019   Dorsum of Nose   Past Surgical History:  Procedure Laterality Date   CARDIAC CATHETERIZATION     CORONARY STENT INTERVENTION N/A 05/05/2018   Procedure: CORONARY STENT INTERVENTION;  Surgeon: Lennette Bihari, MD;  [facility];  Service: Cardiovascular;  Laterality: N/A;   HERNIA REPAIR     LEFT HEART CATH AND CORONARY ANGIOGRAPHY N/A 05/05/2018   Procedure: LEFT HEART CATH AND CORONARY ANGIOGRAPHY;  Surgeon: Lennette Bihari, MD;  [facility];  Service: Cardiovascular;  Laterality: N/A;     A IV Location/Drains/Wounds Patient Lines/Drains/Airways Status     Active Line/Drains/Airways     Name Placement date Placement time Site Days   Peripheral IV 04/01/23 20 G Posterior;Right Forearm 04/01/23  0954  Forearm  less than 1            Intake/Output Last 24 hours No intake or output data in the 24 hours ending 04/01/23 1323  Labs/Imaging Results for orders placed or performed during the hospital encounter of 04/01/23 (from the past 48 hour(s))  Basic metabolic panel     Status: Abnormal   Collection Time: 04/01/23  9:49 AM  Result Value  Ref Range   Sodium 135 135 - 145 mmol/L   Potassium 3.8 3.5 - 5.1 mmol/L   Chloride 103 98 - 111 mmol/L   CO2 24 22 - 32 mmol/L   Glucose, Bld 134 (H) 70 - 99 mg/dL    Comment: Glucose reference range applies only to samples taken after fasting for at least 8 hours.   BUN 13 8 - 23 mg/dL   Creatinine, Ser 1.19 0.61 - 1.24 mg/dL   Calcium 8.7 (L) 8.9 - 10.3 mg/dL   GFR, Estimated >14 >78 mL/min    Comment: (NOTE) Calculated using the CKD-EPI Creatinine Equation (2021)    Anion gap 8 5 - 15    Comment: Performed at [facility], [address] [place], [place]  [address]  CBC with Differential     Status: Abnormal   Collection Time: 04/01/23  9:49 AM  Result Value Ref Range   WBC 8.7 4.0 - 10.5 K/uL   RBC 4.19 (L) 4.22 - 5.81 MIL/uL   Hemoglobin 14.4 13.0 - 17.0 g/dL   HCT 13.0 86.5 - 78.4 %   MCV 99.5 80.0 - 100.0 fL   MCH 34.4 (H) 26.0 - 34.0 pg   MCHC 34.5 30.0 - 36.0 g/dL   RDW 69.6 29.5 - 28.4 %   Platelets 210 150 - 400 K/uL   nRBC 0.0 0.0 - 0.2 %   Neutrophils Relative % 59 %   Neutro Abs 5.1 1.7 - 7.7 K/uL   Lymphocytes Relative 26 %   Lymphs Abs 2.2 0.7 - 4.0 K/uL   Monocytes Relative 11 %   Monocytes Absolute 1.0 0.1 - 1.0 K/uL   Eosinophils Relative 3 %   Eosinophils Absolute 0.3 0.0 - 0.5 K/uL   Basophils Relative 1 %   Basophils Absolute 0.1 0.0 - 0.1 K/uL   Immature Granulocytes 0 %   Abs Immature Granulocytes 0.03 0.00 - 0.07 K/uL    Comment: Performed at [facility], [address] [place], [place] [address]   DG Hip Unilat With Pelvis 2-3 Views Right  Result Date: 04/01/2023 CLINICAL DATA:  Hip pain.  Fall. EXAM: DG HIP (WITH OR WITHOUT PELVIS) 2-3V RIGHT COMPARISON:  None Available. FINDINGS: There is an acute transcervical fracture of the right femoral neck with associated impaction and superior displacement of the distal segment measuring approximately 2 cm. No additional fracture identified. The left hip joint is anatomically aligned with degenerative changes. IMPRESSION: Acute, impacted, and displaced right femoral neck transcervical fracture. Electronically Signed   By: Hart Robinsons M.D.   On: 04/01/2023 12:25    Pending Labs Unresulted Labs (From admission, onward)     Start     Ordered   04/02/23 0500  Basic metabolic panel  Tomorrow morning,   R        04/01/23 1131   04/02/23 0500  CBC  Tomorrow morning,   R        04/01/23 1131            Vitals/Pain Today's Vitals   04/01/23 0941 04/01/23 0943 04/01/23 1046  BP: (!) 154/84    Pulse: 63    Resp: 16    Temp: 97.7 F (36.5  C)    TempSrc: Oral    SpO2: 97%    Weight:  73 kg   Height:  5\' 8"  (1.727 m)   PainSc:  7  4     Isolation Precautions No active isolations  Medications Medications  aspirin EC tablet 81 mg (has no administration in time range)  atorvastatin (LIPITOR) tablet 80 mg (has no administration in time range)  carvedilol (COREG) tablet 3.125 mg (has no administration in time range)  amLODipine (NORVASC) tablet 10 mg (has no administration in time range)  pantoprazole (PROTONIX) EC tablet 40 mg (has no administration in time range)  dorzolamide-timolol (COSOPT) 2-0.5 % ophthalmic solution 1 drop (has no administration in time range)  trimethoprim-polymyxin b (POLYTRIM) ophthalmic solution 1 drop (has no administration in time range)  enoxaparin (LOVENOX) injection 40 mg (has no administration in time range)  acetaminophen (TYLENOL) tablet 650 mg (has no administration in time range)    Or  acetaminophen (TYLENOL) suppository 650 mg (has no administration in time range)  oxyCODONE (Oxy IR/ROXICODONE) immediate release tablet 5 mg (has no administration in time range)  morphine (PF) 2 MG/ML injection 2 mg (has no administration in time range)  ondansetron (ZOFRAN) tablet 4 mg (has no administration in time range)    Or  ondansetron (ZOFRAN) injection 4 mg (has no administration in time range)  senna-docusate (Senokot-S) tablet 1 tablet (has no administration in time range)  albuterol (PROVENTIL) (2.5 MG/3ML) 0.083% nebulizer solution 2.5 mg (has no administration in time range)  morphine (PF) 4 MG/ML injection 4 mg (4 mg Intravenous Given 04/01/23 0959)  famotidine (PEPCID) tablet 20 mg (20 mg Oral Given 04/01/23 1109)  alum & mag hydroxide-simeth (MAALOX/MYLANTA) 200-200-20 MG/5ML suspension 30 mL (30 mLs Oral Given 04/01/23 1109)    Mobility walks     Focused Assessments   R Recommendations: See Admitting Provider Note  Report given to:   Additional Notes:

## 2023-04-02 ENCOUNTER — Encounter (HOSPITAL_COMMUNITY)
Admission: EM | Disposition: A | Discharge status: Home Health | Payer: Self-pay | Source: Home / Self Care | Attending: Internal Medicine

## 2023-04-02 ENCOUNTER — Inpatient Hospital Stay (HOSPITAL_COMMUNITY): Payer: Medicare Other

## 2023-04-02 ENCOUNTER — Inpatient Hospital Stay (HOSPITAL_COMMUNITY): Payer: Medicare Other | Admitting: Certified Registered Nurse Anesthetist

## 2023-04-02 ENCOUNTER — Encounter (HOSPITAL_COMMUNITY): Payer: Self-pay | Admitting: Internal Medicine

## 2023-04-02 ENCOUNTER — Ambulatory Visit: Payer: Self-pay | Admitting: Student

## 2023-04-02 ENCOUNTER — Other Ambulatory Visit: Payer: Self-pay

## 2023-04-02 DIAGNOSIS — S72001A Fracture of unspecified part of neck of right femur, initial encounter for closed fracture: Secondary | ICD-10-CM

## 2023-04-02 DIAGNOSIS — I2510 Atherosclerotic heart disease of native coronary artery without angina pectoris: Secondary | ICD-10-CM

## 2023-04-02 HISTORY — PX: TOTAL HIP ARTHROPLASTY: SHX124

## 2023-04-02 LAB — TYPE AND SCREEN
ABO/RH(D): A POS
Antibody Screen: NEGATIVE

## 2023-04-02 LAB — BASIC METABOLIC PANEL
Anion gap: 8 (ref 5–15)
BUN: 13 mg/dL (ref 8–23)
CO2: 29 mmol/L (ref 22–32)
Calcium: 8.5 mg/dL — ABNORMAL LOW (ref 8.9–10.3)
Chloride: 98 mmol/L (ref 98–111)
Creatinine, Ser: 0.75 mg/dL (ref 0.61–1.24)
GFR, Estimated: >60 mL/min (ref >60)
Glucose, Bld: 109 mg/dL — ABNORMAL HIGH (ref 70–99)
Potassium: 3.4 mmol/L — ABNORMAL LOW (ref 3.5–5.1)
Sodium: 135 mmol/L (ref 135–145)

## 2023-04-02 LAB — CBC
HCT: 40.3 % (ref 39.0–52.0)
Hemoglobin: 13.8 g/dL (ref 13.0–17.0)
MCH: 34.2 pg — ABNORMAL HIGH (ref 26.0–34.0)
MCHC: 34.2 g/dL (ref 30.0–36.0)
MCV: 99.8 fL (ref 80.0–100.0)
Platelets: 213 10*3/uL (ref 150–400)
RBC: 4.04 MIL/uL — ABNORMAL LOW (ref 4.22–5.81)
RDW: 13 % (ref 11.5–15.5)
WBC: 10.3 10*3/uL (ref 4.0–10.5)
nRBC: 0 % (ref 0.0–0.2)

## 2023-04-02 LAB — ABO/RH: ABO/RH(D): A POS

## 2023-04-02 SURGERY — ARTHROPLASTY, HIP, TOTAL, ANTERIOR APPROACH
Anesthesia: Spinal | Site: Hip | Laterality: Right

## 2023-04-02 MED ORDER — POTASSIUM CHLORIDE CRYS ER 20 MEQ PO TBCR: 40.0000 meq | EXTENDED_RELEASE_TABLET | Freq: Once | ORAL | Status: DC

## 2023-04-02 MED ORDER — KETOROLAC TROMETHAMINE 30 MG/ML IJ SOLN
INTRAMUSCULAR | Status: DC | PRN
  Administered 2023-04-02: 30 mg

## 2023-04-02 MED ORDER — OXYCODONE HCL 5 MG PO TABS: 5.0000 mg | ORAL_TABLET | Freq: Once | ORAL | Status: DC | PRN

## 2023-04-02 MED ORDER — DEXAMETHASONE SODIUM PHOSPHATE 10 MG/ML IJ SOLN
INTRAMUSCULAR | Status: DC | PRN
  Administered 2023-04-02: 10 mg via INTRAVENOUS

## 2023-04-02 MED ORDER — BISACODYL 10 MG RE SUPP: 10.0000 mg | Freq: Every day | RECTAL | Status: DC | PRN

## 2023-04-02 MED ORDER — CHLORHEXIDINE GLUCONATE 0.12 % MT SOLN
15.0000 mL | Freq: Once | OROMUCOSAL | Status: AC
  Administered 2023-04-02: 15 mL via OROMUCOSAL

## 2023-04-02 MED ORDER — PROPOFOL 10 MG/ML IV BOLUS
INTRAVENOUS | Status: DC | PRN
Start: 2023-04-02 — End: 2023-04-02
  Administered 2023-04-02: 30 mg via INTRAVENOUS

## 2023-04-02 MED ORDER — EPHEDRINE SULFATE-NACL 50-0.9 MG/10ML-% IV SOSY
PREFILLED_SYRINGE | INTRAVENOUS | Status: DC | PRN
Start: 2023-04-02 — End: 2023-04-02
  Administered 2023-04-02 (×2): 5 mg via INTRAVENOUS
  Administered 2023-04-02: 2.5 mg via INTRAVENOUS

## 2023-04-02 MED ORDER — SENNA 8.6 MG PO TABS
1.0000 | ORAL_TABLET | Freq: Two times a day (BID) | ORAL | Status: DC
  Administered 2023-04-02 – 2023-04-03 (×2): 8.6 mg via ORAL
  Filled 2023-04-02 (×2): qty 1

## 2023-04-02 MED ORDER — BUPIVACAINE-EPINEPHRINE 0.25% -1:200000 IJ SOLN
INTRAMUSCULAR | Status: AC
  Filled 2023-04-02: qty 1

## 2023-04-02 MED ORDER — HYDROCODONE-ACETAMINOPHEN 7.5-325 MG PO TABS: 1.0000 | ORAL_TABLET | ORAL | Status: DC | PRN

## 2023-04-02 MED ORDER — PROPOFOL 1000 MG/100ML IV EMUL
INTRAVENOUS | Status: AC
  Filled 2023-04-02: qty 100

## 2023-04-02 MED ORDER — BUPIVACAINE IN DEXTROSE 0.75-8.25 % IT SOLN
INTRATHECAL | Status: DC | PRN
  Administered 2023-04-02: 2 mL via INTRATHECAL

## 2023-04-02 MED ORDER — ISOPROPYL ALCOHOL 70 % SOLN
Status: DC | PRN
  Administered 2023-04-02: 1 via TOPICAL

## 2023-04-02 MED ORDER — POLYETHYLENE GLYCOL 3350 17 G PO PACK: 17.0000 g | PACK | Freq: Every day | ORAL | Status: DC | PRN

## 2023-04-02 MED ORDER — TRANEXAMIC ACID-NACL 1000-0.7 MG/100ML-% IV SOLN
1000.0000 mg | INTRAVENOUS | Status: AC
  Administered 2023-04-02: 1000 mg via INTRAVENOUS

## 2023-04-02 MED ORDER — ACETAMINOPHEN 500 MG PO TABS: 1000.0000 mg | ORAL_TABLET | Freq: Once | ORAL | Status: AC

## 2023-04-02 MED ORDER — ASPIRIN 325 MG PO TBEC
325.0000 mg | DELAYED_RELEASE_TABLET | Freq: Every day | ORAL | Status: DC
  Administered 2023-04-02 – 2023-04-03 (×2): 325 mg via ORAL
  Filled 2023-04-02 (×2): qty 1

## 2023-04-02 MED ORDER — LACTATED RINGERS IV SOLN: INTRAVENOUS | Status: DC

## 2023-04-02 MED ORDER — SODIUM CHLORIDE (PF) 0.9 % IJ SOLN
INTRAMUSCULAR | Status: AC
  Filled 2023-04-02: qty 50

## 2023-04-02 MED ORDER — ONDANSETRON HCL 4 MG/2ML IJ SOLN
INTRAMUSCULAR | Status: DC | PRN
  Administered 2023-04-02: 4 mg via INTRAVENOUS

## 2023-04-02 MED ORDER — MORPHINE SULFATE (PF) 2 MG/ML IV SOLN: 0.5000 mg | INTRAVENOUS | Status: DC | PRN

## 2023-04-02 MED ORDER — METHOCARBAMOL 1000 MG/10ML IJ SOLN: 500.0000 mg | Freq: Four times a day (QID) | INTRAVENOUS | Status: DC | PRN

## 2023-04-02 MED ORDER — PROPOFOL 500 MG/50ML IV EMUL
INTRAVENOUS | Status: DC | PRN
  Administered 2023-04-02: 50 ug/kg/min via INTRAVENOUS

## 2023-04-02 MED ORDER — POVIDONE-IODINE 10 % EX SWAB: 2.0000 | Freq: Once | CUTANEOUS | Status: DC

## 2023-04-02 MED ORDER — ACETAMINOPHEN 500 MG PO TABS
ORAL_TABLET | ORAL | Status: AC
  Administered 2023-04-02: 1000 mg via ORAL
  Filled 2023-04-02: qty 2

## 2023-04-02 MED ORDER — FENTANYL CITRATE (PF) 100 MCG/2ML IJ SOLN
INTRAMUSCULAR | Status: AC
  Filled 2023-04-02: qty 2

## 2023-04-02 MED ORDER — MENTHOL 3 MG MT LOZG: 1.0000 | LOZENGE | OROMUCOSAL | Status: DC | PRN

## 2023-04-02 MED ORDER — CEFAZOLIN SODIUM-DEXTROSE 2-4 GM/100ML-% IV SOLN
INTRAVENOUS | Status: AC
  Filled 2023-04-02: qty 100

## 2023-04-02 MED ORDER — OXYCODONE HCL 5 MG/5ML PO SOLN: 5.0000 mg | Freq: Once | ORAL | Status: DC | PRN

## 2023-04-02 MED ORDER — DEXAMETHASONE SODIUM PHOSPHATE 10 MG/ML IJ SOLN
INTRAMUSCULAR | Status: AC
  Filled 2023-04-02: qty 1

## 2023-04-02 MED ORDER — BUPIVACAINE-EPINEPHRINE 0.25% -1:200000 IJ SOLN
INTRAMUSCULAR | Status: DC | PRN
  Administered 2023-04-02: 30 mL

## 2023-04-02 MED ORDER — PROPOFOL 10 MG/ML IV BOLUS
INTRAVENOUS | Status: AC
  Filled 2023-04-02: qty 20

## 2023-04-02 MED ORDER — CEFAZOLIN SODIUM-DEXTROSE 2-4 GM/100ML-% IV SOLN
2.0000 g | INTRAVENOUS | Status: AC
  Administered 2023-04-02: 2 g via INTRAVENOUS

## 2023-04-02 MED ORDER — TRANEXAMIC ACID-NACL 1000-0.7 MG/100ML-% IV SOLN
INTRAVENOUS | Status: AC
  Filled 2023-04-02: qty 100

## 2023-04-02 MED ORDER — SODIUM CHLORIDE (PF) 0.9 % IJ SOLN
INTRAMUSCULAR | Status: DC | PRN
  Administered 2023-04-02: 30 mL

## 2023-04-02 MED ORDER — FENTANYL CITRATE (PF) 100 MCG/2ML IJ SOLN
INTRAMUSCULAR | Status: DC | PRN
  Administered 2023-04-02: 50 ug via INTRAVENOUS

## 2023-04-02 MED ORDER — HYDROMORPHONE HCL 1 MG/ML IJ SOLN: 0.2500 mg | INTRAMUSCULAR | Status: DC | PRN

## 2023-04-02 MED ORDER — METOCLOPRAMIDE HCL 5 MG PO TABS: 5.0000 mg | ORAL_TABLET | Freq: Three times a day (TID) | ORAL | Status: DC | PRN

## 2023-04-02 MED ORDER — ALUM & MAG HYDROXIDE-SIMETH 200-200-20 MG/5ML PO SUSP: 30.0000 mL | ORAL | Status: DC | PRN

## 2023-04-02 MED ORDER — ONDANSETRON HCL 4 MG/2ML IJ SOLN
INTRAMUSCULAR | Status: AC
  Filled 2023-04-02: qty 2

## 2023-04-02 MED ORDER — CHLORHEXIDINE GLUCONATE 4 % EX SOLN: 60.0000 mL | Freq: Once | CUTANEOUS | Status: DC

## 2023-04-02 MED ORDER — DOCUSATE SODIUM 100 MG PO CAPS
100.0000 mg | ORAL_CAPSULE | Freq: Two times a day (BID) | ORAL | Status: DC
  Administered 2023-04-02 – 2023-04-03 (×2): 100 mg via ORAL
  Filled 2023-04-02 (×2): qty 1

## 2023-04-02 MED ORDER — SODIUM CHLORIDE 0.9 % IR SOLN
Status: DC | PRN
  Administered 2023-04-02: 1000 mL

## 2023-04-02 MED ORDER — KETOROLAC TROMETHAMINE 30 MG/ML IJ SOLN
INTRAMUSCULAR | Status: AC
  Filled 2023-04-02: qty 1

## 2023-04-02 MED ORDER — ORAL CARE MOUTH RINSE: 15.0000 mL | Freq: Once | OROMUCOSAL | Status: AC

## 2023-04-02 MED ORDER — CEFAZOLIN SODIUM-DEXTROSE 2-4 GM/100ML-% IV SOLN
2.0000 g | Freq: Four times a day (QID) | INTRAVENOUS | Status: AC
  Administered 2023-04-02 – 2023-04-03 (×2): 2 g via INTRAVENOUS
  Filled 2023-04-02 (×2): qty 100

## 2023-04-02 MED ORDER — ISOPROPYL ALCOHOL 70 % SOLN
Status: AC
  Filled 2023-04-02: qty 480

## 2023-04-02 MED ORDER — METHOCARBAMOL 500 MG PO TABS: 500.0000 mg | ORAL_TABLET | Freq: Four times a day (QID) | ORAL | Status: DC | PRN

## 2023-04-02 MED ORDER — PHENYLEPHRINE HCL-NACL 20-0.9 MG/250ML-% IV SOLN
INTRAVENOUS | Status: DC | PRN
Start: 2023-04-02 — End: 2023-04-02
  Administered 2023-04-02: 20 ug/min via INTRAVENOUS

## 2023-04-02 MED ORDER — AMISULPRIDE (ANTIEMETIC) 5 MG/2ML IV SOLN: 10.0000 mg | Freq: Once | INTRAVENOUS | Status: DC | PRN

## 2023-04-02 MED ORDER — METOCLOPRAMIDE HCL 5 MG/ML IJ SOLN: 5.0000 mg | Freq: Three times a day (TID) | INTRAMUSCULAR | Status: DC | PRN

## 2023-04-02 MED ORDER — WATER FOR IRRIGATION, STERILE IR SOLN
Status: DC | PRN
  Administered 2023-04-02: 1000 mL

## 2023-04-02 MED ORDER — HYDROCODONE-ACETAMINOPHEN 5-325 MG PO TABS
1.0000 | ORAL_TABLET | ORAL | Status: DC | PRN
  Administered 2023-04-03: 1 via ORAL
  Filled 2023-04-02: qty 1

## 2023-04-02 MED ORDER — PHENOL 1.4 % MT LIQD: 1.0000 | OROMUCOSAL | Status: DC | PRN

## 2023-04-02 SURGICAL SUPPLY — 55 items
ADH SKN CLS APL DERMABOND .7 (GAUZE/BANDAGES/DRESSINGS) ×1
APL PRP STRL LF DISP 70% ISPRP (MISCELLANEOUS) ×1
BAG COUNTER SPONGE SURGICOUNT (BAG) IMPLANT
BAG SPEC THK2 15X12 ZIP CLS (MISCELLANEOUS)
BAG SPNG CNTER NS LX DISP (BAG)
BAG ZIPLOCK 12X15 (MISCELLANEOUS) IMPLANT
CHLORAPREP W/TINT 26 (MISCELLANEOUS) ×2 IMPLANT
COVER PERINEAL POST (MISCELLANEOUS) ×2 IMPLANT
COVER SURGICAL LIGHT HANDLE (MISCELLANEOUS) ×2 IMPLANT
DERMABOND ADVANCED .7 DNX12 (GAUZE/BANDAGES/DRESSINGS) ×4 IMPLANT
DRAPE IMP U-DRAPE 54X76 (DRAPES) ×2 IMPLANT
DRAPE SHEET LG 3/4 BI-LAMINATE (DRAPES) ×6 IMPLANT
DRAPE STERI IOBAN 125X83 (DRAPES) ×2 IMPLANT
DRAPE U-SHAPE 47X51 STRL (DRAPES) ×2 IMPLANT
DRSG AQUACEL AG ADV 3.5X10 (GAUZE/BANDAGES/DRESSINGS) ×2 IMPLANT
ELECT REM PT RETURN 15FT ADLT (MISCELLANEOUS) ×2 IMPLANT
GAUZE SPONGE 4X4 12PLY STRL (GAUZE/BANDAGES/DRESSINGS) ×2 IMPLANT
GLOVE BIO SURGEON STRL SZ7 (GLOVE) ×2 IMPLANT
GLOVE BIO SURGEON STRL SZ8.5 (GLOVE) ×4 IMPLANT
GLOVE BIOGEL PI IND STRL 7.5 (GLOVE) ×2 IMPLANT
GLOVE BIOGEL PI IND STRL 8.5 (GLOVE) ×2 IMPLANT
GOWN SPEC L3 XXLG W/TWL (GOWN DISPOSABLE) ×2 IMPLANT
GOWN STRL REUS W/ TWL XL LVL3 (GOWN DISPOSABLE) ×2 IMPLANT
GOWN STRL REUS W/TWL XL LVL3 (GOWN DISPOSABLE) ×1
HANDPIECE INTERPULSE COAX TIP (DISPOSABLE) ×1
HEAD FEM -6XOFST 36XMDLR (Orthopedic Implant) IMPLANT
HEAD MODULAR 36MM (Orthopedic Implant) ×1 IMPLANT
HOLDER FOLEY CATH W/STRAP (MISCELLANEOUS) ×2 IMPLANT
HOOD PEEL AWAY T7 (MISCELLANEOUS) ×6 IMPLANT
KIT TURNOVER KIT A (KITS) IMPLANT
LINER ACETAB NN G7 F 36 (Liner) IMPLANT
MANIFOLD NEPTUNE II (INSTRUMENTS) ×2 IMPLANT
MARKER SKIN DUAL TIP RULER LAB (MISCELLANEOUS) ×2 IMPLANT
NDL SAFETY ECLIPSE 18X1.5 (NEEDLE) ×2 IMPLANT
NDL SPNL 18GX3.5 QUINCKE PK (NEEDLE) ×2 IMPLANT
NEEDLE SPNL 18GX3.5 QUINCKE PK (NEEDLE) ×1 IMPLANT
PACK ANTERIOR HIP CUSTOM (KITS) ×2 IMPLANT
SAW OSC TIP CART 19.5X105X1.3 (SAW) ×2 IMPLANT
SEALER BIPOLAR AQUA 6.0 (INSTRUMENTS) ×2 IMPLANT
SET HNDPC FAN SPRY TIP SCT (DISPOSABLE) ×2 IMPLANT
SHELL ACET G7 4H 56 SZF (Shell) IMPLANT
SOLUTION PRONTOSAN WOUND 350ML (IRRIGATION / IRRIGATOR) ×2 IMPLANT
SPIKE FLUID TRANSFER (MISCELLANEOUS) ×2 IMPLANT
STEM FEM CMTLS 18X156 133D (Stem) IMPLANT
SUT MNCRL AB 3-0 PS2 18 (SUTURE) ×2 IMPLANT
SUT MON AB 2-0 CT1 36 (SUTURE) ×2 IMPLANT
SUT STRATAFIX PDO 1 14 VIOLET (SUTURE) ×1
SUT STRATFX PDO 1 14 VIOLET (SUTURE) ×1
SUT VIC AB 2-0 CT1 27 (SUTURE)
SUT VIC AB 2-0 CT1 TAPERPNT 27 (SUTURE) IMPLANT
SUTURE STRATFX PDO 1 14 VIOLET (SUTURE) ×2 IMPLANT
SYR 3ML LL SCALE MARK (SYRINGE) ×2 IMPLANT
TRAY FOLEY MTR SLVR 16FR STAT (SET/KITS/TRAYS/PACK) IMPLANT
TUBE SUCTION HIGH CAP CLEAR NV (SUCTIONS) ×2 IMPLANT
WATER STERILE IRR 1000ML POUR (IV SOLUTION) ×2 IMPLANT

## 2023-04-02 NOTE — TOC Initial Note (Signed)
Transition of Care [facility]) - Initial/Assessment Note   Patient Details  Name: Kenneth Pope MRN: [id number] Date of Birth: [birth year]  [facility]) CM/SW Contact:    Ewing Schlein, LCSW Phone Number: 04/02/2023, 10:39 AM  Clinical Narrative: Patient is from independent living at [facility] where he resides with his wife. Surgery scheduled for today. Awaiting PT evaluation following surgery. CSW confirmed with [place] in admissions at [facility] that the facility can take him if SNF is recommended.                 Expected Discharge Plan:  (TBD) Barriers to Discharge: Continued Medical Work up  Expected Discharge Plan and Services In-house Referral: Clinical Social Work Living arrangements for the past 2 months: Independent Living Facility  Prior Living Arrangements/Services Living arrangements for the past 2 months: Independent Living Facility Lives with:: Spouse Patient language and need for interpreter reviewed:: Yes Do you feel safe going back to the place where you live?: Yes      Need for Family Participation in Patient Care: No (Comment) Care giver support system in place?: Yes (comment) Criminal Activity/Legal Involvement Pertinent to Current Situation/Hospitalization: No - Comment as needed  Activities of Daily Living ADL Screening (condition at time of admission) Independently performs ADLs?: Yes (appropriate for developmental age) Is the patient deaf or have difficulty hearing?: No Does the patient have difficulty seeing, even when wearing glasses/contacts?: No Does the patient have difficulty concentrating, remembering, or making decisions?: No  Emotional Assessment Orientation: : Oriented to Self, Oriented to Place, Oriented to  Time, Oriented to Situation Alcohol / Substance Use: Not Applicable Psych Involvement: No (comment)  Admission diagnosis:  Closed right hip fracture (HCC) [S72.001A] Closed right hip fracture, initial encounter (HCC)  [S72.001A] Patient Active Problem List   Diagnosis Date Noted   Closed right hip fracture (HCC) 04/01/2023   Leg swelling 01/16/2020   Coronary artery disease involving native coronary artery of native heart without angina pectoris 01/16/2020   NSTEMI (non-ST elevation myocardial infarction) (HCC) 05/05/2018   Elevated troponin    Syncope 05/04/2018   Essential hypertension 05/04/2018   Dyslipidemia 05/04/2018   PCP:  Adrian Prince, MD Pharmacy:   [id number] [place], [place] - [address] AT [facility] [address] [place] [address] Phone: [phone] Fax: [phone]  Social Determinants of Health (SDOH) Social History: SDOH Screenings   Food Insecurity: No Food Insecurity (04/01/2023)  Housing: Low Risk  (04/01/2023)  Transportation Needs: No Transportation Needs (04/01/2023)  Utilities: Not At Risk (04/01/2023)  Depression (PHQ2-9): Low Risk  (08/31/2018)  Financial Resource Strain: Low Risk  (06/16/2018)  Physical Activity: Insufficiently Active (06/16/2018)  Stress: Stress Concern Present (06/16/2018)  Tobacco Use: Medium Risk (04/01/2023)   SDOH Interventions:    Readmission Risk Interventions     No data to display

## 2023-04-02 NOTE — Transfer of Care (Signed)
Immediate Anesthesia Transfer of Care Note  Patient: Kenneth Pope  Procedure(s) Performed: TOTAL HIP ARTHROPLASTY ANTERIOR APPROACH VS HEMI (Right: Hip)  Patient Location: PACU  Anesthesia Type:Spinal  Level of Consciousness: drowsy and patient cooperative  Airway & Oxygen Therapy: Patient Spontanous Breathing and Patient connected to nasal cannula oxygen  Post-op Assessment: Report given to RN and Post -op Vital signs reviewed and stable  Post vital signs: Reviewed and stable  Last Vitals:  Vitals Value Taken Time  BP 107/62 04/02/23 1843  Temp    Pulse 57 04/02/23 1845  Resp 13 04/02/23 1845  SpO2 98 % 04/02/23 1845  Vitals shown include unfiled device data.  Last Pain:  Vitals:   04/02/23 1308  TempSrc: Oral  PainSc:       Patients Stated Pain Goal: 0 (04/02/23 0803)  Complications: No notable events documented.

## 2023-04-02 NOTE — Anesthesia Procedure Notes (Signed)
Procedure Name: MAC Date/Time: 04/02/2023 4:47 PM  Performed by: Maurene Capes, CRNAPre-anesthesia Checklist: Patient identified, Emergency Drugs available, Suction available and Patient being monitored Patient Re-evaluated:Patient Re-evaluated prior to induction Oxygen Delivery Method: Nasal cannula Preoxygenation: Pre-oxygenation with 100% oxygen Induction Type: IV induction Placement Confirmation: positive ETCO2 Dental Injury: Teeth and Oropharynx as per pre-operative assessment

## 2023-04-02 NOTE — Anesthesia Preprocedure Evaluation (Addendum)
Anesthesia Evaluation  Patient identified by MRN, date of birth, ID band Patient awake    Reviewed: Allergy & Precautions, NPO status , Patient's Chart, lab work & pertinent test results, reviewed documented beta blocker date and time   Airway Mallampati: II  TM Distance: >3 FB     Dental  (+) Teeth Intact, Caps, Dental Advisory Given   Pulmonary former smoker   Pulmonary exam normal breath sounds clear to auscultation       Cardiovascular hypertension, Pt. on medications and Pt. on home beta blockers + CAD, + Past MI and + Cardiac Stents  Normal cardiovascular exam Rhythm:Regular Rate:Normal  Cardiac Cath 04/1918  Prox RCA lesion is 85% stenosed.  Mid RCA lesion is 50% stenosed.  Prox LAD lesion is 50% stenosed.  Post intervention, there is a 0% residual stenosis.  A stent was successfully placed.   Two-vessel coronary obstructive disease with a very focal 50% stenosis immediately after the takeoff of the proximal diagonal vessel in the LAD slightly improved following IC nitroglycerin administration, and a large dominant RCA with acute angle shepherd's crook takeoff with 85% stenosis in the proximal bend followed by 50% smooth stenosis further down in the proximal-mid vessel.   LVEDP 13 mm.   Successful PCI to the proximal RCA with ultimate insertion of a 3.0 x 15 mm Xience DES stent postdilated to 3.5 mm with the 85% stenosis being reduced to 0%.   RECOMMENDATION: Increase medical therapy for concomitant CAD and high potency statin therapy; will change to atorvastatin 80 mg daily.  The patient is in need for dental extraction which had been tentatively planned for tomorrow.  This will need to be cancelled and deferrered.   In light of his age and bleed risk, he may be a candidate for participation in the Xience 28 study with reduction in length of DAPT therapy.      Neuro/Psych negative neurological ROS  negative psych  ROS   GI/Hepatic negative GI ROS, Neg liver ROS,,,  Endo/Other  negative endocrine ROS    Renal/GU negative Renal ROS  negative genitourinary   Musculoskeletal  (+) Arthritis , Osteoarthritis,  Fx right hip   Abdominal   Peds  Hematology negative hematology ROS (+)   Anesthesia Other Findings   Reproductive/Obstetrics                             Anesthesia Physical Anesthesia Plan  ASA: 3  Anesthesia Plan: Spinal   Post-op Pain Management: Dilaudid IV   Induction: Intravenous  PONV Risk Score and Plan: Treatment may vary due to age or medical condition and Propofol infusion  Airway Management Planned: Natural Airway and Simple Face Mask  Additional Equipment: None  Intra-op Plan:   Post-operative Plan:   Informed Consent: I have reviewed the patients History and Physical, chart, labs and discussed the procedure including the risks, benefits and alternatives for the proposed anesthesia with the patient or authorized representative who has indicated his/her understanding and acceptance.     Dental advisory given  Plan Discussed with: Anesthesiologist and CRNA  Anesthesia Plan Comments:         Anesthesia Quick Evaluation

## 2023-04-02 NOTE — Anesthesia Postprocedure Evaluation (Signed)
Anesthesia Post Note  Patient: Kenneth Pope  Procedure(s) Performed: TOTAL HIP ARTHROPLASTY ANTERIOR APPROACH VS HEMI (Right: Hip)     Patient location during evaluation: PACU Anesthesia Type: Spinal Level of consciousness: oriented and awake and alert Pain management: pain level controlled Vital Signs Assessment: post-procedure vital signs reviewed and stable Respiratory status: spontaneous breathing, respiratory function stable and nonlabored ventilation Cardiovascular status: blood pressure returned to baseline and stable Postop Assessment: no headache, no backache, no apparent nausea or vomiting, spinal receding and patient able to bend at knees Anesthetic complications: no   No notable events documented.  Last Vitals:  Vitals:   04/02/23 1900 04/02/23 1915  BP: 105/65 109/64  Pulse: 60 60  Resp: (!) 21 17  Temp:    SpO2: 99% 95%    Last Pain:  Vitals:   04/02/23 1915  TempSrc:   PainSc: 0-No pain    LLE Motor Response: Purposeful movement (04/02/23 1915) LLE Sensation: Decreased (04/02/23 1915) RLE Motor Response: Purposeful movement (04/02/23 1915) RLE Sensation: Decreased (04/02/23 1915) L Sensory Level: L4-Anterior knee, lower leg (04/02/23 1915) R Sensory Level: L4-Anterior knee, lower leg (04/02/23 1915)  [place] Glymph A.

## 2023-04-02 NOTE — Progress Notes (Signed)
CCC Pre-op Review 1.Surgical orders: Consent orders:  Completed Consent signed:  Completed Pre-op meds:  Tylenol 1000 OCTOR Ancef 2G PCTOR Profend  TXA  2.  Pre-procedure checklist completed: Completed ny floor RN  3.  NPO: MN  4.  CHG Bath completed: Completed      Belongings removed and placed in clean gown:  Completed  5.  Labs Performed: CBC    Abnormal     CMP/BMP   Abnormal PT/INR  NA HA1C   NA Type and Screen Order placed to be collected Surgical PCR:  04/01/23 Staph positive Pregnancy:  NA EKG:    04/01/23 Chest x-ray:  12/04/22   6.  Recent H&P or progress note if inpatient:  04/02/23  7.  Language Barrier:  None  8.  Vital Signs:  Stable Oxygen:  Room Air Tele:   NA  9.  Medications: Cardiac Drips:  NA Pain Medications: Morphine 2mg  IV 0616 Beta Blocker:   Carvedilol 3.125mg  0805 Anticoagulants:  ASA 81mg  po 0804 GLP1:   NA  10. IV access:   20G Right FA  11. Diabetic:     CBG:      @      Perioperative Glycemic Control Scale:    Moderate or Sensitive

## 2023-04-02 NOTE — Anesthesia Procedure Notes (Signed)
Spinal  Patient location during procedure: OR Start time: 04/02/2023 4:50 PM End time: 04/02/2023 4:54 PM Reason for block: surgical anesthesia Staffing Performed: anesthesiologist  Anesthesiologist: Mal Amabile, MD Performed by: Mal Amabile, MD Authorized by: Mal Amabile, MD   Preanesthetic Checklist Completed: patient identified, IV checked, site marked, risks and benefits discussed, surgical consent, monitors and equipment checked, pre-op evaluation and timeout performed Spinal Block Patient position: sitting Prep: DuraPrep and site prepped and draped Patient monitoring: heart rate, cardiac monitor, continuous pulse ox and blood pressure Approach: midline Location: L3-4 Injection technique: single-shot Needle Needle type: Pencan  Needle gauge: 24 G Needle length: 9 cm Needle insertion depth: 7 cm Assessment Sensory level: T4 Events: CSF return Additional Notes Patient tolerated procedure well. Adequate sensory level.

## 2023-04-02 NOTE — Anesthesia Procedure Notes (Signed)
Date/Time: 04/02/2023 5:14 PM  Performed by: Maurene Capes, CRNAOxygen Delivery Method: Simple face mask Placement Confirmation: positive ETCO2 Dental Injury: Teeth and Oropharynx as per pre-operative assessment

## 2023-04-02 NOTE — Progress Notes (Signed)
PROGRESS NOTE    Kenneth Pope  ZOX:[id number] DOB: [birth year] DOA: 04/01/2023 PCP: Adrian Prince, MD   Brief Narrative:  89 y.o. male with medical history significant for hypertension, hyperlipidemia, CAD with stent to RCA in 2019 on daily aspirin presented with a mechanical fall and was found to have right hip fracture.  Orthopedics has been consulted.  Assessment & Plan:   Closed right hip fracture after a mechanical fall -Continue pain management.  Fall precautions.  Orthopedics following and planning for surgical intervention today.  Keep NPO.  History of CAD Hyperlipidemia Hypertension -Stable.  Continue aspirin, statin, amlodipine and Coreg.  Hypokalemia -Replace  DVT prophylaxis: Lovenox Code Status: Full Family Communication: None at bedside Disposition Plan: Status is: Inpatient Remains inpatient appropriate because: Of severity of illness.  Need for surgical intervention.  Consultants: Orthopedics  Procedures: None  Antimicrobials: None   Subjective: Patient seen and examined at bedside.  Complains of intermittent right hip pain.  No fever, vomiting, chest pain reported.  Objective: Vitals:   04/01/23 1747 04/01/23 2127 04/02/23 0231 04/02/23 0546  BP: 138/72 124/73 124/75 128/73  Pulse: 70 70 66 66  Resp:  18 19 19   Temp:  98.5 F (36.9 C) 97.9 F (36.6 C) 98.1 F (36.7 C)  TempSrc:      SpO2:  93% 91% 90%  Weight:      Height:        Intake/Output Summary (Last 24 hours) at 04/02/2023 1017 Last data filed at 04/02/2023 0600 Gross per 24 hour  Intake 240 ml  Output 550 ml  Net -310 ml   Filed Weights   04/01/23 0943  Weight: 73 kg    Examination:  General exam: Appears calm and comfortable.  Elderly male lying in bed.  On room air. Respiratory system: Bilateral decreased breath sounds at bases with scattered crackles Cardiovascular system: S1 & S2 heard, Rate controlled Gastrointestinal system: Abdomen is nondistended, soft  and nontender. Normal bowel sounds heard. Extremities: No cyanosis, clubbing, edema    Data Reviewed: I have personally reviewed following labs and imaging studies  CBC: Recent Labs  Lab 04/01/23 0949 04/02/23 0327  WBC 8.7 10.3  NEUTROABS 5.1  --   HGB 14.4 13.8  HCT 41.7 40.3  MCV 99.5 99.8  PLT 210 213   Basic Metabolic Panel: Recent Labs  Lab 04/01/23 0949 04/02/23 0327  NA 135 135  K 3.8 3.4*  CL 103 98  CO2 24 29  GLUCOSE 134* 109*  BUN 13 13  CREATININE 0.65 0.75  CALCIUM 8.7* 8.5*   GFR: Estimated Creatinine Clearance: 62.9 mL/min (by C-G formula based on SCr of 0.75 mg/dL). Liver Function Tests: No results for input(s): "AST", "ALT", "ALKPHOS", "BILITOT", "PROT", "ALBUMIN" in the last 168 hours. No results for input(s): "LIPASE", "AMYLASE" in the last 168 hours. No results for input(s): "AMMONIA" in the last 168 hours. Coagulation Profile: No results for input(s): "INR", "PROTIME" in the last 168 hours. Cardiac Enzymes: No results for input(s): "CKTOTAL", "CKMB", "CKMBINDEX", "TROPONINI" in the last 168 hours. BNP (last 3 results) No results for input(s): "PROBNP" in the last 8760 hours. HbA1C: No results for input(s): "HGBA1C" in the last 72 hours. CBG: No results for input(s): "GLUCAP" in the last 168 hours. Lipid Profile: No results for input(s): "CHOL", "HDL", "LDLCALC", "TRIG", "CHOLHDL", "LDLDIRECT" in the last 72 hours. Thyroid Function Tests: No results for input(s): "TSH", "T4TOTAL", "FREET4", "T3FREE", "THYROIDAB" in the last 72 hours. Anemia Panel: No results for input(s): "  VITAMINB12", "FOLATE", "FERRITIN", "TIBC", "IRON", "RETICCTPCT" in the last 72 hours. Sepsis Labs: No results for input(s): "PROCALCITON", "LATICACIDVEN" in the last 168 hours.  Recent Results (from the past 240 hour(s))  Surgical pcr screen     Status: Abnormal   Collection Time: 04/01/23  3:38 PM   Specimen: Nasal Mucosa; Nasal Swab  Result Value Ref Range Status    MRSA, PCR NEGATIVE NEGATIVE Final   Staphylococcus aureus POSITIVE (A) NEGATIVE Final    Comment: (NOTE) The Xpert SA Assay (FDA approved for NASAL specimens in patients 12 years of age and older), is one component of a comprehensive surveillance program. It is not intended to diagnose infection nor to guide or monitor treatment. Performed at [facility], [address] [place], [place] [address]          Radiology Studies: DG Knee Right Port  Result Date: 04/01/2023 CLINICAL DATA:  Femoral neck fracture EXAM: PORTABLE RIGHT KNEE - 1-2 VIEW COMPARISON:  Right hip x-ray 04/01/2023 FINDINGS: No evidence of fracture, dislocation, or joint effusion. No evidence of arthropathy or other focal bone abnormality. Soft tissues are unremarkable. IMPRESSION: Negative. Electronically Signed   By: Darliss Cheney M.D.   On: 04/01/2023 21:23   DG Hip Unilat With Pelvis 2-3 Views Right  Result Date: 04/01/2023 CLINICAL DATA:  Hip pain.  Fall. EXAM: DG HIP (WITH OR WITHOUT PELVIS) 2-3V RIGHT COMPARISON:  None Available. FINDINGS: There is an acute transcervical fracture of the right femoral neck with associated impaction and superior displacement of the distal segment measuring approximately 2 cm. No additional fracture identified. The left hip joint is anatomically aligned with degenerative changes. IMPRESSION: Acute, impacted, and displaced right femoral neck transcervical fracture. Electronically Signed   By: Hart Robinsons M.D.   On: 04/01/2023 12:25        Scheduled Meds:  amLODipine  10 mg Oral Daily   aspirin EC  81 mg Oral Daily   atorvastatin  80 mg Oral Daily   carvedilol  3.125 mg Oral BID WC   Chlorhexidine Gluconate Cloth  6 each Topical Q0600   dorzolamide-timolol  1 drop Both Eyes BID   mupirocin ointment  1 Application Nasal BID   pantoprazole  40 mg Oral Daily   trimethoprim-polymyxin b  1 drop Left Eye QID   Continuous  Infusions:        Glade Lloyd, MD [facility] 04/02/2023, 10:17 AM

## 2023-04-02 NOTE — Progress Notes (Signed)
    Subjective:  Patient reports pain as mild to moderate.  He reports pain controlled with medication if he doesn't move. Denies N/V/CP/SOB.  Patient reports he tripped and fell on his right side yesterday. Patient lives in independent living community with his wife. He normally is very active. He does not use any assistive devices per baseline. Has CAD post stent. Denies any kidney, lung, T2DM history. Denies any blood clot history. He takes aspirin 81mg  per baseline.   Objective:   VITALS:   Vitals:   04/01/23 1747 04/01/23 2127 04/02/23 0231 04/02/23 0546  BP: 138/72 124/73 124/75 128/73  Pulse: 70 70 66 66  Resp:  18 19 19   Temp:  98.5 F (36.9 C) 97.9 F (36.6 C) 98.1 F (36.7 C)  TempSrc:      SpO2:  93% 91% 90%  Weight:      Height:        Patient sitting up in bed. NAD.  Neurologically intact ABD soft Neurovascular intact Sensation intact distally Intact pulses distally Dorsiflexion/Plantar flexion intact No cellulitis present Compartment soft No skin wounds or lesions.  RLE shortened and externally rotated.  Distal pedal pulses 2+ bilaterally.  Calves soft and non-tender.   Lab Results  Component Value Date   WBC 10.3 04/02/2023   HGB 13.8 04/02/2023   HCT 40.3 04/02/2023   MCV 99.8 04/02/2023   PLT 213 04/02/2023   BMET    Component Value Date/Time   NA 135 04/02/2023 0327   K 3.4 (L) 04/02/2023 0327   CL 98 04/02/2023 0327   CO2 29 04/02/2023 0327   GLUCOSE 109 (H) 04/02/2023 0327   BUN 13 04/02/2023 0327   CREATININE 0.75 04/02/2023 0327   CALCIUM 8.5 (L) 04/02/2023 0327   GFRNONAA >60 04/02/2023 0327     Assessment/Plan:     Principal Problem:   Closed right hip fracture (HCC)  Plan for right THA today. Patient to remian NPO. Continue to hold chemical DVT ppx. Discussed R/B/A with patient. See statement of risk.  The risks, benefits, and alternatives were discussed with the patient. There are risks associated with the surgery  including, but not limited to, problems with anesthesia (death), infection, differences in leg length/angulation/rotation, fracture of bones, loosening or failure of implants, malunion, nonunion, hematoma (blood accumulation) which may require surgical drainage, blood clots, pulmonary embolism, nerve injury (foot drop), and blood vessel injury. The patient understands these risks and elects to proceed.    Clois Dupes, PA-C 04/02/2023, 8:26 AM   [facility]  [place] [address], [address], [place], [place] [address] Phone: [phone] [email or web address] Facebook  [employer]

## 2023-04-02 NOTE — Discharge Instructions (Signed)
? ?Dr. Brian Swinteck ?Joint Replacement Specialist ?[facility] ?[address] ?[place], [place] [address] ?[phone] ? ? ?TOTAL HIP REPLACEMENT POSTOPERATIVE DIRECTIONS ? ? ? ?Hip Rehabilitation, Guidelines Following Surgery  ? ?WEIGHT BEARING ?Weight bearing as tolerated with assist device (walker, cane, etc) as directed, use it as long as suggested by your surgeon or therapist, typically at least 4-6 weeks. ? ?The results of a hip operation are greatly improved after range of motion and muscle strengthening exercises. Follow all safety measures which are given to protect your hip. If any of these exercises cause increased pain or swelling in your joint, decrease the amount until you are comfortable again. Then slowly increase the exercises. Call your caregiver if you have problems or questions.  ? ?HOME CARE INSTRUCTIONS  ?Most of the following instructions are designed to prevent the dislocation of your new hip.  ?Remove items at home which could result in a fall. This includes throw rugs or furniture in walking pathways.  ?Continue medications as instructed at time of discharge. ?You may have some home medications which will be placed on hold until you complete the course of blood thinner medication. ?You may start showering once you are discharged home. Do not remove your dressing. ?Do not put on socks or shoes without following the instructions of your caregivers.   ?Sit on chairs with arms. Use the chair arms to help push yourself up when arising.  ?Arrange for the use of a toilet seat elevator so you are not sitting low.  ?Walk with walker as instructed.  ?You may resume a sexual relationship in one month or when given the OK by your caregiver.  ?Use walker as long as suggested by your caregivers.  ?You may put full weight on your legs and walk as much as is comfortable. ?Avoid periods of inactivity such as sitting longer than an hour when not asleep. This helps prevent blood  clots.  ?You may return to work once you are cleared by your surgeon.  ?Do not drive a car for 6 weeks or until released by your surgeon.  ?Do not drive while taking narcotics.  ?Wear elastic stockings for two weeks following surgery during the day but you may remove then at night.  ?Make sure you keep all of your appointments after your operation with all of your doctors and caregivers. You should call the office at the above phone number and make an appointment for approximately two weeks after the date of your surgery. ?Please pick up a stool softener and laxative for home use as long as you are requiring pain medications. ?ICE to the affected hip every three hours for 30 minutes at a time and then as needed for pain and swelling. Continue to use ice on the hip for pain and swelling from surgery. You may notice swelling that will progress down to the foot and ankle.  This is normal after surgery.  Elevate the leg when you are not up walking on it.   ?It is important for you to complete the blood thinner medication as prescribed by your doctor. ?Continue to use the breathing machine which will help keep your temperature down.  It is common for your temperature to cycle up and down following surgery, especially at night when you are not up moving around and exerting yourself.  The breathing machine keeps your lungs expanded and your temperature down. ? ?RANGE OF MOTION AND STRENGTHENING EXERCISES  ?These exercises are designed to help you   keep full movement of your hip joint. Follow your caregiver's or physical therapist's instructions. Perform all exercises about fifteen times, three times per day or as directed. Exercise both hips, even if you have had only one joint replacement. These exercises can be done on a training (exercise) mat, on the floor, on a table or on a bed. Use whatever works the best and is most comfortable for you. Use music or television while you are exercising so that the exercises are a  pleasant break in your day. This will make your life better with the exercises acting as a break in routine you can look forward to.  ?Lying on your back, slowly slide your foot toward your buttocks, raising your knee up off the floor. Then slowly slide your foot back down until your leg is straight again.  ?Lying on your back spread your legs as far apart as you can without causing discomfort.  ?Lying on your side, raise your upper leg and foot straight up from the floor as far as is comfortable. Slowly lower the leg and repeat.  ?Lying on your back, tighten up the muscle in the front of your thigh (quadriceps muscles). You can do this by keeping your leg straight and trying to raise your heel off the floor. This helps strengthen the largest muscle supporting your knee.  ?Lying on your back, tighten up the muscles of your buttocks both with the legs straight and with the knee bent at a comfortable angle while keeping your heel on the floor.  ? ?SKILLED REHAB INSTRUCTIONS: ?If the patient is transferred to a skilled rehab facility following release from the hospital, a list of the current medications will be sent to the facility for the patient to continue.  When discharged from the skilled rehab facility, please have the facility set up the patient's Home Health Physical Therapy prior to being released. Also, the skilled facility will be responsible for providing the patient with their medications at time of release from the facility to include their pain medication and their blood thinner medication. If the patient is still at the rehab facility at time of the two week follow up appointment, the skilled rehab facility will also need to assist the patient in arranging follow up appointment in our office and any transportation needs. ? ?POST-OPERATIVE OPIOID TAPER INSTRUCTIONS: ?It is important to wean off of your opioid medication as soon as possible. If you do not need pain medication after your surgery it is ok  to stop day one. ?Opioids include: ?Codeine, Hydrocodone(Norco, Vicodin), Oxycodone(Percocet, oxycontin) and hydromorphone amongst others.  ?Long term and even short term use of opiods can cause: ?Increased pain response ?Dependence ?Constipation ?Depression ?Respiratory depression ?And more.  ?Withdrawal symptoms can include ?Flu like symptoms ?Nausea, vomiting ?And more ?Techniques to manage these symptoms ?Hydrate well ?Eat regular healthy meals ?Stay active ?Use relaxation techniques(deep breathing, meditating, yoga) ?Do Not substitute Alcohol to help with tapering ?If you have been on opioids for less than two weeks and do not have pain than it is ok to stop all together.  ?Plan to wean off of opioids ?This plan should start within one week post op of your joint replacement. ?Maintain the same interval or time between taking each dose and first decrease the dose.  ?Cut the total daily intake of opioids by one tablet each day ?Next start to increase the time between doses. ?The last dose that should be eliminated is the evening dose.  ? ? ?MAKE   SURE YOU:  ?Understand these instructions.  ?Will watch your condition.  ?Will get help right away if you are not doing well or get worse. ? ?Pick up stool softner and laxative for home use following surgery while on pain medications. ?Do not remove your dressing. ?The dressing is waterproof--it is OK to take showers. ?Continue to use ice for pain and swelling after surgery. ?Do not use any lotions or creams on the incision until instructed by your surgeon. ?Total Hip Protocol. ? ?

## 2023-04-02 NOTE — Op Note (Signed)
OPERATIVE REPORT  SURGEON: Samson Frederic, MD   ASSISTANT: Clint Bolder, PA-C  PREOPERATIVE DIAGNOSIS: Displaced Right femoral neck fracture.   POSTOPERATIVE DIAGNOSIS: Displaced Right femoral neck fracture.   PROCEDURE: Right total hip arthroplasty, anterior approach.   IMPLANTS: Biomet Taperloc Reduced Distal stem, size 18 x 156 mm. high offset. Biomet G7 OsseoTi Cup, size 56 mm. Biomet Vivacit-E liner, size 36 mm, F, neutral. Biomet metal head ball, size 36 - 6 mm.  ANESTHESIA:  MAC and Spinal  ANTIBIOTICS: 2g ancef.  ESTIMATED BLOOD LOSS:-250 mL    DRAINS: None.  COMPLICATIONS: None   CONDITION: PACU - hemodynamically stable.   BRIEF CLINICAL NOTE: Kenneth Pope is a 89 y.o. male with a displaced Right femoral neck fracture. The patient was admitted to the hospitalist service and underwent perioperative risk stratification and medical optimization. The risks, benefits, and alternatives to total hip arthroplasty were explained, and the patient elected to proceed.  PROCEDURE IN DETAIL: The patient was taken to the operating room and general anesthesia was induced on the hospital bed.  The patient was then positioned on the Hana table.  All bony prominences were well padded.  The hip was prepped and draped in the normal sterile surgical fashion.  A time-out was called verifying side and site of surgery. Antibiotics were given within 60 minutes of beginning the procedure.   Bikini incision was made, and the direct anterior approach to the hip was performed through the Hueter interval.  Lateral femoral circumflex vessels were treated with the Auqumantys. The anterior capsule was exposed and an inverted T capsulotomy was made.  Fracture hematoma was encountered and evacuated. The patient was found to have a comminuted Right subcapital femoral neck fracture.  I freshened the femoral neck cut with a saw.  I removed the femoral neck fragment.  A corkscrew was placed into the head and  the head was removed.  This was passed to the back table and was measured. The pubofemoral ligament was released subperiosteally to the lesser trochanter.  Acetabular exposure was achieved, and the pulvinar and labrum were excised. Sequential reaming of the acetabulum was then performed up to a size 55 mm reamer under direct visulization. A 56 mm cup was then opened and impacted into place at approximately 40 degrees of abduction and 20 degrees of anteversion. The final polyethylene liner was impacted into place and acetabular osteophytes were removed.    I then gained femoral exposure taking care to protect the abductors and greater trochanter.  This was performed using standard external rotation, extension, and adduction.  A cookie cutter was used to enter the femoral canal, and then the femoral canal finder was placed.  Sequential broaching was performed up to a size 18.  Calcar planer was used on the femoral neck remnant.  I placed a high offset neck and a trial head ball.  The hip was reduced.  Leg lengths and offset were checked fluoroscopically.  The hip was dislocated and trial components were removed.  The final implants were placed, and the hip was reduced.  Fluoroscopy was used to confirm component position and leg lengths.  At 90 degrees of external rotation and full extension, the hip was stable to an anterior directed force.   The wound was copiously irrigated with Irrisept solution and normal saline using pulse lavage.  Marcaine solution was injected into the periarticular soft tissue.  The wound was closed in layers using #1 Stratafix for the fascia, 2-0 Vicryl for the subcutaneous fat,  2-0 Monocryl for the deep dermal layer, 3-0 running Monocryl subcuticular stitch, and Dermabond for the skin.  Once the glue was fully dried, an Aquacell Ag dressing was applied.  The patient was transported to the recovery room in stable condition.  Sponge, needle, and instrument counts were correct at the end  of the case x2.  The patient tolerated the procedure well and there were no known complications.  Please note that a surgical assistant was a medical necessity for this procedure to perform it in a safe and expeditious manner. Assistant was necessary to provide appropriate retraction of vital neurovascular structures, to prevent femoral fracture, and to allow for anatomic placement of the prosthesis.

## 2023-04-02 NOTE — Plan of Care (Signed)

## 2023-04-02 NOTE — Interval H&P Note (Signed)
History and Physical Interval Note:  04/02/2023 3:28 PM  Kenneth Pope  has presented today for surgery, with the diagnosis of right hip fracture.  The various methods of treatment have been discussed with the patient and family. After consideration of risks, benefits and other options for treatment, the patient has consented to  Procedure(s): TOTAL HIP ARTHROPLASTY ANTERIOR APPROACH VS HEMI (Right) as a surgical intervention.  The patient's history has been reviewed, patient examined, no change in status, stable for surgery.  I have reviewed the patient's chart and labs.  Questions were answered to the patient's satisfaction.     Iline Oven Arihant Pennings

## 2023-04-03 ENCOUNTER — Encounter (HOSPITAL_COMMUNITY): Payer: Self-pay | Admitting: Orthopedic Surgery

## 2023-04-03 DIAGNOSIS — S72001A Fracture of unspecified part of neck of right femur, initial encounter for closed fracture: Secondary | ICD-10-CM | POA: Diagnosis not present

## 2023-04-03 LAB — BASIC METABOLIC PANEL
Anion gap: 10 (ref 5–15)
BUN: 16 mg/dL (ref 8–23)
CO2: 24 mmol/L (ref 22–32)
Calcium: 8.4 mg/dL — ABNORMAL LOW (ref 8.9–10.3)
Chloride: 97 mmol/L — ABNORMAL LOW (ref 98–111)
Creatinine, Ser: 0.79 mg/dL (ref 0.61–1.24)
GFR, Estimated: >60 mL/min (ref >60)
Glucose, Bld: 156 mg/dL — ABNORMAL HIGH (ref 70–99)
Potassium: 3.5 mmol/L (ref 3.5–5.1)
Sodium: 131 mmol/L — ABNORMAL LOW (ref 135–145)

## 2023-04-03 LAB — CBC
HCT: 40.6 % (ref 39.0–52.0)
Hemoglobin: 13.9 g/dL (ref 13.0–17.0)
MCH: 33.8 pg (ref 26.0–34.0)
MCHC: 34.2 g/dL (ref 30.0–36.0)
MCV: 98.8 fL (ref 80.0–100.0)
Platelets: 208 10*3/uL (ref 150–400)
RBC: 4.11 MIL/uL — ABNORMAL LOW (ref 4.22–5.81)
RDW: 12.4 % (ref 11.5–15.5)
WBC: 12.3 10*3/uL — ABNORMAL HIGH (ref 4.0–10.5)
nRBC: 0 % (ref 0.0–0.2)

## 2023-04-03 MED ORDER — POLYETHYLENE GLYCOL 3350 17 G PO PACK: 17.0000 g | PACK | Freq: Every day | ORAL | 0 refills | Status: DC | PRN

## 2023-04-03 MED ORDER — ASPIRIN 81 MG PO CHEW: 81.0000 mg | CHEWABLE_TABLET | Freq: Two times a day (BID) | ORAL | 0 refills | Status: AC

## 2023-04-03 MED ORDER — HYDROCODONE-ACETAMINOPHEN 5-325 MG PO TABS
1.0000 | ORAL_TABLET | ORAL | 0 refills | Status: AC | PRN
Start: 2023-04-03 — End: 2023-04-10

## 2023-04-03 MED ORDER — DOCUSATE SODIUM 100 MG PO CAPS: 100.0000 mg | ORAL_CAPSULE | Freq: Two times a day (BID) | ORAL | 0 refills | Status: DC

## 2023-04-03 NOTE — Progress Notes (Signed)
Discharge package printed and instructions given to pt and wife. Verbalize understanding.

## 2023-04-03 NOTE — Plan of Care (Signed)
Problem: Activity: Goal: Risk for activity intolerance will decrease Outcome: Progressing   Problem: Pain Managment: Goal: General experience of comfort will improve Outcome: Progressing

## 2023-04-03 NOTE — Progress Notes (Signed)
PROGRESS NOTE    Kenneth Pope  ZOX:[id number] DOB: 07-24-1933 DOA: 04/01/2023 PCP: Adrian Prince, MD   Brief Narrative:  89 y.o. male with medical history significant for hypertension, hyperlipidemia, CAD with stent to RCA in 2019 on daily aspirin presented with a mechanical fall and was found to have right hip fracture.  He underwent surgical intervention by orthopedics on 04/02/2023.  Assessment & Plan:   Closed right hip fracture after a mechanical fall -Continue pain management.  Fall precautions.  -underwent surgical intervention by orthopedics on 04/02/2023.  Wound care/pain management/activity/DVT prophylaxis as per orthopedics recommendations.  PT eval.  History of CAD Hyperlipidemia Hypertension -Stable.  Continue aspirin, statin, amlodipine and Coreg.  Hypokalemia -Improved  Hyponatremia -Mild.  Monitor  Leukocytosis -Mild.  Possibly reactive.  DVT prophylaxis: Lovenox Code Status: Full Family Communication: Wife at bedside Disposition Plan: Status is: Inpatient Remains inpatient appropriate because: Of severity of illness.  Need for PT eval Consultants: Orthopedics  Procedures: None  Antimicrobials: None   Subjective: Patient seen and examined at bedside.  Denies worsening shortness of breath, fever, vomiting.  Having intermittent right hip pain. Objective: Vitals:   04/02/23 2001 04/02/23 2206 04/03/23 0254 04/03/23 0639  BP: 114/72 122/76 112/72 135/76  Pulse: 65 76 76 77  Resp: 18 18 18 18   Temp: 98.1 F (36.7 C) 97.8 F (36.6 C) 98 F (36.7 C) 98.6 F (37 C)  TempSrc:  Oral Oral Oral  SpO2: 94% 94% 98% 99%  Weight:      Height:        Intake/Output Summary (Last 24 hours) at 04/03/2023 0748 Last data filed at 04/03/2023 0600 Gross per 24 hour  Intake 1800 ml  Output 1150 ml  Net 650 ml   Filed Weights   04/01/23 0943  Weight: 73 kg    Examination:  General exam: No acute distress.  On room air.   Respiratory system:  Decreased breath sounds at bases bilaterally with some crackles  cardiovascular system: Rate controlled currently; S1 and S2 are heard Gastrointestinal system: Abdomen is distended slightly, soft and nontender.  Bowel sounds normally heard  extremities: No edema or clubbing  Data Reviewed: I have personally reviewed following labs and imaging studies  CBC: Recent Labs  Lab 04/01/23 0949 04/02/23 0327 04/03/23 0336  WBC 8.7 10.3 12.3*  NEUTROABS 5.1  --   --   HGB 14.4 13.8 13.9  HCT 41.7 40.3 40.6  MCV 99.5 99.8 98.8  PLT 210 213 208   Basic Metabolic Panel: Recent Labs  Lab 04/01/23 0949 04/02/23 0327 04/03/23 0336  NA 135 135 131*  K 3.8 3.4* 3.5  CL 103 98 97*  CO2 24 29 24   GLUCOSE 134* 109* 156*  BUN 13 13 16   CREATININE 0.65 0.75 0.79  CALCIUM 8.7* 8.5* 8.4*   GFR: Estimated Creatinine Clearance: 62.9 mL/min (by C-G formula based on SCr of 0.79 mg/dL). Liver Function Tests: No results for input(s): "AST", "ALT", "ALKPHOS", "BILITOT", "PROT", "ALBUMIN" in the last 168 hours. No results for input(s): "LIPASE", "AMYLASE" in the last 168 hours. No results for input(s): "AMMONIA" in the last 168 hours. Coagulation Profile: No results for input(s): "INR", "PROTIME" in the last 168 hours. Cardiac Enzymes: No results for input(s): "CKTOTAL", "CKMB", "CKMBINDEX", "TROPONINI" in the last 168 hours. BNP (last 3 results) No results for input(s): "PROBNP" in the last 8760 hours. HbA1C: No results for input(s): "HGBA1C" in the last 72 hours. CBG: No results for input(s): "GLUCAP" in the  last 168 hours. Lipid Profile: No results for input(s): "CHOL", "HDL", "LDLCALC", "TRIG", "CHOLHDL", "LDLDIRECT" in the last 72 hours. Thyroid Function Tests: No results for input(s): "TSH", "T4TOTAL", "FREET4", "T3FREE", "THYROIDAB" in the last 72 hours. Anemia Panel: No results for input(s): "VITAMINB12", "FOLATE", "FERRITIN", "TIBC", "IRON", "RETICCTPCT" in the last 72 hours. Sepsis  Labs: No results for input(s): "PROCALCITON", "LATICACIDVEN" in the last 168 hours.  Recent Results (from the past 240 hour(s))  Surgical pcr screen     Status: Abnormal   Collection Time: 04/01/23  3:38 PM   Specimen: Nasal Mucosa; Nasal Swab  Result Value Ref Range Status   MRSA, PCR NEGATIVE NEGATIVE Final   Staphylococcus aureus POSITIVE (A) NEGATIVE Final    Comment: (NOTE) The Xpert SA Assay (FDA approved for NASAL specimens in patients 39 years of age and older), is one component of a comprehensive surveillance program. It is not intended to diagnose infection nor to guide or monitor treatment. Performed at [facility], [address] [place], [place] [address]          Radiology Studies: DG Pelvis Portable  Result Date: 04/02/2023 CLINICAL DATA:  Postop in PACU. EXAM: PORTABLE PELVIS 1-2 VIEWS COMPARISON:  None Available. FINDINGS: Right hip arthroplasty in expected alignment. No periprosthetic lucency or fracture. Recent postsurgical change includes air and edema in the soft tissues. Lateral skin staples in place. IMPRESSION: Right hip arthroplasty without immediate postoperative complication. Electronically Signed   By: Narda Rutherford M.D.   On: 04/02/2023 19:26   DG HIP UNILAT WITH PELVIS 1V RIGHT  Result Date: 04/02/2023 CLINICAL DATA:  Elective surgery. EXAM: DG HIP (WITH OR WITHOUT PELVIS) 1V RIGHT COMPARISON:  None Available. FINDINGS: Ten fluoroscopic spot views of the pelvis and right hip obtained in the operating room. Sequential images during hip arthroplasty. Fluoroscopy time 14 seconds. Dose 2.0293 mGy. IMPRESSION: Intraoperative fluoroscopy for right hip arthroplasty. Electronically Signed   By: Narda Rutherford M.D.   On: 04/02/2023 19:26   DG C-Arm 1-60 Min-No Report  Result Date: 04/02/2023 Fluoroscopy was utilized by the requesting physician.  No radiographic interpretation.   DG C-Arm 1-60 Min-No Report  Result Date:  04/02/2023 Fluoroscopy was utilized by the requesting physician.  No radiographic interpretation.   DG Knee Right Port  Result Date: 04/01/2023 CLINICAL DATA:  Femoral neck fracture EXAM: PORTABLE RIGHT KNEE - 1-2 VIEW COMPARISON:  Right hip x-ray 04/01/2023 FINDINGS: No evidence of fracture, dislocation, or joint effusion. No evidence of arthropathy or other focal bone abnormality. Soft tissues are unremarkable. IMPRESSION: Negative. Electronically Signed   By: Darliss Cheney M.D.   On: 04/01/2023 21:23   DG Hip Unilat With Pelvis 2-3 Views Right  Result Date: 04/01/2023 CLINICAL DATA:  Hip pain.  Fall. EXAM: DG HIP (WITH OR WITHOUT PELVIS) 2-3V RIGHT COMPARISON:  None Available. FINDINGS: There is an acute transcervical fracture of the right femoral neck with associated impaction and superior displacement of the distal segment measuring approximately 2 cm. No additional fracture identified. The left hip joint is anatomically aligned with degenerative changes. IMPRESSION: Acute, impacted, and displaced right femoral neck transcervical fracture. Electronically Signed   By: Hart Robinsons M.D.   On: 04/01/2023 12:25        Scheduled Meds:  amLODipine  10 mg Oral Daily   aspirin EC  325 mg Oral Daily   atorvastatin  80 mg Oral Daily   carvedilol  3.125 mg Oral BID WC   Chlorhexidine Gluconate Cloth  6  each Topical Q0600   docusate sodium  100 mg Oral BID   dorzolamide-timolol  1 drop Both Eyes BID   mupirocin ointment  1 Application Nasal BID   pantoprazole  40 mg Oral Daily   senna  1 tablet Oral BID   trimethoprim-polymyxin b  1 drop Left Eye QID   Continuous Infusions:  lactated ringers 50 mL/hr at 04/02/23 2109   methocarbamol (ROBAXIN) IV            Glade Lloyd, MD [facility] 04/03/2023, 7:48 AM

## 2023-04-03 NOTE — Progress Notes (Signed)
    Subjective:  Patient reports pain as mild.  Denies N/V/CP/SOB/Abd pain. He denies any tingling or numbness in LE bilaterally. He reports mild soreness this morning controlled with tylenol. Patient hopeful for d/c home with wife.   Objective:   VITALS:   Vitals:   04/02/23 2001 04/02/23 2206 04/03/23 0254 04/03/23 0639  BP: 114/72 122/76 112/72 135/76  Pulse: 65 76 76 77  Resp: 18 18 18 18   Temp: 98.1 F (36.7 C) 97.8 F (36.6 C) 98 F (36.7 C) 98.6 F (37 C)  TempSrc:  Oral Oral Oral  SpO2: 94% 94% 98% 99%  Weight:      Height:        Patient sitting up in bed. NAD.  Neurologically intact ABD soft Neurovascular intact Sensation intact distally Intact pulses distally Dorsiflexion/Plantar flexion intact Incision: dressing C/D/I No cellulitis present Compartment soft Painless log rolling of the hip.   Lab Results  Component Value Date   WBC 12.3 (H) 04/03/2023   HGB 13.9 04/03/2023   HCT 40.6 04/03/2023   MCV 98.8 04/03/2023   PLT 208 04/03/2023   BMET    Component Value Date/Time   NA 131 (L) 04/03/2023 0336   K 3.5 04/03/2023 0336   CL 97 (L) 04/03/2023 0336   CO2 24 04/03/2023 0336   GLUCOSE 156 (H) 04/03/2023 0336   BUN 16 04/03/2023 0336   CREATININE 0.79 04/03/2023 0336   CALCIUM 8.4 (L) 04/03/2023 0336   GFRNONAA >60 04/03/2023 0336     Assessment/Plan: 1 Day Post-Op   Principal Problem:   Closed right hip fracture (HCC)   WBAT with walker DVT ppx: Aspirin, SCDs, TEDS PO pain control PT/OT: PT has not seen yet. PT to come by today.  Dispo: Patient is under care of the medical team, disposition per their recommendation. Patient hopeful for d/c home. Plan for aspirin 81mg  BID for 6 weeks for DVT ppx.    Clois Dupes, PA-C 04/03/2023, 7:35 AM   [facility]  [place] [address], [place], [place] [address] Phone: [phone] [email or web address] Facebook  [employer]

## 2023-04-03 NOTE — Evaluation (Signed)
Physical Therapy Evaluation Patient Details Name: Kenneth Pope MRN: [id number] DOB: July 30, 1933 Today's Date: 04/03/2023  History of Present Illness  89 y.o. male admitted 04/01/23 with R hip fx, s/p AA-THA 04/02/23. PMH: HTN, CAD, skin cancer, OA.  Clinical Impression  Pt admitted with above diagnosis. Pt ambulated 140' with RW, no loss of balance. Pt demonstrates good understanding of HEP.  Pt currently with functional limitations due to the deficits listed below (see PT Problem List). Pt will benefit from acute skilled PT to increase their independence and safety with mobility to allow discharge.           If plan is discharge home, recommend the following: A little help with bathing/dressing/bathroom;Assist for transportation;Help with stairs or ramp for entrance   Can travel by private vehicle        Equipment Recommendations None recommended by PT  Recommendations for Other Services       Functional Status Assessment Patient has had a recent decline in their functional status and demonstrates the ability to make significant improvements in function in a reasonable and predictable amount of time.     Precautions / Restrictions Precautions Precautions: Fall Precaution Comments: fell just prior to admission; denies other falls in past 6 months Restrictions Weight Bearing Restrictions: No Other Position/Activity Restrictions: WBAT RLE      Mobility  Bed Mobility Overal bed mobility: Modified Independent             General bed mobility comments: used gait belt as a leg lifter    Transfers Overall transfer level: Needs assistance Equipment used: Rolling walker (2 wheels) Transfers: Sit to/from Stand Sit to Stand: Supervision, From elevated surface           General transfer comment: VCs hand placement    Ambulation/Gait Ambulation/Gait assistance: Supervision Gait Distance (Feet): 140 Feet Assistive device: Rolling walker (2 wheels) Gait  Pattern/deviations: Step-to pattern, Step-through pattern, Decreased stride length Gait velocity: decr     General Gait Details: step to progressed to step through, no loss of balance  Stairs            Wheelchair Mobility     Tilt Bed    Modified Rankin (Stroke Patients Only)       Balance Overall balance assessment: Modified Independent                                           Pertinent Vitals/Pain Pain Assessment Pain Assessment: 0-10 Pain Score: 4  Pain Location: R hip with walking Pain Descriptors / Indicators: Sore Pain Intervention(s): Limited activity within patient's tolerance, Monitored during session, Premedicated before session, Ice applied    Home Living Family/patient expects to be discharged to:: Private residence Living Arrangements: Spouse/significant other Available Help at Discharge: Family;Available 24 hours/day Type of Home: Independent living facility Home Access: Level entry       Home Layout: One level Home Equipment: Agricultural consultant (2 wheels);Cane - single point;Grab bars - toilet;Grab bars - tub/shower Additional Comments: ILF at [facility]    Prior Function Prior Level of Function : Independent/Modified Independent             Mobility Comments: walks without AD, no falls in past 6 months ADLs Comments: independent     Extremity/Trunk Assessment   Upper Extremity Assessment Upper Extremity Assessment: Overall WFL for tasks assessed    Lower Extremity Assessment Lower  Extremity Assessment: RLE deficits/detail RLE Deficits / Details: hip -3/5 grossly RLE Sensation: WNL RLE Coordination: WNL    Cervical / Trunk Assessment Cervical / Trunk Assessment: Normal  Communication   Communication Communication: No apparent difficulties  Cognition Arousal: Alert Behavior During Therapy: WFL for tasks assessed/performed Overall Cognitive Status: Within Functional Limits for tasks assessed                                           General Comments      Exercises Total Joint Exercises Ankle Circles/Pumps: AROM, Both, 10 reps, Supine Quad Sets: AROM, Both, 5 reps, Supine Short Arc Quad: AROM, Right, 5 reps, Supine Heel Slides: AAROM, Right, Supine, 5 reps Hip ABduction/ADduction: AAROM, Right, 5 reps, Supine Long Arc Quad: AROM, Right, 5 reps, Seated   Assessment/Plan    PT Assessment Patient needs continued PT services  PT Problem List Decreased activity tolerance;Decreased mobility       PT Treatment Interventions DME instruction;Gait training;Therapeutic activities;Therapeutic exercise    PT Goals (Current goals can be found in the Care Plan section)  Acute Rehab PT Goals Patient Stated Goal: return to independence PT Goal Formulation: With patient/family Time For Goal Achievement: 04/10/23 Potential to Achieve Goals: Good    Frequency Min 6X/week     Co-evaluation               AM-PAC PT "6 Clicks" Mobility  Outcome Measure Help needed turning from your back to your side while in a flat bed without using bedrails?: None Help needed moving from lying on your back to sitting on the side of a flat bed without using bedrails?: A Little Help needed moving to and from a bed to a chair (including a wheelchair)?: A Little Help needed standing up from a chair using your arms (e.g., wheelchair or bedside chair)?: A Little Help needed to walk in hospital room?: A Little Help needed climbing 3-5 steps with a railing? : A Little 6 Click Score: 19    End of Session Equipment Utilized During Treatment: Gait belt Activity Tolerance: Patient tolerated treatment well Patient left: in chair;with call bell/phone within reach;with family/visitor present Nurse Communication: Mobility status PT Visit Diagnosis: Difficulty in walking, not elsewhere classified (R26.2)    Time: 5621-3086 PT Time Calculation (min) (ACUTE ONLY): 38 min   Charges:   PT  Evaluation $PT Eval Moderate Complexity: 1 Mod PT Treatments $Gait Training: 8-22 mins $Therapeutic Exercise: 8-22 mins PT General Charges $$ ACUTE PT VISIT: 1 Visit        Tamala Ser PT 04/03/2023  Acute Rehabilitation Services  Office [phone]

## 2023-04-03 NOTE — Discharge Summary (Signed)
Physician Discharge Summary  Antinio Sanderfer UJW:[id number] DOB: 11-20-1933 DOA: 04/01/2023  PCP: Adrian Prince, MD  Admit date: 04/01/2023 Discharge date: 04/03/2023  Admitted From: Home Disposition: Home/Independent living facility  Recommendations for Outpatient Follow-up:  Follow up with PCP in 1 week with repeat CBC/BMP Outpatient follow up with Orthopedics. Wound care/activity/DVT prophylaxis/pain management as per orthopedics recommendations Follow up in ED if symptoms worsen or new appear   Home Health: No Equipment/Devices: None  Discharge Condition: Stable CODE STATUS: Full Diet recommendation: Heart healthy  Brief/Interim Summary: 89 y.o. male with medical history significant for hypertension, hyperlipidemia, CAD with stent to RCA in 2019 on daily aspirin presented with a mechanical fall and was found to have right hip fracture.  He underwent surgical intervention by orthopedics on 04/02/2023. PT recommended HHPT. He will be discharged home today with outpatient follow up with Orthopedics.  Discharge Diagnoses:   Closed right hip fracture after a mechanical fall -Continue pain management.  Fall precautions.  -underwent surgical intervention by orthopedics on 04/02/2023.  Wound care/pain management/activity/DVT prophylaxis as per orthopedics recommendations.   -PT recommended HHPT. He will be discharged home today with outpatient follow up with Orthopedics.   History of CAD Hyperlipidemia Hypertension -Stable.  Continue aspirin, statin, amlodipine and Coreg.   Hypokalemia -Improved   Hyponatremia -Mild.  Outpatient follow up    Leukocytosis -Mild.  Possibly reactive.  Discharge Instructions  Discharge Instructions     Diet - low sodium heart healthy   Complete by: As directed    Increase activity slowly   Complete by: As directed       Allergies as of 04/03/2023   No Known Allergies      Medication List     STOP taking these medications     aspirin EC 81 MG tablet Replaced by: aspirin 81 MG chewable tablet       TAKE these medications    acetaminophen 500 MG tablet Commonly known as: TYLENOL Take 500 mg by mouth as needed for headache.   amLODipine 10 MG tablet Commonly known as: NORVASC TAKE 1 TABLET BY MOUTH EVERY DAY   aspirin 81 MG chewable tablet Commonly known as: Aspirin Childrens Chew 1 tablet (81 mg total) by mouth 2 (two) times daily with a meal. Replaces: aspirin EC 81 MG tablet   atorvastatin 80 MG tablet Commonly known as: LIPITOR TAKE 1 TABLET(80 MG) BY MOUTH DAILY AT 6 PM What changed: See the new instructions.   CALCIUM 600+D3 PO Take 1 tablet by mouth daily after breakfast.   carvedilol 3.125 MG tablet Commonly known as: COREG Take 1 tablet (3.125 mg total) by mouth 2 (two) times daily with a meal.   docusate sodium 100 MG capsule Commonly known as: COLACE Take 1 capsule (100 mg total) by mouth 2 (two) times daily.   dorzolamide-timolol 2-0.5 % ophthalmic solution Commonly known as: COSOPT Place 1 drop into both eyes See admin instructions. Instill 1 drop into each eye at 10 AM and 5 PM   HYDROcodone-acetaminophen 5-325 MG tablet Commonly known as: NORCO/VICODIN Take 1 tablet by mouth every 4 (four) hours as needed for up to 7 days for moderate pain (pain score 4-6) or severe pain (pain score 7-10).   multivitamin with minerals Tabs tablet Take 1 tablet by mouth daily with breakfast.   nitroGLYCERIN 0.4 MG SL tablet Commonly known as: NITROSTAT Place 1 tablet (0.4 mg total) under the tongue every 5 (five) minutes as needed for chest pain.   pantoprazole  40 MG tablet Commonly known as: PROTONIX Take 40 mg by mouth daily.   polyethylene glycol 17 g packet Commonly known as: MIRALAX / GLYCOLAX Take 17 g by mouth daily as needed for mild constipation.   PreserVision AREDS 2 Caps Take 1 capsule by mouth 2 (two) times daily.   trimethoprim-polymyxin b ophthalmic  solution Commonly known as: POLYTRIM Place 1 drop into the left eye See admin instructions. Instill 1 drop into the left eye four times a day for 2 days after injection        Follow-up Information     Clois Dupes, PA-C. Schedule an appointment as soon as possible for a visit in 2 week(s).   Specialty: Orthopedic Surgery Why: For suture removal, For wound re-check Contact information: [address] [place] [address] [phone]         Adrian Prince, MD. Schedule an appointment as soon as possible for a visit in 1 week(s).   Specialty: Endocrinology Contact information: [address] [place] [address] [phone]                No Known Allergies  Consultations: Orthopedics    Procedures/Studies: DG Pelvis Portable  Result Date: 04/02/2023 CLINICAL DATA:  Postop in PACU. EXAM: PORTABLE PELVIS 1-2 VIEWS COMPARISON:  None Available. FINDINGS: Right hip arthroplasty in expected alignment. No periprosthetic lucency or fracture. Recent postsurgical change includes air and edema in the soft tissues. Lateral skin staples in place. IMPRESSION: Right hip arthroplasty without immediate postoperative complication. Electronically Signed   By: Narda Rutherford M.D.   On: 04/02/2023 19:26   DG HIP UNILAT WITH PELVIS 1V RIGHT  Result Date: 04/02/2023 CLINICAL DATA:  Elective surgery. EXAM: DG HIP (WITH OR WITHOUT PELVIS) 1V RIGHT COMPARISON:  None Available. FINDINGS: Ten fluoroscopic spot views of the pelvis and right hip obtained in the operating room. Sequential images during hip arthroplasty. Fluoroscopy time 14 seconds. Dose 2.0293 mGy. IMPRESSION: Intraoperative fluoroscopy for right hip arthroplasty. Electronically Signed   By: Narda Rutherford M.D.   On: 04/02/2023 19:26   DG C-Arm 1-60 Min-No Report  Result Date: 04/02/2023 Fluoroscopy was utilized by the requesting physician.  No radiographic interpretation.   DG C-Arm 1-60 Min-No  Report  Result Date: 04/02/2023 Fluoroscopy was utilized by the requesting physician.  No radiographic interpretation.   DG Knee Right Port  Result Date: 04/01/2023 CLINICAL DATA:  Femoral neck fracture EXAM: PORTABLE RIGHT KNEE - 1-2 VIEW COMPARISON:  Right hip x-ray 04/01/2023 FINDINGS: No evidence of fracture, dislocation, or joint effusion. No evidence of arthropathy or other focal bone abnormality. Soft tissues are unremarkable. IMPRESSION: Negative. Electronically Signed   By: Darliss Cheney M.D.   On: 04/01/2023 21:23   DG Hip Unilat With Pelvis 2-3 Views Right  Result Date: 04/01/2023 CLINICAL DATA:  Hip pain.  Fall. EXAM: DG HIP (WITH OR WITHOUT PELVIS) 2-3V RIGHT COMPARISON:  None Available. FINDINGS: There is an acute transcervical fracture of the right femoral neck with associated impaction and superior displacement of the distal segment measuring approximately 2 cm. No additional fracture identified. The left hip joint is anatomically aligned with degenerative changes. IMPRESSION: Acute, impacted, and displaced right femoral neck transcervical fracture. Electronically Signed   By: Hart Robinsons M.D.   On: 04/01/2023 12:25      Subjective: Patient seen and examined at bedside.  Denies worsening shortness of breath, fever, vomiting.  Having intermittent right hip pain.   Discharge Exam: Vitals:   04/03/23  1011 04/03/23 1235  BP: 110/70 97/68  Pulse: 67 66  Resp: 18 18  Temp: 97.6 F (36.4 C) 97.6 F (36.4 C)  SpO2: 93% 97%    General exam: No acute distress.  On room air.   Respiratory system: Decreased breath sounds at bases bilaterally with some crackles  cardiovascular system: Rate controlled currently; S1 and S2 are heard Gastrointestinal system: Abdomen is distended slightly, soft and nontender.  Bowel sounds normally heard  extremities: No edema or clubbing    The results of significant diagnostics from this hospitalization (including imaging, microbiology,  ancillary and laboratory) are listed below for reference.     Microbiology: Recent Results (from the past 240 hour(s))  Surgical pcr screen     Status: Abnormal   Collection Time: 04/01/23  3:38 PM   Specimen: Nasal Mucosa; Nasal Swab  Result Value Ref Range Status   MRSA, PCR NEGATIVE NEGATIVE Final   Staphylococcus aureus POSITIVE (A) NEGATIVE Final    Comment: (NOTE) The Xpert SA Assay (FDA approved for NASAL specimens in patients 2 years of age and older), is one component of a comprehensive surveillance program. It is not intended to diagnose infection nor to guide or monitor treatment. Performed at [facility], [address] [place], [place] [address]      Labs: BNP (last 3 results) No results for input(s): "BNP" in the last 8760 hours. Basic Metabolic Panel: Recent Labs  Lab 04/01/23 0949 04/02/23 0327 04/03/23 0336  NA 135 135 131*  K 3.8 3.4* 3.5  CL 103 98 97*  CO2 24 29 24   GLUCOSE 134* 109* 156*  BUN 13 13 16   CREATININE 0.65 0.75 0.79  CALCIUM 8.7* 8.5* 8.4*   Liver Function Tests: No results for input(s): "AST", "ALT", "ALKPHOS", "BILITOT", "PROT", "ALBUMIN" in the last 168 hours. No results for input(s): "LIPASE", "AMYLASE" in the last 168 hours. No results for input(s): "AMMONIA" in the last 168 hours. CBC: Recent Labs  Lab 04/01/23 0949 04/02/23 0327 04/03/23 0336  WBC 8.7 10.3 12.3*  NEUTROABS 5.1  --   --   HGB 14.4 13.8 13.9  HCT 41.7 40.3 40.6  MCV 99.5 99.8 98.8  PLT 210 213 208   Cardiac Enzymes: No results for input(s): "CKTOTAL", "CKMB", "CKMBINDEX", "TROPONINI" in the last 168 hours. BNP: Invalid input(s): "POCBNP" CBG: No results for input(s): "GLUCAP" in the last 168 hours. D-Dimer No results for input(s): "DDIMER" in the last 72 hours. Hgb A1c No results for input(s): "HGBA1C" in the last 72 hours. Lipid Profile No results for input(s): "CHOL", "HDL", "LDLCALC", "TRIG", "CHOLHDL", "LDLDIRECT" in the  last 72 hours. Thyroid function studies No results for input(s): "TSH", "T4TOTAL", "T3FREE", "THYROIDAB" in the last 72 hours.  Invalid input(s): "FREET3" Anemia work up No results for input(s): "VITAMINB12", "FOLATE", "FERRITIN", "TIBC", "IRON", "RETICCTPCT" in the last 72 hours. Urinalysis No results found for: "COLORURINE", "APPEARANCEUR", "LABSPEC", "PHURINE", "GLUCOSEU", "HGBUR", "BILIRUBINUR", "KETONESUR", "PROTEINUR", "UROBILINOGEN", "NITRITE", "LEUKOCYTESUR" Sepsis Labs Recent Labs  Lab 04/01/23 0949 04/02/23 0327 04/03/23 0336  WBC 8.7 10.3 12.3*   Microbiology Recent Results (from the past 240 hour(s))  Surgical pcr screen     Status: Abnormal   Collection Time: 04/01/23  3:38 PM   Specimen: Nasal Mucosa; Nasal Swab  Result Value Ref Range Status   MRSA, PCR NEGATIVE NEGATIVE Final   Staphylococcus aureus POSITIVE (A) NEGATIVE Final    Comment: (NOTE) The Xpert SA Assay (FDA approved for NASAL specimens in patients 22 years  of age and older), is one component of a comprehensive surveillance program. It is not intended to diagnose infection nor to guide or monitor treatment. Performed at [facility], [address], [place], [place] [address]      Time coordinating discharge: 35 minutes  SIGNED:   Glade Lloyd, MD  [facility] 04/03/2023, 1:22 PM

## 2023-04-03 NOTE — Progress Notes (Signed)
Physical Therapy Treatment Patient Details Name: Kenneth Pope MRN: [id number] DOB: May 13, 1934 Today's Date: 04/03/2023   History of Present Illness 89 y.o. male admitted 04/01/23 with R hip fx, s/p AA-THA 04/02/23. PMH: HTN, CAD, skin cancer, OA.    PT Comments  Pt ambulated 200' with RW, no loss of balance. Pt demonstrates good understanding of HEP. He is ready to DC home from a PT standpoint. Pt plans to do inhouse PT at his ILF at [facility].     If plan is discharge home, recommend the following: A little help with bathing/dressing/bathroom;Assist for transportation;Help with stairs or ramp for entrance   Can travel by private vehicle        Equipment Recommendations  None recommended by PT    Recommendations for Other Services       Precautions / Restrictions Precautions Precautions: Fall Precaution Comments: fell just prior to admission; denies other falls in past 6 months Restrictions Weight Bearing Restrictions: No Other Position/Activity Restrictions: WBAT RLE     Mobility  Bed Mobility Overal bed mobility: Modified Independent             General bed mobility comments: used gait belt as a leg lifter    Transfers Overall transfer level: Modified independent Equipment used: Rolling walker (2 wheels) Transfers: Sit to/from Stand Sit to Stand: From elevated surface, Modified independent (Device/Increase time)           General transfer comment: good hand placement    Ambulation/Gait Ambulation/Gait assistance: Modified independent (Device/Increase time) Gait Distance (Feet): 200 Feet Assistive device: Rolling walker (2 wheels) Gait Pattern/deviations: Step-through pattern, Decreased stride length Gait velocity: decr     General Gait Details: no loss of balance   Stairs             Wheelchair Mobility     Tilt Bed    Modified Rankin (Stroke Patients Only)       Balance Overall balance assessment: Modified Independent                                           Cognition Arousal: Alert Behavior During Therapy: WFL for tasks assessed/performed Overall Cognitive Status: Within Functional Limits for tasks assessed                                          Exercises Total Joint Exercises Ankle Circles/Pumps: AROM, Both, 10 reps, Supine Quad Sets: AROM, Both, 5 reps, Supine Short Arc Quad: AROM, Right, Supine, 10 reps Heel Slides: AAROM, Right, Supine, 10 reps Hip ABduction/ADduction: AAROM, Right, Supine, 10 reps Long Arc Quad: AROM, Right, Seated, 10 reps    General Comments        Pertinent Vitals/Pain Pain Assessment Pain Assessment: 0-10 Pain Score: 2  Pain Location: R hip with walking Pain Descriptors / Indicators: Sore Pain Intervention(s): Limited activity within patient's tolerance, Monitored during session, Ice applied    Home Living Family/patient expects to be discharged to:: Private residence Living Arrangements: Spouse/significant other Available Help at Discharge: Family;Available 24 hours/day Type of Home: Independent living facility Home Access: Level entry       Home Layout: One level Home Equipment: Agricultural consultant (2 wheels);Cane - single point;Grab bars - toilet;Grab bars - tub/shower Additional Comments: ILF at [facility]  Prior Function            PT Goals (current goals can now be found in the care plan section) Acute Rehab PT Goals Patient Stated Goal: return to independence PT Goal Formulation: With patient/family Time For Goal Achievement: 04/10/23 Potential to Achieve Goals: Good Progress towards PT goals: Goals met/education completed, patient discharged from PT    Frequency    Min 6X/week      PT Plan      Co-evaluation              AM-PAC PT "6 Clicks" Mobility   Outcome Measure  Help needed turning from your back to your side while in a flat bed without using bedrails?: None Help needed moving  from lying on your back to sitting on the side of a flat bed without using bedrails?: A Little Help needed moving to and from a bed to a chair (including a wheelchair)?: None Help needed standing up from a chair using your arms (e.g., wheelchair or bedside chair)?: None Help needed to walk in hospital room?: None Help needed climbing 3-5 steps with a railing? : None 6 Click Score: 23    End of Session Equipment Utilized During Treatment: Gait belt Activity Tolerance: Patient tolerated treatment well Patient left: in chair;with call bell/phone within reach;with family/visitor present Nurse Communication: Mobility status PT Visit Diagnosis: Difficulty in walking, not elsewhere classified (R26.2)     Time: 9147-8295 PT Time Calculation (min) (ACUTE ONLY): 19 min  Charges:    $Gait Training: 8-22 mins $Therapeutic Exercise: 8-22 mins PT General Charges $$ ACUTE PT VISIT: 1 Visit                     Tamala Ser PT 04/03/2023  Acute Rehabilitation Services  Office [phone]

## 2023-04-03 NOTE — TOC Transition Note (Signed)
Transition of Care [facility]) - CM/SW Discharge Note  Patient Details  Name: Kenneth Pope MRN: [id number] Date of Birth: [birth year]  Transition of Care [facility]) CM/SW Contact:  Ewing Schlein, LCSW Phone Number: 04/03/2023, 11:52 AM  Clinical Narrative: PT worked with patient and informed CSW patient did well enough to return to [name] [place]. Patient prefers to do PT through [facility] rather than be set up with HHPT. Patient has a rolling walker at home, so there are no DME needs at this time. CSW notified with [place] at [facility] and she reached out to [place] with PT at the facility so that PT can be set up for the patient after discharge. CSW updated patient. TOC signing off.   Final next level of care: Home/Self Care Barriers to Discharge: Barriers Resolved  Patient Goals and CMS Choice Choice offered to / list presented to : NA  Discharge Plan and Services Additional resources added to the After Visit Summary for   In-house Referral: Clinical Social Work    DME Arranged: N/A DME Agency: NA  Social Determinants of Health (SDOH) Interventions SDOH Screenings   Food Insecurity: No Food Insecurity (04/01/2023)  Housing: Low Risk  (04/01/2023)  Transportation Needs: No Transportation Needs (04/01/2023)  Utilities: Not At Risk (04/01/2023)  Depression (PHQ2-9): Low Risk  (08/31/2018)  Financial Resource Strain: Low Risk  (06/16/2018)  Physical Activity: Insufficiently Active (06/16/2018)  Stress: Stress Concern Present (06/16/2018)  Tobacco Use: Medium Risk (04/02/2023)   Readmission Risk Interventions     No data to display

## 2023-04-04 DIAGNOSIS — R2681 Unsteadiness on feet: Secondary | ICD-10-CM | POA: Diagnosis not present

## 2023-04-04 DIAGNOSIS — Z96641 Presence of right artificial hip joint: Secondary | ICD-10-CM | POA: Diagnosis not present

## 2023-04-04 DIAGNOSIS — R2689 Other abnormalities of gait and mobility: Secondary | ICD-10-CM | POA: Diagnosis not present

## 2023-04-04 DIAGNOSIS — M6281 Muscle weakness (generalized): Secondary | ICD-10-CM | POA: Diagnosis not present

## 2023-04-04 DIAGNOSIS — M84459A Pathological fracture, hip, unspecified, initial encounter for fracture: Secondary | ICD-10-CM | POA: Diagnosis not present

## 2023-04-09 DIAGNOSIS — M6281 Muscle weakness (generalized): Secondary | ICD-10-CM | POA: Diagnosis not present

## 2023-04-09 DIAGNOSIS — R2689 Other abnormalities of gait and mobility: Secondary | ICD-10-CM | POA: Diagnosis not present

## 2023-04-09 DIAGNOSIS — Z96641 Presence of right artificial hip joint: Secondary | ICD-10-CM | POA: Diagnosis not present

## 2023-04-09 DIAGNOSIS — M84459A Pathological fracture, hip, unspecified, initial encounter for fracture: Secondary | ICD-10-CM | POA: Diagnosis not present

## 2023-04-09 DIAGNOSIS — R2681 Unsteadiness on feet: Secondary | ICD-10-CM | POA: Diagnosis not present

## 2023-04-10 DIAGNOSIS — R2681 Unsteadiness on feet: Secondary | ICD-10-CM | POA: Diagnosis not present

## 2023-04-10 DIAGNOSIS — M84459A Pathological fracture, hip, unspecified, initial encounter for fracture: Secondary | ICD-10-CM | POA: Diagnosis not present

## 2023-04-10 DIAGNOSIS — M6281 Muscle weakness (generalized): Secondary | ICD-10-CM | POA: Diagnosis not present

## 2023-04-10 DIAGNOSIS — Z96641 Presence of right artificial hip joint: Secondary | ICD-10-CM | POA: Diagnosis not present

## 2023-04-10 DIAGNOSIS — R2689 Other abnormalities of gait and mobility: Secondary | ICD-10-CM | POA: Diagnosis not present

## 2023-04-13 DIAGNOSIS — M84459A Pathological fracture, hip, unspecified, initial encounter for fracture: Secondary | ICD-10-CM | POA: Diagnosis not present

## 2023-04-13 DIAGNOSIS — R2689 Other abnormalities of gait and mobility: Secondary | ICD-10-CM | POA: Diagnosis not present

## 2023-04-13 DIAGNOSIS — Z96641 Presence of right artificial hip joint: Secondary | ICD-10-CM | POA: Diagnosis not present

## 2023-04-13 DIAGNOSIS — R2681 Unsteadiness on feet: Secondary | ICD-10-CM | POA: Diagnosis not present

## 2023-04-13 DIAGNOSIS — M6281 Muscle weakness (generalized): Secondary | ICD-10-CM | POA: Diagnosis not present

## 2023-04-15 ENCOUNTER — Ambulatory Visit: Payer: Self-pay | Admitting: Student

## 2023-04-15 DIAGNOSIS — R2689 Other abnormalities of gait and mobility: Secondary | ICD-10-CM | POA: Diagnosis not present

## 2023-04-15 DIAGNOSIS — R2681 Unsteadiness on feet: Secondary | ICD-10-CM | POA: Diagnosis not present

## 2023-04-15 DIAGNOSIS — M6281 Muscle weakness (generalized): Secondary | ICD-10-CM | POA: Diagnosis not present

## 2023-04-15 DIAGNOSIS — M84459A Pathological fracture, hip, unspecified, initial encounter for fracture: Secondary | ICD-10-CM | POA: Diagnosis not present

## 2023-04-15 DIAGNOSIS — Z96641 Presence of right artificial hip joint: Secondary | ICD-10-CM | POA: Diagnosis not present

## 2023-04-15 DIAGNOSIS — S72031D Displaced midcervical fracture of right femur, subsequent encounter for closed fracture with routine healing: Secondary | ICD-10-CM | POA: Diagnosis not present

## 2023-04-15 DIAGNOSIS — Z471 Aftercare following joint replacement surgery: Secondary | ICD-10-CM | POA: Diagnosis not present

## 2023-04-16 DIAGNOSIS — I700 Atherosclerosis of aorta: Secondary | ICD-10-CM | POA: Diagnosis not present

## 2023-04-17 DIAGNOSIS — M84459A Pathological fracture, hip, unspecified, initial encounter for fracture: Secondary | ICD-10-CM | POA: Diagnosis not present

## 2023-04-17 DIAGNOSIS — R2689 Other abnormalities of gait and mobility: Secondary | ICD-10-CM | POA: Diagnosis not present

## 2023-04-17 DIAGNOSIS — R2681 Unsteadiness on feet: Secondary | ICD-10-CM | POA: Diagnosis not present

## 2023-04-17 DIAGNOSIS — Z96641 Presence of right artificial hip joint: Secondary | ICD-10-CM | POA: Diagnosis not present

## 2023-04-17 DIAGNOSIS — M6281 Muscle weakness (generalized): Secondary | ICD-10-CM | POA: Diagnosis not present

## 2023-04-21 ENCOUNTER — Encounter (INDEPENDENT_AMBULATORY_CARE_PROVIDER_SITE_OTHER): Payer: Medicare Other | Admitting: Ophthalmology

## 2023-04-21 DIAGNOSIS — I10 Essential (primary) hypertension: Secondary | ICD-10-CM | POA: Diagnosis not present

## 2023-04-21 DIAGNOSIS — H353221 Exudative age-related macular degeneration, left eye, with active choroidal neovascularization: Secondary | ICD-10-CM

## 2023-04-21 DIAGNOSIS — H353112 Nonexudative age-related macular degeneration, right eye, intermediate dry stage: Secondary | ICD-10-CM

## 2023-04-21 DIAGNOSIS — H35033 Hypertensive retinopathy, bilateral: Secondary | ICD-10-CM | POA: Diagnosis not present

## 2023-04-21 DIAGNOSIS — H33301 Unspecified retinal break, right eye: Secondary | ICD-10-CM

## 2023-04-21 DIAGNOSIS — H43813 Vitreous degeneration, bilateral: Secondary | ICD-10-CM

## 2023-04-22 DIAGNOSIS — R2689 Other abnormalities of gait and mobility: Secondary | ICD-10-CM | POA: Diagnosis not present

## 2023-04-22 DIAGNOSIS — M6281 Muscle weakness (generalized): Secondary | ICD-10-CM | POA: Diagnosis not present

## 2023-04-22 DIAGNOSIS — Z96641 Presence of right artificial hip joint: Secondary | ICD-10-CM | POA: Diagnosis not present

## 2023-04-22 DIAGNOSIS — M84459A Pathological fracture, hip, unspecified, initial encounter for fracture: Secondary | ICD-10-CM | POA: Diagnosis not present

## 2023-04-22 DIAGNOSIS — R2681 Unsteadiness on feet: Secondary | ICD-10-CM | POA: Diagnosis not present

## 2023-04-24 DIAGNOSIS — Z96641 Presence of right artificial hip joint: Secondary | ICD-10-CM | POA: Diagnosis not present

## 2023-04-24 DIAGNOSIS — R2689 Other abnormalities of gait and mobility: Secondary | ICD-10-CM | POA: Diagnosis not present

## 2023-04-24 DIAGNOSIS — M84459A Pathological fracture, hip, unspecified, initial encounter for fracture: Secondary | ICD-10-CM | POA: Diagnosis not present

## 2023-04-24 DIAGNOSIS — M6281 Muscle weakness (generalized): Secondary | ICD-10-CM | POA: Diagnosis not present

## 2023-04-24 DIAGNOSIS — R2681 Unsteadiness on feet: Secondary | ICD-10-CM | POA: Diagnosis not present

## 2023-04-25 DIAGNOSIS — R2689 Other abnormalities of gait and mobility: Secondary | ICD-10-CM | POA: Diagnosis not present

## 2023-04-25 DIAGNOSIS — M84459A Pathological fracture, hip, unspecified, initial encounter for fracture: Secondary | ICD-10-CM | POA: Diagnosis not present

## 2023-04-25 DIAGNOSIS — Z96641 Presence of right artificial hip joint: Secondary | ICD-10-CM | POA: Diagnosis not present

## 2023-04-25 DIAGNOSIS — R2681 Unsteadiness on feet: Secondary | ICD-10-CM | POA: Diagnosis not present

## 2023-04-25 DIAGNOSIS — M6281 Muscle weakness (generalized): Secondary | ICD-10-CM | POA: Diagnosis not present

## 2023-04-28 DIAGNOSIS — M84459A Pathological fracture, hip, unspecified, initial encounter for fracture: Secondary | ICD-10-CM | POA: Diagnosis not present

## 2023-04-28 DIAGNOSIS — Z96641 Presence of right artificial hip joint: Secondary | ICD-10-CM | POA: Diagnosis not present

## 2023-04-28 DIAGNOSIS — R2681 Unsteadiness on feet: Secondary | ICD-10-CM | POA: Diagnosis not present

## 2023-04-28 DIAGNOSIS — M6281 Muscle weakness (generalized): Secondary | ICD-10-CM | POA: Diagnosis not present

## 2023-04-28 DIAGNOSIS — R2689 Other abnormalities of gait and mobility: Secondary | ICD-10-CM | POA: Diagnosis not present

## 2023-04-30 DIAGNOSIS — R2689 Other abnormalities of gait and mobility: Secondary | ICD-10-CM | POA: Diagnosis not present

## 2023-04-30 DIAGNOSIS — M84459A Pathological fracture, hip, unspecified, initial encounter for fracture: Secondary | ICD-10-CM | POA: Diagnosis not present

## 2023-04-30 DIAGNOSIS — Z96641 Presence of right artificial hip joint: Secondary | ICD-10-CM | POA: Diagnosis not present

## 2023-04-30 DIAGNOSIS — M6281 Muscle weakness (generalized): Secondary | ICD-10-CM | POA: Diagnosis not present

## 2023-04-30 DIAGNOSIS — R2681 Unsteadiness on feet: Secondary | ICD-10-CM | POA: Diagnosis not present

## 2023-05-01 DIAGNOSIS — Z96641 Presence of right artificial hip joint: Secondary | ICD-10-CM | POA: Diagnosis not present

## 2023-05-01 DIAGNOSIS — M6281 Muscle weakness (generalized): Secondary | ICD-10-CM | POA: Diagnosis not present

## 2023-05-01 DIAGNOSIS — R2681 Unsteadiness on feet: Secondary | ICD-10-CM | POA: Diagnosis not present

## 2023-05-01 DIAGNOSIS — R2689 Other abnormalities of gait and mobility: Secondary | ICD-10-CM | POA: Diagnosis not present

## 2023-05-01 DIAGNOSIS — M84459A Pathological fracture, hip, unspecified, initial encounter for fracture: Secondary | ICD-10-CM | POA: Diagnosis not present

## 2023-05-05 DIAGNOSIS — R2681 Unsteadiness on feet: Secondary | ICD-10-CM | POA: Diagnosis not present

## 2023-05-05 DIAGNOSIS — R2689 Other abnormalities of gait and mobility: Secondary | ICD-10-CM | POA: Diagnosis not present

## 2023-05-05 DIAGNOSIS — Z96641 Presence of right artificial hip joint: Secondary | ICD-10-CM | POA: Diagnosis not present

## 2023-05-05 DIAGNOSIS — M6281 Muscle weakness (generalized): Secondary | ICD-10-CM | POA: Diagnosis not present

## 2023-05-05 DIAGNOSIS — M84459A Pathological fracture, hip, unspecified, initial encounter for fracture: Secondary | ICD-10-CM | POA: Diagnosis not present

## 2023-05-07 ENCOUNTER — Other Ambulatory Visit: Payer: Self-pay | Admitting: Cardiology

## 2023-05-07 DIAGNOSIS — R2681 Unsteadiness on feet: Secondary | ICD-10-CM | POA: Diagnosis not present

## 2023-05-07 DIAGNOSIS — Z96641 Presence of right artificial hip joint: Secondary | ICD-10-CM | POA: Diagnosis not present

## 2023-05-07 DIAGNOSIS — M6281 Muscle weakness (generalized): Secondary | ICD-10-CM | POA: Diagnosis not present

## 2023-05-07 DIAGNOSIS — R2689 Other abnormalities of gait and mobility: Secondary | ICD-10-CM | POA: Diagnosis not present

## 2023-05-07 DIAGNOSIS — M84459A Pathological fracture, hip, unspecified, initial encounter for fracture: Secondary | ICD-10-CM | POA: Diagnosis not present

## 2023-05-07 DIAGNOSIS — E785 Hyperlipidemia, unspecified: Secondary | ICD-10-CM

## 2023-05-08 DIAGNOSIS — M6281 Muscle weakness (generalized): Secondary | ICD-10-CM | POA: Diagnosis not present

## 2023-05-08 DIAGNOSIS — R2689 Other abnormalities of gait and mobility: Secondary | ICD-10-CM | POA: Diagnosis not present

## 2023-05-08 DIAGNOSIS — Z96641 Presence of right artificial hip joint: Secondary | ICD-10-CM | POA: Diagnosis not present

## 2023-05-08 DIAGNOSIS — R2681 Unsteadiness on feet: Secondary | ICD-10-CM | POA: Diagnosis not present

## 2023-05-08 DIAGNOSIS — M84459A Pathological fracture, hip, unspecified, initial encounter for fracture: Secondary | ICD-10-CM | POA: Diagnosis not present

## 2023-05-12 DIAGNOSIS — R2681 Unsteadiness on feet: Secondary | ICD-10-CM | POA: Diagnosis not present

## 2023-05-12 DIAGNOSIS — Z96641 Presence of right artificial hip joint: Secondary | ICD-10-CM | POA: Diagnosis not present

## 2023-05-12 DIAGNOSIS — R2689 Other abnormalities of gait and mobility: Secondary | ICD-10-CM | POA: Diagnosis not present

## 2023-05-12 DIAGNOSIS — M6281 Muscle weakness (generalized): Secondary | ICD-10-CM | POA: Diagnosis not present

## 2023-05-12 DIAGNOSIS — M84459A Pathological fracture, hip, unspecified, initial encounter for fracture: Secondary | ICD-10-CM | POA: Diagnosis not present

## 2023-05-13 DIAGNOSIS — R2689 Other abnormalities of gait and mobility: Secondary | ICD-10-CM | POA: Diagnosis not present

## 2023-05-13 DIAGNOSIS — M84459A Pathological fracture, hip, unspecified, initial encounter for fracture: Secondary | ICD-10-CM | POA: Diagnosis not present

## 2023-05-13 DIAGNOSIS — Z471 Aftercare following joint replacement surgery: Secondary | ICD-10-CM | POA: Diagnosis not present

## 2023-05-13 DIAGNOSIS — M6281 Muscle weakness (generalized): Secondary | ICD-10-CM | POA: Diagnosis not present

## 2023-05-13 DIAGNOSIS — Z96641 Presence of right artificial hip joint: Secondary | ICD-10-CM | POA: Diagnosis not present

## 2023-05-13 DIAGNOSIS — S72031D Displaced midcervical fracture of right femur, subsequent encounter for closed fracture with routine healing: Secondary | ICD-10-CM | POA: Diagnosis not present

## 2023-05-13 DIAGNOSIS — R2681 Unsteadiness on feet: Secondary | ICD-10-CM | POA: Diagnosis not present

## 2023-05-14 DIAGNOSIS — R2689 Other abnormalities of gait and mobility: Secondary | ICD-10-CM | POA: Diagnosis not present

## 2023-05-14 DIAGNOSIS — R2681 Unsteadiness on feet: Secondary | ICD-10-CM | POA: Diagnosis not present

## 2023-05-14 DIAGNOSIS — M6281 Muscle weakness (generalized): Secondary | ICD-10-CM | POA: Diagnosis not present

## 2023-05-14 DIAGNOSIS — Z96641 Presence of right artificial hip joint: Secondary | ICD-10-CM | POA: Diagnosis not present

## 2023-05-14 DIAGNOSIS — M84459A Pathological fracture, hip, unspecified, initial encounter for fracture: Secondary | ICD-10-CM | POA: Diagnosis not present

## 2023-05-20 ENCOUNTER — Ambulatory Visit: Payer: Medicare Other | Admitting: Internal Medicine

## 2023-05-20 ENCOUNTER — Encounter: Payer: Self-pay | Admitting: Internal Medicine

## 2023-05-20 VITALS — BP 134/72 | HR 68 | Ht 68.0 in | Wt 161.0 lb

## 2023-05-20 DIAGNOSIS — R0989 Other specified symptoms and signs involving the circulatory and respiratory systems: Secondary | ICD-10-CM | POA: Diagnosis not present

## 2023-05-20 DIAGNOSIS — J849 Interstitial pulmonary disease, unspecified: Secondary | ICD-10-CM | POA: Diagnosis not present

## 2023-05-20 LAB — SEDIMENTATION RATE: Sed Rate: 31 mm/h — ABNORMAL HIGH (ref 0–20)

## 2023-05-20 NOTE — Progress Notes (Signed)
OV 05/20/2023  Subjective:  Patient ID: Kenneth Pope, male , DOB: Jul 16, 1933 , age 89 y.o. , MRN: [id number] , ADDRESS: [address] [place] [address] PCP Adrian Prince, MD Patient Care Team: Adrian Prince, MD as PCP - General (Endocrinology) Rollene Rotunda, MD as PCP - Cardiology (Cardiology) Janalyn Harder, MD (Inactive) as Consulting Physician (Dermatology)  This Provider for this visit: Treatment Team:  Attending Provider: Kalman Shan, MD    05/20/2023 -   Chief Complaint  Patient presents with   Consult     HPI Kenneth Pope 89 y.o. -send new consultation referred by Dr. Jeannett Senior self.  History is provided by the patient.  He did see Dr. Jeannett Senior so 04/16/2023 and he was concerned about interstitial lung disease on that visit.  But patient tells me that he is not sure why he is here.  He says he believes it is for "preventative maintenance".  He denies any respiratory symptoms.  He denies shortness of breath or chest pain even with exertion such as stairs.  He did have a right hip replacement mid October 2024 and even before that while climbing stairs and even after that while climbing stairs he has not had any shortness of breath or chest pain.  Definitely no shortness of breath for changing clothes or eating.  No cough no wheezing no orthopnea no paroxysmal nocturnal dyspnea.  His main recent medical history was 04/01/2019 for closed right hip fracture following a mechanical fall.  He is known to have coronary artery disease status post stent in 2019 on daily aspirin.  Did see Dr. Antoine Poche 02/20/2023.  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  He lives with his wife.  He has a daughter who lives in [place].  He quit smoking 1976.  He denies any cancers or COPD or stroke he is to play golf but not recently.  He does have hypertension and hyperlipidemia.  He is otherwise active.  Chart review shows he is full  code.  I believe the concern for ILD appeared because on 12/04/2022 he had atypical chest pain and he had CT dissection study.  This in my visualization shows ILD.  He also has crackles on exam.  However it was a contrast CT.  Most recent labs 04/03/2023 shows normal creatinine and normal hemoglobin.  CT Chest data from date: June 2024  - personally visualized and independently interpreted : yes - my findings are: as below Narrative & Impression  CLINICAL DATA:  Chest pain without relief from nitro   EXAM: CT ANGIOGRAPHY CHEST, ABDOMEN AND PELVIS   TECHNIQUE: Non-contrast CT of the chest was initially obtained.   Multidetector CT imaging through the chest, abdomen and pelvis was performed using the standard protocol during bolus administration of intravenous contrast. Multiplanar reconstructed images and MIPs were obtained and reviewed to evaluate the vascular anatomy.   RADIATION DOSE REDUCTION: This exam was performed according to the departmental dose-optimization program which includes automated exposure control, adjustment of the mA and/or kV according to patient size and/or use of iterative reconstruction technique.   CONTRAST:  OMNIPAQUE IOHEXOL 350 MG/ML SOLN   COMPARISON:  Chest radiographs 12/04/2022 and CT abdomen and pelvis 11/26/2022   FINDINGS: CTA CHEST FINDINGS   Cardiovascular: Preferential opacification of the thoracic aorta. No evidence of thoracic aortic aneurysm or dissection. Cardiomegaly. No pericardial effusion. Aortic atherosclerotic calcification.   Mediastinum/Nodes: No enlarged mediastinal, hilar, or  axillary lymph nodes. Thyroid gland, trachea, and esophagus demonstrate no significant findings.   Lungs/Pleura: Subpleural reticular and ground-glass opacities compatible with interstitial lung disease greatest in the lingula and left lower lobe. Early honeycombing in the lingula. No focal consolidation, pleural effusion, or pneumothorax.    Musculoskeletal: No acute fracture or destructive osseous lesion.   Review of the MIP images confirms the above findings.   CTA ABDOMEN AND PELVIS FINDINGS   VASCULAR   Aorta: Calcified atherosclerotic plaque without significant narrowing. No aneurysm or dissection.   Celiac: Calcified plaque at the origin causes moderate narrowing. No aneurysm or dissection.   SMA: Calcified plaque at the origin causes mild narrowing. No aneurysm or dissection.   Renals: Patent right renal artery without aneurysm or dissection. Calcified plaque at the origin of the left renal artery causes at least moderate narrowing. No aneurysm or dissection.   IMA: Patent.   Inflow: Scattered calcified atherosclerotic plaque without significant narrowing. No aneurysm or dissection.   Veins: No obvious venous abnormality within the limitations of this arterial phase study.   Review of the MIP images confirms the above findings.   NON-VASCULAR   Hepatobiliary: Unremarkable liver, gallbladder, and biliary tree.   Pancreas: Unremarkable.   Spleen: Unremarkable.   Adrenals/Urinary Tract: Normal adrenal glands. No urinary calculi or hydronephrosis. Unremarkable bladder.   Stomach/Bowel: Normal caliber large and small bowel. No bowel wall thickening. Normal appendix. Stomach is within normal limits.   Lymphatic: No lymphadenopathy.   Reproductive: Enlarged prostate.   Other: No free intraperitoneal fluid or air.   Musculoskeletal: No acute fracture or destructive osseous lesion.   Review of the MIP images confirms the above findings.   IMPRESSION: 1. No evidence of aortic aneurysm or dissection. 2. No acute abnormality in the chest, abdomen, or pelvis. 3. Chronic interstitial lung disease. 4. Prostatomegaly.     Electronically Signed   By: Minerva Fester M.D.   On: 12/04/2022 22:18    PFT      No data to display            LAB RESULTS last 96 hours No results  found.  LAB RESULTS last 90 days Recent Results (from the past 2160 hour(s))  Basic metabolic panel     Status: Abnormal   Collection Time: 04/01/23  9:49 AM  Result Value Ref Range   Sodium 135 135 - 145 mmol/L   Potassium 3.8 3.5 - 5.1 mmol/L   Chloride 103 98 - 111 mmol/L   CO2 24 22 - 32 mmol/L   Glucose, Bld 134 (H) 70 - 99 mg/dL    Comment: Glucose reference range applies only to samples taken after fasting for at least 8 hours.   BUN 13 8 - 23 mg/dL   Creatinine, Ser 6.27 0.61 - 1.24 mg/dL   Calcium 8.7 (L) 8.9 - 10.3 mg/dL   GFR, Estimated >03 >50 mL/min    Comment: (NOTE) Calculated using the CKD-EPI Creatinine Equation (2021)    Anion gap 8 5 - 15    Comment: Performed at [facility], [address] [place], [place] [address]  CBC with Differential     Status: Abnormal   Collection Time: 04/01/23  9:49 AM  Result Value Ref Range   WBC 8.7 4.0 - 10.5 K/uL   RBC 4.19 (L) 4.22 - 5.81 MIL/uL   Hemoglobin 14.4 13.0 - 17.0 g/dL   HCT 82.9 93.7 - 16.9 %   MCV 99.5 80.0 - 100.0 fL  MCH 34.4 (H) 26.0 - 34.0 pg   MCHC 34.5 30.0 - 36.0 g/dL   RDW 29.5 28.4 - 13.2 %   Platelets 210 150 - 400 K/uL   nRBC 0.0 0.0 - 0.2 %   Neutrophils Relative % 59 %   Neutro Abs 5.1 1.7 - 7.7 K/uL   Lymphocytes Relative 26 %   Lymphs Abs 2.2 0.7 - 4.0 K/uL   Monocytes Relative 11 %   Monocytes Absolute 1.0 0.1 - 1.0 K/uL   Eosinophils Relative 3 %   Eosinophils Absolute 0.3 0.0 - 0.5 K/uL   Basophils Relative 1 %   Basophils Absolute 0.1 0.0 - 0.1 K/uL   Immature Granulocytes 0 %   Abs Immature Granulocytes 0.03 0.00 - 0.07 K/uL    Comment: Performed at [facility], [address], [place], [place] [address]  Surgical pcr screen     Status: Abnormal   Collection Time: 04/01/23  3:38 PM   Specimen: Nasal Mucosa; Nasal Swab  Result Value Ref Range   MRSA, PCR NEGATIVE NEGATIVE   Staphylococcus aureus POSITIVE (A) NEGATIVE    Comment:  (NOTE) The Xpert SA Assay (FDA approved for NASAL specimens in patients 84 years of age and older), is one component of a comprehensive surveillance program. It is not intended to diagnose infection nor to guide or monitor treatment. Performed at [facility], [address], [place], [place] [address]   Basic metabolic panel     Status: Abnormal   Collection Time: 04/02/23  3:27 AM  Result Value Ref Range   Sodium 135 135 - 145 mmol/L   Potassium 3.4 (L) 3.5 - 5.1 mmol/L   Chloride 98 98 - 111 mmol/L   CO2 29 22 - 32 mmol/L   Glucose, Bld 109 (H) 70 - 99 mg/dL    Comment: Glucose reference range applies only to samples taken after fasting for at least 8 hours.   BUN 13 8 - 23 mg/dL   Creatinine, Ser 6.64 0.61 - 1.24 mg/dL   Calcium 8.5 (L) 8.9 - 10.3 mg/dL   GFR, Estimated >40 >34 mL/min    Comment: (NOTE) Calculated using the CKD-EPI Creatinine Equation (2021)    Anion gap 8 5 - 15    Comment: Performed at [facility], [address] [place], [place] [address]  CBC     Status: Abnormal   Collection Time: 04/02/23  3:27 AM  Result Value Ref Range   WBC 10.3 4.0 - 10.5 K/uL   RBC 4.04 (L) 4.22 - 5.81 MIL/uL   Hemoglobin 13.8 13.0 - 17.0 g/dL   HCT 56.3 87.5 - 64.3 %   MCV 99.8 80.0 - 100.0 fL   MCH 34.2 (H) 26.0 - 34.0 pg   MCHC 34.2 30.0 - 36.0 g/dL   RDW 32.9 51.8 - 84.1 %   Platelets 213 150 - 400 K/uL   nRBC 0.0 0.0 - 0.2 %    Comment: Performed at [facility], [address] [place], [place] [address]  ABO/Rh     Status: None   Collection Time: 04/02/23  3:27 AM  Result Value Ref Range   ABO/RH(D)      A POS Performed at [facility], [address] [place], [place] [address]   Type and screen [facility]     Status: None   Collection Time: 04/02/23 10:04 AM  Result Value Ref Range   ABO/RH(D) A  POS    Antibody Screen NEG    Sample Expiration       04/05/2023,2359 Performed at [facility], [address], [place], [place] [address]   CBC     Status: Abnormal   Collection Time: 04/03/23  3:36 AM  Result Value Ref Range   WBC 12.3 (H) 4.0 - 10.5 K/uL   RBC 4.11 (L) 4.22 - 5.81 MIL/uL   Hemoglobin 13.9 13.0 - 17.0 g/dL   HCT 09.8 11.9 - 14.7 %   MCV 98.8 80.0 - 100.0 fL   MCH 33.8 26.0 - 34.0 pg   MCHC 34.2 30.0 - 36.0 g/dL   RDW 82.9 56.2 - 13.0 %   Platelets 208 150 - 400 K/uL   nRBC 0.0 0.0 - 0.2 %    Comment: Performed at [facility], [address] [place], [place] [address]  Basic metabolic panel     Status: Abnormal   Collection Time: 04/03/23  3:36 AM  Result Value Ref Range   Sodium 131 (L) 135 - 145 mmol/L   Potassium 3.5 3.5 - 5.1 mmol/L   Chloride 97 (L) 98 - 111 mmol/L   CO2 24 22 - 32 mmol/L   Glucose, Bld 156 (H) 70 - 99 mg/dL    Comment: Glucose reference range applies only to samples taken after fasting for at least 8 hours.   BUN 16 8 - 23 mg/dL   Creatinine, Ser 4.69 0.61 - 1.24 mg/dL   Calcium 8.4 (L) 8.9 - 10.3 mg/dL   GFR, Estimated >62 >95 mL/min    Comment: (NOTE) Calculated using the CKD-EPI Creatinine Equation (2021)    Anion gap 10 5 - 15    Comment: Performed at [facility], 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   reports that he quit smoking about 48  years ago. His smoking use included cigarettes. He started smoking about 63 years ago. He has never used smokeless tobacco.  Past Surgical History:  Procedure Laterality Date   CARDIAC CATHETERIZATION     CORONARY STENT INTERVENTION N/A 05/05/2018   Procedure: CORONARY STENT INTERVENTION;  Surgeon: Lennette Bihari, MD;  Location: [facility] INVASIVE [facility] LAB;  Service: Cardiovascular;  Laterality: N/A;   HERNIA REPAIR     LEFT HEART CATH AND CORONARY ANGIOGRAPHY N/A 05/05/2018   Procedure: LEFT HEART CATH AND CORONARY ANGIOGRAPHY;  Surgeon: Lennette Bihari, MD;  Location: [facility] INVASIVE CV LAB;  Service: Cardiovascular;  Laterality: N/A;   TOTAL HIP ARTHROPLASTY Right 04/02/2023   Procedure: TOTAL HIP ARTHROPLASTY ANTERIOR APPROACH VS HEMI;  Surgeon: Samson Frederic, MD;  Location: [facility] ORS;  Service: Orthopedics;  Laterality: Right;    No Known Allergies  Immunization History  Administered Date(s) Administered   PFIZER(Purple Top)SARS-COV-2 Vaccination 07/02/2019, 07/24/2019    Family History  Problem Relation Age of Onset   CAD Neg Hx      Current Outpatient Medications:    acetaminophen (TYLENOL) 500 MG tablet, Take 500 mg by mouth as needed  for headache., Disp: , Rfl:    amLODipine (NORVASC) 10 MG tablet, TAKE 1 TABLET BY MOUTH EVERY DAY (Patient taking differently: Take 10 mg by mouth daily.), Disp: 90 tablet, Rfl: 2   atorvastatin (LIPITOR) 80 MG tablet, TAKE 1 TABLET(80 MG) BY MOUTH DAILY AT 6 PM, Disp: 90 tablet, Rfl: 3   Calcium Carb-Cholecalciferol (CALCIUM 600+D3 PO), Take 1 tablet by mouth daily after breakfast., Disp: , Rfl:    carvedilol (COREG) 3.125 MG tablet, Take 1 tablet (3.125 mg total) by mouth 2 (two) times daily with a meal., Disp: 180 tablet, Rfl: 3   docusate sodium (COLACE) 100 MG capsule, Take 1 capsule (100 mg total) by mouth 2 (two) times daily., Disp: 30 capsule, Rfl: 0   dorzolamide-timolol (COSOPT) 22.3-6.8 MG/ML ophthalmic solution, Place 1 drop into both eyes See  admin instructions. Instill 1 drop into each eye at 10 AM and 5 PM, Disp: , Rfl:    Multiple Vitamin (MULTIVITAMIN WITH MINERALS) TABS tablet, Take 1 tablet by mouth daily with breakfast., Disp: , Rfl:    Multiple Vitamins-Minerals (PRESERVISION AREDS 2) CAPS, Take 1 capsule by mouth 2 (two) times daily. , Disp: , Rfl:    nitroGLYCERIN (NITROSTAT) 0.4 MG SL tablet, Place 1 tablet (0.4 mg total) under the tongue every 5 (five) minutes as needed for chest pain., Disp: 25 tablet, Rfl: 11   pantoprazole (PROTONIX) 40 MG tablet, Take 40 mg by mouth daily., Disp: , Rfl:    polyethylene glycol (MIRALAX / GLYCOLAX) 17 g packet, Take 17 g by mouth daily as needed for mild constipation., Disp: 14 each, Rfl: 0   trimethoprim-polymyxin b (POLYTRIM) ophthalmic solution, Place 1 drop into the left eye See admin instructions. Instill 1 drop into the left eye four times a day for 2 days after injection, Disp: , Rfl:       Objective:   Vitals:   05/20/23 0848  BP: 134/72  Pulse: 68  SpO2: 96%  Weight: 161 lb (73 kg)  Height: 5\' 8"  (1.727 m)    Estimated body mass index is 24.48 kg/m as calculated from the following:   Height as of this encounter: 5\' 8"  (1.727 m).   Weight as of this encounter: 161 lb (73 kg).  @WEIGHTCHANGE @  Filed Weights   05/20/23 0848  Weight: 161 lb (73 kg)     Physical Exam   General: No distress. Looks well O2 at rest: no Cane present: no Sitting in wheel chair: no Frail: no Obese: no Neuro: Alert and Oriented x 3. GCS 15. Speech normal Psych: Pleasant Resp:  Barrel Chest - no.  Wheeze - no, Crackles - YES, No overt respiratory distress CVS: Normal heart sounds. Murmurs - non Ext: Stigmata of Connective Tissue Disease - no HEENT: Normal upper airway. PEERL +. No post nasal drip        Assessment:       ICD-10-CM   1. ILD (interstitial lung disease) (HCC)  J84.9 Pulmonary function test    CT Chest High Resolution    Sed Rate (ESR)    ANA+ENA+DNA/DS+Scl  70+SjoSSA/B    CK Total (and CKMB)    Aldolase    QuantiFERON-TB Gold Plus    Hypersensitivity Pneumonitis    CK Total (and CKMB)    Sed Rate (ESR)    2. Bibasilar crackles  R09.89          Plan:     Patient Instructions     ICD-10-CM   1. ILD (interstitial  lung disease) (HCC)  J84.9       - I am concerned you  have Interstitial Lung Disease (ILD)  -  There are MANY varieties of this - To narrow down possibilities and assess severity please do the following  PLAN  - do ILD question packet at home and bring it back with you at next visit - - do full PFT  -- do High Resolution CT chest wo contrast - supine and prone, inspiratory and expiratory images (only Dr Fredirick Lathe or Dr Llana Aliment or Dr Ashley Murrain or Dr Jayme Cloud or Dr Dorothey Baseman to read)  - do following blood work 05/20/2023   -  autoimmune panel: Serum: ESR, ANA, DS-DNA, RF, anti-CCP, ssA, ssB, scl-70, Total CK,  Aldolase,   - do serum Hypersensitivity Pneumonitis Panel   - do blood  Quantiferon Gold   - do simple walk test at next visit   Followup  - next 8 weeks with DR Marchelle Gearing; 30 min visit to discuss the above     FOLLOWUP Return in about 8 weeks (around 07/15/2023) for 30 min visit, after Cleda Daub and DLCO, ILD, with Dr Marchelle Gearing, after HRCT chest, Face to Face Visit.    SIGNATURE    Dr. Kalman Shan, M.D., F.C.C.P,  Pulmonary and Critical Care Medicine Staff Physician, [facility] Center Director - Interstitial Lung Disease  Program  Pulmonary Fibrosis [facility] at [facility] [place], [place], [address]  Pager: [phone], If no answer or between  15:00h - 7:00h: call [phone] Telephone: [phone]  9:17 AM 05/20/2023

## 2023-05-20 NOTE — Addendum Note (Signed)
Addended by: Winn Jock on: 05/20/2023 09:26 AM   Modules accepted: Orders

## 2023-05-20 NOTE — Patient Instructions (Addendum)
ICD-10-CM   1. ILD (interstitial lung disease) (HCC)  J84.9       - I am concerned you  have Interstitial Lung Disease (ILD)  -  There are MANY varieties of this - To narrow down possibilities and assess severity please do the following  PLAN  - do ILD question packet at home and bring it back with you at next visit - - do full PFT  -- do High Resolution CT chest wo contrast - supine and prone, inspiratory and expiratory images (only Dr Fredirick Lathe or Dr Llana Aliment or Dr Ashley Murrain or Dr Jayme Cloud or Dr Dorothey Baseman to read)  - do following blood work 05/20/2023   -  autoimmune panel: Serum: ESR, ANA, DS-DNA, RF, anti-CCP, ssA, ssB, scl-70, Total CK,  Aldolase,   - do serum Hypersensitivity Pneumonitis Panel   - do blood  Quantiferon Gold   - do simple walk test at next visit   Followup  - next 8 weeks with DR Marchelle Gearing; 30 min visit to discuss the above

## 2023-05-22 LAB — CK TOTAL AND CKMB (NOT AT ARMC)
CK, MB: <0.7 ng/mL (ref 0–5.0)
Total CK: 57 U/L (ref 44–196)

## 2023-05-22 LAB — ALDOLASE: Aldolase: 3.9 U/L (ref <=8.1)

## 2023-05-23 LAB — HYPERSENSITIVITY PNEUMONITIS
A. Pullulans Abs: NEGATIVE
A.Fumigatus #1 Abs: NEGATIVE
Micropolyspora faeni, IgG: NEGATIVE
Pigeon Serum Abs: NEGATIVE
Thermoact. Saccharii: NEGATIVE
Thermoactinomyces vulgaris, IgG: NEGATIVE

## 2023-05-23 LAB — ANA+ENA+DNA/DS+SCL 70+SJOSSA/B
ANA Titer 1: POSITIVE — AB
ENA RNP Ab: 0.2 AI (ref 0.0–0.9)
ENA SM Ab Ser-aCnc: <0.2 AI (ref 0.0–0.9)
ENA SSA (RO) Ab: <0.2 AI (ref 0.0–0.9)
ENA SSB (LA) Ab: 0.2 AI (ref 0.0–0.9)
Scleroderma (Scl-70) (ENA) Antibody, IgG: <0.2 AI (ref 0.0–0.9)
dsDNA Ab: 1 [IU]/mL (ref 0–9)

## 2023-05-23 LAB — QUANTIFERON-TB GOLD PLUS
Mitogen-NIL: >10 [IU]/mL
NIL: 0.06 [IU]/mL
QuantiFERON-TB Gold Plus: NEGATIVE
TB1-NIL: 0.01 [IU]/mL
TB2-NIL: 0.03 [IU]/mL

## 2023-05-23 LAB — ALDOLASE

## 2023-05-23 LAB — FANA STAINING PATTERNS: Speckled Pattern: 1:320 {titer} — ABNORMAL HIGH

## 2023-05-26 ENCOUNTER — Encounter (INDEPENDENT_AMBULATORY_CARE_PROVIDER_SITE_OTHER): Payer: Medicare Other | Admitting: Ophthalmology

## 2023-05-26 DIAGNOSIS — H353112 Nonexudative age-related macular degeneration, right eye, intermediate dry stage: Secondary | ICD-10-CM | POA: Diagnosis not present

## 2023-05-26 DIAGNOSIS — I10 Essential (primary) hypertension: Secondary | ICD-10-CM

## 2023-05-26 DIAGNOSIS — H353221 Exudative age-related macular degeneration, left eye, with active choroidal neovascularization: Secondary | ICD-10-CM

## 2023-05-26 DIAGNOSIS — H33301 Unspecified retinal break, right eye: Secondary | ICD-10-CM

## 2023-05-26 DIAGNOSIS — H43813 Vitreous degeneration, bilateral: Secondary | ICD-10-CM

## 2023-05-26 DIAGNOSIS — H35033 Hypertensive retinopathy, bilateral: Secondary | ICD-10-CM | POA: Diagnosis not present

## 2023-05-27 NOTE — Progress Notes (Signed)
Mild elevation in ANA. Will discuss at followup

## 2023-06-30 ENCOUNTER — Encounter (INDEPENDENT_AMBULATORY_CARE_PROVIDER_SITE_OTHER): Payer: Medicare Other | Admitting: Ophthalmology

## 2023-06-30 DIAGNOSIS — H353221 Exudative age-related macular degeneration, left eye, with active choroidal neovascularization: Secondary | ICD-10-CM

## 2023-06-30 DIAGNOSIS — H35033 Hypertensive retinopathy, bilateral: Secondary | ICD-10-CM

## 2023-06-30 DIAGNOSIS — H353112 Nonexudative age-related macular degeneration, right eye, intermediate dry stage: Secondary | ICD-10-CM

## 2023-06-30 DIAGNOSIS — H43813 Vitreous degeneration, bilateral: Secondary | ICD-10-CM

## 2023-06-30 DIAGNOSIS — H33301 Unspecified retinal break, right eye: Secondary | ICD-10-CM

## 2023-06-30 DIAGNOSIS — I10 Essential (primary) hypertension: Secondary | ICD-10-CM | POA: Diagnosis not present

## 2023-07-01 ENCOUNTER — Ambulatory Visit (HOSPITAL_COMMUNITY)
Admission: RE | Admit: 2023-07-01 | Discharge: 2023-07-01 | Disposition: A | Discharge status: Home / Self Care | Payer: Medicare Other | Source: Ambulatory Visit | Attending: Internal Medicine | Admitting: Internal Medicine

## 2023-07-01 DIAGNOSIS — J432 Centrilobular emphysema: Secondary | ICD-10-CM | POA: Diagnosis not present

## 2023-07-01 DIAGNOSIS — J849 Interstitial pulmonary disease, unspecified: Secondary | ICD-10-CM | POA: Diagnosis not present

## 2023-07-01 DIAGNOSIS — I7121 Aneurysm of the ascending aorta, without rupture: Secondary | ICD-10-CM | POA: Diagnosis not present

## 2023-07-01 DIAGNOSIS — I700 Atherosclerosis of aorta: Secondary | ICD-10-CM | POA: Diagnosis not present

## 2023-07-01 DIAGNOSIS — J8410 Pulmonary fibrosis, unspecified: Secondary | ICD-10-CM | POA: Diagnosis not present

## 2023-07-30 ENCOUNTER — Ambulatory Visit: Payer: Medicare Other | Admitting: Internal Medicine

## 2023-07-30 ENCOUNTER — Encounter: Payer: Self-pay | Admitting: Internal Medicine

## 2023-07-30 VITALS — BP 100/62 | HR 66 | Temp 98.0°F | Ht 68.0 in | Wt 162.0 lb

## 2023-07-30 DIAGNOSIS — R768 Other specified abnormal immunological findings in serum: Secondary | ICD-10-CM | POA: Diagnosis not present

## 2023-07-30 DIAGNOSIS — J84112 Idiopathic pulmonary fibrosis: Secondary | ICD-10-CM | POA: Diagnosis not present

## 2023-07-30 DIAGNOSIS — J849 Interstitial pulmonary disease, unspecified: Secondary | ICD-10-CM

## 2023-07-30 LAB — PULMONARY FUNCTION TEST
DL/VA % pred: 83 %
DL/VA: 3.19 ml/min/mmHg/L
DLCO cor % pred: 52 %
DLCO cor: 11.67 ml/min/mmHg
DLCO unc % pred: 52 %
DLCO unc: 11.67 ml/min/mmHg
FEF 25-75 Post: 1.62 L/s
FEF 25-75 Pre: 1.71 L/s
FEF2575-%Change-Post: -5 %
FEF2575-%Pred-Post: 112 %
FEF2575-%Pred-Pre: 118 %
FEV1-%Change-Post: 0 %
FEV1-%Pred-Post: 82 %
FEV1-%Pred-Pre: 82 %
FEV1-Post: 1.91 L
FEV1-Pre: 1.91 L
FEV1FVC-%Change-Post: 0 %
FEV1FVC-%Pred-Pre: 112 %
FEV6-%Change-Post: 0 %
FEV6-%Pred-Post: 78 %
FEV6-%Pred-Pre: 77 %
FEV6-Post: 2.43 L
FEV6-Pre: 2.42 L
FEV6FVC-%Pred-Post: 108 %
FEV6FVC-%Pred-Pre: 108 %
FVC-%Change-Post: 0 %
FVC-%Pred-Post: 71 %
FVC-%Pred-Pre: 71 %
FVC-Post: 2.43 L
FVC-Pre: 2.43 L
Post FEV1/FVC ratio: 79 %
Post FEV6/FVC ratio: 100 %
Pre FEV1/FVC ratio: 79 %
Pre FEV6/FVC Ratio: 100 %
RV % pred: 59 %
RV: 1.6 L
TLC % pred: 60 %
TLC: 4.01 L

## 2023-07-30 NOTE — Patient Instructions (Signed)
Full PFT performed today.

## 2023-07-30 NOTE — Progress Notes (Signed)
Full PFT performed today.

## 2023-07-30 NOTE — Patient Instructions (Addendum)
ICD-10-CM   1. IPF (idiopathic pulmonary fibrosis) (HCC)  J84.112       -Given your diagnosis of IPF based on age greater than 67, male gender, Caucasian ethnicity, autoimmune panel negative except for slight positive ANA, absence of connective tissue disease, description of probable UIP [although I think it is definitive UIP] on CT scan  -Positive ANA most likely age-related.  Currently observation recommended in the absence of significant connective tissue disease.  PLAN -based on detailed shared decision making  -Respect no antifibrotic standard of care therapy -Respect no intervention clinical trials -Support observation and possible intervention if there is decline -He should do interest in participation in IPF-PRO registry.  Followup  -6 months spirometry and dlco; 15 min visit Kenneth Pope

## 2023-07-30 NOTE — Progress Notes (Signed)
OV 05/20/2023  Subjective:  Patient ID: Kenneth Pope, male , DOB: April 04, 1934 , age 89 y.o. , MRN: [id number] , ADDRESS: [address] [place] [address] PCP Adrian Prince, MD Patient Care Team: Adrian Prince, MD as PCP - General (Endocrinology) Rollene Rotunda, MD as PCP - Cardiology (Cardiology) Janalyn Harder, MD (Inactive) as Consulting Physician (Dermatology)  This Provider for this visit: Treatment Team:  Attending Provider: Kalman Shan, MD    05/20/2023 -   Chief Complaint  Patient presents with   Consult     HPI Kenneth Pope 89 y.o. -send new consultation referred by Dr. Jeannett Senior self.  History is provided by the patient.  He did see Dr. Jeannett Senior so 04/16/2023 and he was concerned about interstitial lung disease on that visit.  But patient tells me that he is not sure why he is here.  He says he believes it is for "preventative maintenance".  He denies any respiratory symptoms.  He denies shortness of breath or chest pain even with exertion such as stairs.  He did have a right hip replacement mid October 2024 and even before that while climbing stairs and even after that while climbing stairs he has not had any shortness of breath or chest pain.  Definitely no shortness of breath for changing clothes or eating.  No cough no wheezing no orthopnea no paroxysmal nocturnal dyspnea.  His main recent medical history was 04/01/2019 for closed right hip fracture following a mechanical fall.  He is known to have coronary artery disease status post stent in 2019 on daily aspirin.  Did see Dr. Antoine Poche 02/20/2023.  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  He lives with his wife.  He has a daughter who lives in [place].  He quit smoking 1976.  He denies any cancers or COPD or stroke he is to play golf but not recently.  He does have hypertension and hyperlipidemia.  He is otherwise active.  Chart review shows he is full  code.  I believe the concern for ILD appeared because on 12/04/2022 he had atypical chest pain and he had CT dissection study.  This in my visualization shows ILD.  He also has crackles on exam.  However it was a contrast CT.  Most recent labs 04/03/2023 shows normal creatinine and normal hemoglobin.  CT Chest data from date: June 2024  - personally visualized and independently interpreted : yes - my findings are: as below Narrative & Impression  CLINICAL DATA:  Chest pain without relief from nitro   EXAM: CT ANGIOGRAPHY CHEST, ABDOMEN AND PELVIS   TECHNIQUE: Non-contrast CT of the chest was initially obtained.   Multidetector CT imaging through the chest, abdomen and pelvis was performed using the standard protocol during bolus administration of intravenous contrast. Multiplanar reconstructed images and MIPs were obtained and reviewed to evaluate the vascular anatomy.   RADIATION DOSE REDUCTION: This exam was performed according to the departmental dose-optimization program which includes automated exposure control, adjustment of the mA and/or kV according to patient size and/or use of iterative reconstruction technique.   CONTRAST:  OMNIPAQUE IOHEXOL 350 MG/ML SOLN   COMPARISON:  Chest radiographs 12/04/2022 and CT abdomen and pelvis 11/26/2022   FINDINGS: CTA CHEST FINDINGS   Cardiovascular: Preferential opacification of the thoracic aorta. No evidence of thoracic aortic aneurysm or dissection. Cardiomegaly. No pericardial effusion. Aortic atherosclerotic calcification.   Mediastinum/Nodes: No enlarged mediastinal, hilar,  or axillary lymph nodes. Thyroid gland, trachea, and esophagus demonstrate no significant findings.   Lungs/Pleura: Subpleural reticular and ground-glass opacities compatible with interstitial lung disease greatest in the lingula and left lower lobe. Early honeycombing in the lingula. No focal consolidation, pleural effusion, or pneumothorax.    Musculoskeletal: No acute fracture or destructive osseous lesion.   Review of the MIP images confirms the above findings.   CTA ABDOMEN AND PELVIS FINDINGS   VASCULAR   Aorta: Calcified atherosclerotic plaque without significant narrowing. No aneurysm or dissection.   Celiac: Calcified plaque at the origin causes moderate narrowing. No aneurysm or dissection.   SMA: Calcified plaque at the origin causes mild narrowing. No aneurysm or dissection.   Renals: Patent right renal artery without aneurysm or dissection. Calcified plaque at the origin of the left renal artery causes at least moderate narrowing. No aneurysm or dissection.   IMA: Patent.   Inflow: Scattered calcified atherosclerotic plaque without significant narrowing. No aneurysm or dissection.   Veins: No obvious venous abnormality within the limitations of this arterial phase study.   Review of the MIP images confirms the above findings.   NON-VASCULAR   Hepatobiliary: Unremarkable liver, gallbladder, and biliary tree.   Pancreas: Unremarkable.   Spleen: Unremarkable.   Adrenals/Urinary Tract: Normal adrenal glands. No urinary calculi or hydronephrosis. Unremarkable bladder.   Stomach/Bowel: Normal caliber large and small bowel. No bowel wall thickening. Normal appendix. Stomach is within normal limits.   Lymphatic: No lymphadenopathy.   Reproductive: Enlarged prostate.   Other: No free intraperitoneal fluid or air.   Musculoskeletal: No acute fracture or destructive osseous lesion.   Review of the MIP images confirms the above findings.   IMPRESSION: 1. No evidence of aortic aneurysm or dissection. 2. No acute abnormality in the chest, abdomen, or pelvis. 3. Chronic interstitial lung disease. 4. Prostatomegaly.     Electronically Signed   By: Minerva Fester M.D.   On: 12/04/2022 22:18     OV 07/30/2023  Subjective:  Patient ID: Kenneth Pope, male , DOB: [birth year] , age 89 y.o.  , MRN: [id number] , ADDRESS: [address] [place] [address] PCP Adrian Prince, MD Patient Care Team: Adrian Prince, MD as PCP - General (Endocrinology) Rollene Rotunda, MD as PCP - Cardiology (Cardiology) Janalyn Harder, MD (Inactive) as Consulting Physician (Dermatology) Kalman Shan, MD as Consulting Physician (Pulmonary Disease)  This Provider for this visit: Treatment Team:  Attending Provider: Kalman Shan, MD    07/30/2023 -   Chief Complaint  Patient presents with   Follow-up    PFT done today. His breathing is doing well.      HPI Kenneth Pope 89 y.o. -presents for follow-up.  Here to discuss interstitial lung disease workup in progress  Interim Health status: No new complaints No new medical problems. No new surgeries. No ER visits. No Urgent care visits. No changes to medications   Results show that is high-resolution CT scan of the chest is written as probable UIP.  I personally visualized it and I think there is some honeycombing in the left side.  I think it is a definitive UIP.  It is classic craniocaudal gradient with subpleural reticulation and traction bronchiectasis bilateral subpleural.  His ANA slightly positive but he does not have any connective tissue disease symptoms.  With age greater than 42 this is IPF.  I did explain to him about IPF.  Told him it is one of the most common interstitial lung disease subtypes.  Told him given a clinical diagnosis based on the above factors.  Told him that it is invariably progressive with the average progression rate of few to several years.  I explained this is that he could end up being on oxygen in few to several years.  A small portion of patients deteriorate even more rapidly.  Indicated to him antifibrotic's are indicated.  Went over [employer] [pirfenidone and nintedanib].  Told him these medicines approved for the last 10+ years.  No other new medications.  Explained to  him the side effect profile particularly GI and the need for intensive therapeutic monitoring particularly with drug-induced liver injury.  Told him the side effects are generally reversible but he is not interested in this.  I did explain to him that benefits outweigh the risk.  He did explain that his goal is to live a long life as possible with a high quality.  End.  Indicated to him that if there are no side effects then he could get benefit from quantity without loss and quality and with good support if he had side effects we would stop the medication but he still not interested.  We then went over clinical trials as a care option.  Explained to him study involving subcutaneous injection VIxarelimab every 2 weeks versus placebo.  Explained to him all the concept of clinical research as a care option.  While he is interested in this from a scientific perspective because he has to take care of his wife he is not able to commit himself to the study.  In fact he is not able to commit himself to any interventional study  We went over IPF-pro registry which is an observational study with phone calls every 6 months through [facility].  This study sponsored by [employer]  Explained to him a blood draw and questionnaires.  He is interested in this.  Given the consent to take home and review.  His walking desaturation test is normal.   SYMPTOM SCALE - ILD 07/30/2023  Current weight   O2 use ra  Shortness of Breath 0 -> 5 scale with 5 being worst (score 6 If unable to do)  At rest 0  Simple tasks - showers, clothes change, eating, shaving 0  Household (dishes, doing bed, laundry) 0  Shopping 0  Walking level at own pace 0  Walking up Stairs 1  Total (30-36) Dyspnea Score 1  How bad is your cough? 0  How bad is your fatigue 0  How bad is nausea 0  How bad is vomiting?  0  How bad is diarrhea? 0  How bad is anxiety? 00  How bad is depression 0  Any chronic pain - if so where and  how bad 0       Simple office walk 224 (66+46 x 2) feet Pod A at [facility] x  3 laps goal with forehead probe 07/30/2023    O2 used ra   Number laps completed Only 1 walked   Comments about pace avg   Resting Pulse Ox/HR 95% and 65/min   Final Pulse Ox/HR 95% and 85/min   Desaturated </= 88% no   Desaturated <= 3% points no   Got Tachycardic >/= 90/min no   Symptoms at end of test none   Miscellaneous comments x      CT Chest data from date:   - personally visualized and independently interpreted : yes - my findings are: I think definitive UIP.  Narrative & Impression  CLINICAL DATA:  Interstitial lung disease.   EXAM: CT CHEST WITHOUT CONTRAST   TECHNIQUE: Multidetector CT imaging of the chest was performed following the standard protocol without intravenous contrast. High resolution imaging of the lungs, as well as inspiratory and expiratory imaging, was performed.   RADIATION DOSE REDUCTION: This exam was performed according to the departmental dose-optimization program which includes automated exposure control, adjustment of the mA and/or kV according to patient size and/or use of iterative reconstruction technique.   COMPARISON:  12/04/2022.   FINDINGS: Cardiovascular: Atherosclerotic calcification of the aorta, aortic valve and coronary arteries. Heart is enlarged. No pericardial effusion. Ascending aorta measures 4.1 cm (coronal image 92).   Mediastinum/Nodes: No pathologically enlarged mediastinal or axillary lymph nodes. Hilar regions are difficult to definitively evaluate without IV contrast. Esophagus is grossly unremarkable.   Lungs/Pleura: Centrilobular and paraseptal emphysema. Peripheral and somewhat basilar predominant subpleural reticulation, ground-glass, traction bronchiectasis/bronchiolectasis and probable single layer honeycombing. No pleural fluid. Airway is unremarkable. No air trapping.   Upper Abdomen: Visualized portions of the liver,  gallbladder, adrenal glands, kidneys, spleen, pancreas, stomach and bowel are grossly unremarkable. No upper abdominal adenopathy.   Musculoskeletal: Degenerative changes in the spine.   IMPRESSION: 1. Pulmonary parenchymal pattern of fibrosis, as detailed above. Findings are categorized as probable UIP per consensus guidelines: Diagnosis of Idiopathic Pulmonary Fibrosis: An Official ATS/ERS/JRS/ALAT Clinical Practice Guideline. Am Rosezetta Schlatter Crit Care Med Vol 198, Iss 5, [id number], Feb 15 2017. 2. 4.1 cm ascending aortic aneurysm. Recommend annual imaging followup by CTA or MRA. This recommendation follows 2010 ACCF/AHA/AATS/ACR/ASA/SCA/SCAI/SIR/STS/SVM Guidelines for the Diagnosis and Management of Patients with Thoracic Aortic Disease. Circulation. 2010; 121: V409-W119. Aortic aneurysm NOS (ICD10-I71.9). 3. Aortic atherosclerosis (ICD10-I70.0). Coronary artery calcification. 4.  Emphysema (ICD10-J43.9).     Electronically Signed   By: Leanna Battles M.D.   On: 07/09/2023 12:29   PFT     Latest Ref Rng & Units 07/30/2023    9:53 AM  PFT Results  FVC-Pre L 2.43  P  FVC-Predicted Pre % 71  P  FVC-Post L 2.43  P  FVC-Predicted Post % 71  P  Pre FEV1/FVC % % 79  P  Post FEV1/FCV % % 79  P  FEV1-Pre L 1.91  P  FEV1-Predicted Pre % 82  P  FEV1-Post L 1.91  P  DLCO uncorrected ml/min/mmHg 11.67  P  DLCO UNC% % 52  P  DLCO corrected ml/min/mmHg 11.67  P  DLCO COR %Predicted % 52  P  DLVA Predicted % 83  P  TLC L 4.01  P  TLC % Predicted % 60  P  RV % Predicted % 59  P    P Preliminary result     Latest Reference Range & Units 05/20/23 09:27  ANA Titer 1  Positive !  dsDNA Ab 0 - 9 IU/mL 1  ENA RNP Ab 0.0 - 0.9 AI 0.2  ENA SSA (RO) Ab 0.0 - 0.9 AI <0.2  ENA SSB (LA) Ab 0.0 - 0.9 AI 0.2  ENA SM Ab Ser-aCnc 0.0 - 0.9 AI <0.2  Speckled Pattern  1:320 (H)  NOTE:  Comment  Scleroderma (Scl-70) (ENA) Antibody, IgG 0.0 - 0.9 AI <0.2  !: Data is abnormal (H): Data is  abnormally high   LAB RESULTS last 96 hours No results found.       has a past medical history of Arthritis, BCC (basal cell carcinoma of skin) (12/08/2013), CAD (coronary artery disease) (  05/04/2018), Cancer (HCC), Former tobacco use, Hyperlipidemia, Hypertension, Macular degeneration, Nodular basal cell carcinoma (BCC) (08/15/2015), Nodular basal cell carcinoma (BCC) (09/08/2017), SCC (squamous cell carcinoma) (05/23/2016), SCCA (squamous cell carcinoma) of skin (11/30/2019), SCCA (squamous cell carcinoma) of skin (11/30/2019), Squamous cell carcinoma in situ (SCCIS) (05/20/2017), and Superficial basal cell carcinoma (BCC) (11/30/2019).   reports that he quit smoking about 49 years ago. His smoking use included cigarettes. He started smoking about 64 years ago. He has never used smokeless tobacco.  Past Surgical History:  Procedure Laterality Date   CARDIAC CATHETERIZATION     CORONARY STENT INTERVENTION N/A 05/05/2018   Procedure: CORONARY STENT INTERVENTION;  Surgeon: Lennette Bihari, MD;  Location: [facility];  Service: Cardiovascular;  Laterality: N/A;   HERNIA REPAIR     LEFT HEART CATH AND CORONARY ANGIOGRAPHY N/A 05/05/2018   Procedure: LEFT HEART CATH AND CORONARY ANGIOGRAPHY;  Surgeon: Lennette Bihari, MD;  Location: [facility] CV [facility];  Service: Cardiovascular;  Laterality: N/A;   TOTAL HIP ARTHROPLASTY Right 04/02/2023   Procedure: TOTAL HIP ARTHROPLASTY ANTERIOR APPROACH VS HEMI;  Surgeon: Samson Frederic, MD;  Location: [facility];  Service: Orthopedics;  Laterality: Right;    No Known Allergies  Immunization History  Administered Date(s) Administered   Influenza, Quadrivalent, Recombinant, Inj, Pf 03/03/2023   PFIZER(Purple Top)SARS-COV-2 Vaccination 07/02/2019, 07/24/2019   Pneumococcal Conjugate-13 05/26/2014, 09/18/2015   Pneumococcal-Unspecified 12/15/2001    Family History  Problem Relation Age of Onset   CAD Neg Hx      Current Outpatient  Medications:    acetaminophen (TYLENOL) 500 MG tablet, Take 500 mg by mouth as needed for headache., Disp: , Rfl:    amLODipine (NORVASC) 10 MG tablet, TAKE 1 TABLET BY MOUTH EVERY DAY (Patient taking differently: Take 10 mg by mouth daily.), Disp: 90 tablet, Rfl: 2   aspirin EC 81 MG tablet, Take 81 mg by mouth daily. Swallow whole., Disp: , Rfl:    atorvastatin (LIPITOR) 80 MG tablet, TAKE 1 TABLET(80 MG) BY MOUTH DAILY AT 6 PM, Disp: 90 tablet, Rfl: 3   Calcium Carb-Cholecalciferol (CALCIUM 600+D3 PO), Take 1 tablet by mouth daily after breakfast., Disp: , Rfl:    carvedilol (COREG) 3.125 MG tablet, Take 1 tablet (3.125 mg total) by mouth 2 (two) times daily with a meal., Disp: 180 tablet, Rfl: 3   dorzolamide-timolol (COSOPT) 22.3-6.8 MG/ML ophthalmic solution, Place 1 drop into both eyes See admin instructions. Instill 1 drop into each eye at 10 AM and 5 PM, Disp: , Rfl:    Multiple Vitamin (MULTIVITAMIN WITH MINERALS) TABS tablet, Take 1 tablet by mouth daily with breakfast., Disp: , Rfl:    Multiple Vitamins-Minerals (PRESERVISION AREDS 2) CAPS, Take 1 capsule by mouth 2 (two) times daily. , Disp: , Rfl:    nitroGLYCERIN (NITROSTAT) 0.4 MG SL tablet, Place 1 tablet (0.4 mg total) under the tongue every 5 (five) minutes as needed for chest pain., Disp: 25 tablet, Rfl: 11   trimethoprim-polymyxin b (POLYTRIM) ophthalmic solution, Place 1 drop into the left eye See admin instructions. Instill 1 drop into the left eye four times a day for 2 days after injection, Disp: , Rfl:       Objective:   Vitals:   07/30/23 1056 07/30/23 1057  BP:  100/62  Pulse: 66   Temp:  98 F (36.7 C)  TempSrc:  Oral  SpO2: 95%   Weight:  162 lb (73.5 kg)  Height:  5\' 8"  (1.727 m)  Estimated body mass index is 24.63 kg/m as calculated from the following:   Height as of this encounter: 5\' 8"  (1.727 m).   Weight as of this encounter: 162 lb (73.5 kg).  @WEIGHTCHANGE @  [employer]   07/30/23 1057   Weight: 162 lb (73.5 kg)     Physical Exam   General: No distress. Looks well. O2 at rest: no Cane present: no Sitting in wheel chair: no Frail: mild yes Obese: no Neuro: Alert and Oriented x 3. GCS 15. Speech normal Psych: Pleasant Resp:  Barrel Chest - no.  Wheeze - no, Crackles - yes , No overt respiratory distress CVS: Normal heart sounds. Murmurs - no Ext: Stigmata of Connective Tissue Disease - no HEENT: Normal upper airway. PEERL +. No post nasal drip        Assessment:       ICD-10-CM   1. IPF (idiopathic pulmonary fibrosis) (HCC)  J84.112     2. ANA positive  R76.8          Plan:     Patient Instructions     ICD-10-CM   1. IPF (idiopathic pulmonary fibrosis) (HCC)  J84.112       -Given your diagnosis of IPF based on age greater than 12, male gender, Caucasian ethnicity, autoimmune panel negative except for slight positive ANA, absence of connective tissue disease, description of probable UIP [although I think it is definitive UIP] on CT scan  -Positive ANA most likely age-related.  Currently observation recommended in the absence of significant connective tissue disease.  PLAN -based on detailed shared decision making  -Respect no antifibrotic standard of care therapy -Respect no intervention clinical trials -Support observation and possible intervention if there is decline -He should do interest in participation in IPF-PRO registry.  Followup  -6 months spirometry and dlco; 15 min visit Geneve Kimpel   FOLLOWUP Return in about 6 months (around 01/27/2024) for 15 min visit, ILD, Face to Face Visit.  ( Level 05 visit E&M 2024: Estb >= 40 min  visit type: on-site physical face to visit  in total care time and counseling or/and coordination of care by this undersigned MD - Dr Kalman Shan. This includes one or more of the following on this same day 07/30/2023: pre-charting, chart review, note writing, documentation discussion of test results,  diagnostic or treatment recommendations, prognosis, risks and benefits of management options, instructions, education, compliance or risk-factor reduction. It excludes time spent by the CMA or office staff in the care of the patient. Actual time 40 min)   SIGNATURE    Dr. Kalman Shan, M.D., F.C.C.P,  Pulmonary and Critical Care Medicine Staff Physician, [facility] Director - Interstitial Lung Disease  Program  Pulmonary Fibrosis [facility] at [facility] [place], [place], [address]  Pager: [phone], If no answer or between  15:00h - 7:00h: call [phone] Telephone: [phone]  11:35 AM 07/30/2023

## 2023-08-04 ENCOUNTER — Encounter (INDEPENDENT_AMBULATORY_CARE_PROVIDER_SITE_OTHER): Payer: Medicare Other | Admitting: Ophthalmology

## 2023-08-04 DIAGNOSIS — H33301 Unspecified retinal break, right eye: Secondary | ICD-10-CM

## 2023-08-04 DIAGNOSIS — H43813 Vitreous degeneration, bilateral: Secondary | ICD-10-CM

## 2023-08-04 DIAGNOSIS — I10 Essential (primary) hypertension: Secondary | ICD-10-CM

## 2023-08-04 DIAGNOSIS — H353112 Nonexudative age-related macular degeneration, right eye, intermediate dry stage: Secondary | ICD-10-CM | POA: Diagnosis not present

## 2023-08-04 DIAGNOSIS — H353221 Exudative age-related macular degeneration, left eye, with active choroidal neovascularization: Secondary | ICD-10-CM

## 2023-08-04 DIAGNOSIS — H35033 Hypertensive retinopathy, bilateral: Secondary | ICD-10-CM | POA: Diagnosis not present

## 2023-08-20 DIAGNOSIS — I10 Essential (primary) hypertension: Secondary | ICD-10-CM | POA: Diagnosis not present

## 2023-08-20 DIAGNOSIS — R7301 Impaired fasting glucose: Secondary | ICD-10-CM | POA: Diagnosis not present

## 2023-08-20 DIAGNOSIS — J849 Interstitial pulmonary disease, unspecified: Secondary | ICD-10-CM | POA: Diagnosis not present

## 2023-08-26 DIAGNOSIS — H353111 Nonexudative age-related macular degeneration, right eye, early dry stage: Secondary | ICD-10-CM | POA: Diagnosis not present

## 2023-08-26 DIAGNOSIS — H401123 Primary open-angle glaucoma, left eye, severe stage: Secondary | ICD-10-CM | POA: Diagnosis not present

## 2023-08-26 DIAGNOSIS — H401111 Primary open-angle glaucoma, right eye, mild stage: Secondary | ICD-10-CM | POA: Diagnosis not present

## 2023-08-26 DIAGNOSIS — H353221 Exudative age-related macular degeneration, left eye, with active choroidal neovascularization: Secondary | ICD-10-CM | POA: Diagnosis not present

## 2023-09-08 ENCOUNTER — Encounter (INDEPENDENT_AMBULATORY_CARE_PROVIDER_SITE_OTHER): Payer: Medicare Other | Admitting: Ophthalmology

## 2023-09-08 DIAGNOSIS — H35033 Hypertensive retinopathy, bilateral: Secondary | ICD-10-CM | POA: Diagnosis not present

## 2023-09-08 DIAGNOSIS — H353221 Exudative age-related macular degeneration, left eye, with active choroidal neovascularization: Secondary | ICD-10-CM

## 2023-09-08 DIAGNOSIS — I10 Essential (primary) hypertension: Secondary | ICD-10-CM

## 2023-09-08 DIAGNOSIS — H353112 Nonexudative age-related macular degeneration, right eye, intermediate dry stage: Secondary | ICD-10-CM | POA: Diagnosis not present

## 2023-09-08 DIAGNOSIS — H43813 Vitreous degeneration, bilateral: Secondary | ICD-10-CM

## 2023-09-08 DIAGNOSIS — H33301 Unspecified retinal break, right eye: Secondary | ICD-10-CM

## 2023-09-19 DIAGNOSIS — S72031D Displaced midcervical fracture of right femur, subsequent encounter for closed fracture with routine healing: Secondary | ICD-10-CM | POA: Diagnosis not present

## 2023-09-19 DIAGNOSIS — Z96641 Presence of right artificial hip joint: Secondary | ICD-10-CM | POA: Diagnosis not present

## 2023-10-13 ENCOUNTER — Encounter (INDEPENDENT_AMBULATORY_CARE_PROVIDER_SITE_OTHER): Admitting: Ophthalmology

## 2023-10-13 DIAGNOSIS — H33301 Unspecified retinal break, right eye: Secondary | ICD-10-CM | POA: Diagnosis not present

## 2023-10-13 DIAGNOSIS — H353112 Nonexudative age-related macular degeneration, right eye, intermediate dry stage: Secondary | ICD-10-CM | POA: Diagnosis not present

## 2023-10-13 DIAGNOSIS — H43813 Vitreous degeneration, bilateral: Secondary | ICD-10-CM | POA: Diagnosis not present

## 2023-10-13 DIAGNOSIS — H353221 Exudative age-related macular degeneration, left eye, with active choroidal neovascularization: Secondary | ICD-10-CM

## 2023-10-14 DIAGNOSIS — D485 Neoplasm of uncertain behavior of skin: Secondary | ICD-10-CM | POA: Diagnosis not present

## 2023-10-14 DIAGNOSIS — L570 Actinic keratosis: Secondary | ICD-10-CM | POA: Diagnosis not present

## 2023-10-14 DIAGNOSIS — L821 Other seborrheic keratosis: Secondary | ICD-10-CM | POA: Diagnosis not present

## 2023-10-23 DIAGNOSIS — C44319 Basal cell carcinoma of skin of other parts of face: Secondary | ICD-10-CM | POA: Diagnosis not present

## 2023-11-17 ENCOUNTER — Encounter (INDEPENDENT_AMBULATORY_CARE_PROVIDER_SITE_OTHER): Admitting: Ophthalmology

## 2023-11-17 DIAGNOSIS — H43813 Vitreous degeneration, bilateral: Secondary | ICD-10-CM

## 2023-11-17 DIAGNOSIS — H353112 Nonexudative age-related macular degeneration, right eye, intermediate dry stage: Secondary | ICD-10-CM | POA: Diagnosis not present

## 2023-11-17 DIAGNOSIS — H35033 Hypertensive retinopathy, bilateral: Secondary | ICD-10-CM | POA: Diagnosis not present

## 2023-11-17 DIAGNOSIS — I10 Essential (primary) hypertension: Secondary | ICD-10-CM

## 2023-11-17 DIAGNOSIS — H33301 Unspecified retinal break, right eye: Secondary | ICD-10-CM

## 2023-11-17 DIAGNOSIS — H353221 Exudative age-related macular degeneration, left eye, with active choroidal neovascularization: Secondary | ICD-10-CM

## 2023-11-18 DIAGNOSIS — K080 Exfoliation of teeth due to systemic causes: Secondary | ICD-10-CM | POA: Diagnosis not present

## 2023-12-16 DIAGNOSIS — L570 Actinic keratosis: Secondary | ICD-10-CM | POA: Diagnosis not present

## 2023-12-16 DIAGNOSIS — D485 Neoplasm of uncertain behavior of skin: Secondary | ICD-10-CM | POA: Diagnosis not present

## 2023-12-22 ENCOUNTER — Encounter (INDEPENDENT_AMBULATORY_CARE_PROVIDER_SITE_OTHER): Admitting: Ophthalmology

## 2023-12-22 DIAGNOSIS — H353112 Nonexudative age-related macular degeneration, right eye, intermediate dry stage: Secondary | ICD-10-CM

## 2023-12-22 DIAGNOSIS — H33301 Unspecified retinal break, right eye: Secondary | ICD-10-CM

## 2023-12-22 DIAGNOSIS — H353221 Exudative age-related macular degeneration, left eye, with active choroidal neovascularization: Secondary | ICD-10-CM | POA: Diagnosis not present

## 2023-12-22 DIAGNOSIS — H43813 Vitreous degeneration, bilateral: Secondary | ICD-10-CM | POA: Diagnosis not present

## 2023-12-30 DIAGNOSIS — D044 Carcinoma in situ of skin of scalp and neck: Secondary | ICD-10-CM | POA: Diagnosis not present

## 2024-01-22 ENCOUNTER — Encounter (INDEPENDENT_AMBULATORY_CARE_PROVIDER_SITE_OTHER): Admitting: Ophthalmology

## 2024-01-22 DIAGNOSIS — H353221 Exudative age-related macular degeneration, left eye, with active choroidal neovascularization: Secondary | ICD-10-CM | POA: Diagnosis not present

## 2024-01-22 DIAGNOSIS — H43813 Vitreous degeneration, bilateral: Secondary | ICD-10-CM

## 2024-01-22 DIAGNOSIS — H33301 Unspecified retinal break, right eye: Secondary | ICD-10-CM | POA: Diagnosis not present

## 2024-01-22 DIAGNOSIS — H353112 Nonexudative age-related macular degeneration, right eye, intermediate dry stage: Secondary | ICD-10-CM | POA: Diagnosis not present

## 2024-01-26 ENCOUNTER — Other Ambulatory Visit: Payer: Self-pay | Admitting: Cardiology

## 2024-01-28 ENCOUNTER — Telehealth: Payer: Self-pay | Admitting: Cardiology

## 2024-01-28 NOTE — Telephone Encounter (Signed)
*  STAT* If patient is at the pharmacy, call can be transferred to refill team.   1. Which medications need to be refilled? (please list name of each medication and dose if known) carvedilol  (COREG ) 3.125 MG tablet    2. Would you like to learn more about the convenience, safety, & potential cost savings by using the [facility]? No     3. Are you open to using the [facility] (Type [facility]. ). No   4. Which pharmacy/location (including street and city if local pharmacy) is medication to be sent to? [id number] - [place], [place] - [address] AT [facility] [address]     5. Do they need a 30 day or 90 day supply? 90 day   Pt has two days left of medication

## 2024-02-19 ENCOUNTER — Encounter (INDEPENDENT_AMBULATORY_CARE_PROVIDER_SITE_OTHER): Admitting: Ophthalmology

## 2024-02-19 ENCOUNTER — Ambulatory Visit: Admitting: Cardiology

## 2024-02-19 DIAGNOSIS — H33301 Unspecified retinal break, right eye: Secondary | ICD-10-CM | POA: Diagnosis not present

## 2024-02-19 DIAGNOSIS — H353112 Nonexudative age-related macular degeneration, right eye, intermediate dry stage: Secondary | ICD-10-CM | POA: Diagnosis not present

## 2024-02-19 DIAGNOSIS — H43813 Vitreous degeneration, bilateral: Secondary | ICD-10-CM | POA: Diagnosis not present

## 2024-02-19 DIAGNOSIS — H353221 Exudative age-related macular degeneration, left eye, with active choroidal neovascularization: Secondary | ICD-10-CM | POA: Diagnosis not present

## 2024-02-23 DIAGNOSIS — D485 Neoplasm of uncertain behavior of skin: Secondary | ICD-10-CM | POA: Diagnosis not present

## 2024-02-23 DIAGNOSIS — L821 Other seborrheic keratosis: Secondary | ICD-10-CM | POA: Diagnosis not present

## 2024-02-24 NOTE — Progress Notes (Unsigned)
  Cardiology Office Note:   Date:  02/24/2024  ID:  Kenneth Pope, DOB 03/01/1934, MRN [id number] PCP: Nichole Senior, MD  [facility] Providers Cardiologist:  Lynwood Schilling, MD {  History of Present Illness:   Kenneth Pope is a 89 y.o. male who presents for follow-up of coronary disease.  He was admitted in 2019 with syncope.  He had an elevated troponin.  He eventually had a cardiac catheterization demonstrating 50% stenosis in the LAD.  He had 85% stenosis in the right coronary artery.  He had DES to the RCA.  He was noted also to have probable coronary spasm in the LAD.  His syncope was thought to be related possibly to an arrhythmia and he was started on a beta-blocker.  ACE inhibitor was stopped.  He did have an event monitor and no arrhythmia was noted.     Since she was last seen ***  ***  he has done well.  He walks most days. The patient denies any new symptoms such as chest discomfort, neck or arm discomfort. There has been no new shortness of breath, PND or orthopnea. There have been no reported palpitations, presyncope or syncope.   ROS: ***  Studies Reviewed:    EKG:       ***  Risk Assessment/Calculations:   {Does this patient have ATRIAL FIBRILLATION?:[phone]} No BP recorded.  {Refresh Note OR Click here to enter BP  :1}***        Physical Exam:   VS:  There were no vitals taken for this visit.   Wt Readings from Last 3 Encounters:  07/30/23 162 lb (73.5 kg)  05/20/23 161 lb (73 kg)  04/01/23 161 lb (73 kg)     GEN: Well nourished, well developed in no acute distress NECK: No JVD; No carotid bruits CARDIAC: ***RR, *** murmurs, rubs, gallops RESPIRATORY:  Clear to auscultation without rales, wheezing or rhonchi  ABDOMEN: Soft, non-tender, non-distended EXTREMITIES:  No edema; No deformity   ASSESSMENT AND PLAN:   CAD.  S/p PCI with DES to proximal RCA November 2019.  ***  He has had no new symptoms.  No change in therapy.    Hypertension:    The blood pressure is ***  at target.  No change in therapy.   Hyperlipidemia.  LDL was *** 54 with an HDL of 53.  No change in therapy.     Follow up ***  Signed, Lynwood Schilling, MD

## 2024-02-26 ENCOUNTER — Encounter: Payer: Self-pay | Admitting: Cardiology

## 2024-02-26 ENCOUNTER — Ambulatory Visit: Attending: Cardiology | Admitting: Cardiology

## 2024-02-26 VITALS — BP 100/62 | HR 62 | Ht 68.0 in | Wt 166.0 lb

## 2024-02-26 DIAGNOSIS — I2510 Atherosclerotic heart disease of native coronary artery without angina pectoris: Secondary | ICD-10-CM | POA: Diagnosis not present

## 2024-02-26 DIAGNOSIS — I10 Essential (primary) hypertension: Secondary | ICD-10-CM | POA: Diagnosis not present

## 2024-02-26 DIAGNOSIS — E785 Hyperlipidemia, unspecified: Secondary | ICD-10-CM | POA: Diagnosis not present

## 2024-02-26 MED ORDER — CARVEDILOL 3.125 MG PO TABS: 3.1250 mg | ORAL_TABLET | Freq: Two times a day (BID) | ORAL | 3 refills | Status: AC

## 2024-02-26 NOTE — Patient Instructions (Signed)

## 2024-03-02 DIAGNOSIS — D044 Carcinoma in situ of skin of scalp and neck: Secondary | ICD-10-CM | POA: Diagnosis not present

## 2024-03-17 DIAGNOSIS — E785 Hyperlipidemia, unspecified: Secondary | ICD-10-CM | POA: Diagnosis not present

## 2024-03-17 DIAGNOSIS — H401111 Primary open-angle glaucoma, right eye, mild stage: Secondary | ICD-10-CM | POA: Diagnosis not present

## 2024-03-17 DIAGNOSIS — R7301 Impaired fasting glucose: Secondary | ICD-10-CM | POA: Diagnosis not present

## 2024-03-17 DIAGNOSIS — H353221 Exudative age-related macular degeneration, left eye, with active choroidal neovascularization: Secondary | ICD-10-CM | POA: Diagnosis not present

## 2024-03-17 DIAGNOSIS — Z125 Encounter for screening for malignant neoplasm of prostate: Secondary | ICD-10-CM | POA: Diagnosis not present

## 2024-03-17 DIAGNOSIS — H353111 Nonexudative age-related macular degeneration, right eye, early dry stage: Secondary | ICD-10-CM | POA: Diagnosis not present

## 2024-03-17 DIAGNOSIS — Z0189 Encounter for other specified special examinations: Secondary | ICD-10-CM | POA: Diagnosis not present

## 2024-03-17 DIAGNOSIS — H401123 Primary open-angle glaucoma, left eye, severe stage: Secondary | ICD-10-CM | POA: Diagnosis not present

## 2024-03-17 DIAGNOSIS — H1045 Other chronic allergic conjunctivitis: Secondary | ICD-10-CM | POA: Diagnosis not present

## 2024-03-18 ENCOUNTER — Encounter (INDEPENDENT_AMBULATORY_CARE_PROVIDER_SITE_OTHER): Admitting: Ophthalmology

## 2024-03-18 DIAGNOSIS — H353221 Exudative age-related macular degeneration, left eye, with active choroidal neovascularization: Secondary | ICD-10-CM

## 2024-03-18 DIAGNOSIS — I10 Essential (primary) hypertension: Secondary | ICD-10-CM

## 2024-03-18 DIAGNOSIS — H353112 Nonexudative age-related macular degeneration, right eye, intermediate dry stage: Secondary | ICD-10-CM

## 2024-03-18 DIAGNOSIS — H35033 Hypertensive retinopathy, bilateral: Secondary | ICD-10-CM | POA: Diagnosis not present

## 2024-03-18 DIAGNOSIS — H43813 Vitreous degeneration, bilateral: Secondary | ICD-10-CM

## 2024-03-26 DIAGNOSIS — Z0000 Encounter for general adult medical examination without abnormal findings: Secondary | ICD-10-CM | POA: Diagnosis not present

## 2024-03-26 DIAGNOSIS — I10 Essential (primary) hypertension: Secondary | ICD-10-CM | POA: Diagnosis not present

## 2024-03-26 DIAGNOSIS — Z1339 Encounter for screening examination for other mental health and behavioral disorders: Secondary | ICD-10-CM | POA: Diagnosis not present

## 2024-03-26 DIAGNOSIS — Z1331 Encounter for screening for depression: Secondary | ICD-10-CM | POA: Diagnosis not present

## 2024-03-30 DIAGNOSIS — Z96641 Presence of right artificial hip joint: Secondary | ICD-10-CM | POA: Diagnosis not present

## 2024-03-30 DIAGNOSIS — C4441 Basal cell carcinoma of skin of scalp and neck: Secondary | ICD-10-CM | POA: Diagnosis not present

## 2024-03-30 DIAGNOSIS — L821 Other seborrheic keratosis: Secondary | ICD-10-CM | POA: Diagnosis not present

## 2024-03-30 DIAGNOSIS — D044 Carcinoma in situ of skin of scalp and neck: Secondary | ICD-10-CM | POA: Diagnosis not present

## 2024-03-30 DIAGNOSIS — S72031D Displaced midcervical fracture of right femur, subsequent encounter for closed fracture with routine healing: Secondary | ICD-10-CM | POA: Diagnosis not present

## 2024-04-15 ENCOUNTER — Encounter (INDEPENDENT_AMBULATORY_CARE_PROVIDER_SITE_OTHER): Admitting: Ophthalmology

## 2024-04-15 DIAGNOSIS — H353221 Exudative age-related macular degeneration, left eye, with active choroidal neovascularization: Secondary | ICD-10-CM | POA: Diagnosis not present

## 2024-04-15 DIAGNOSIS — H353112 Nonexudative age-related macular degeneration, right eye, intermediate dry stage: Secondary | ICD-10-CM

## 2024-04-15 DIAGNOSIS — H43813 Vitreous degeneration, bilateral: Secondary | ICD-10-CM | POA: Diagnosis not present

## 2024-04-28 DIAGNOSIS — L821 Other seborrheic keratosis: Secondary | ICD-10-CM | POA: Diagnosis not present

## 2024-05-03 ENCOUNTER — Other Ambulatory Visit: Payer: Self-pay | Admitting: Cardiology

## 2024-05-03 DIAGNOSIS — E785 Hyperlipidemia, unspecified: Secondary | ICD-10-CM

## 2024-05-05 NOTE — Telephone Encounter (Signed)
 Rx sent to pharmacy

## 2024-05-05 NOTE — Telephone Encounter (Signed)
 Pt is calling to check status of refill. I think it was sent to [facility]. Pt is out of medication

## 2024-05-19 ENCOUNTER — Encounter (INDEPENDENT_AMBULATORY_CARE_PROVIDER_SITE_OTHER): Admitting: Ophthalmology

## 2024-05-19 DIAGNOSIS — H43813 Vitreous degeneration, bilateral: Secondary | ICD-10-CM

## 2024-05-19 DIAGNOSIS — H353221 Exudative age-related macular degeneration, left eye, with active choroidal neovascularization: Secondary | ICD-10-CM

## 2024-05-19 DIAGNOSIS — H353112 Nonexudative age-related macular degeneration, right eye, intermediate dry stage: Secondary | ICD-10-CM

## 2024-05-27 DIAGNOSIS — K080 Exfoliation of teeth due to systemic causes: Secondary | ICD-10-CM | POA: Diagnosis not present

## 2024-06-23 ENCOUNTER — Encounter (INDEPENDENT_AMBULATORY_CARE_PROVIDER_SITE_OTHER): Admitting: Ophthalmology

## 2024-06-23 DIAGNOSIS — H353112 Nonexudative age-related macular degeneration, right eye, intermediate dry stage: Secondary | ICD-10-CM

## 2024-06-23 DIAGNOSIS — H43813 Vitreous degeneration, bilateral: Secondary | ICD-10-CM

## 2024-06-23 DIAGNOSIS — H353221 Exudative age-related macular degeneration, left eye, with active choroidal neovascularization: Secondary | ICD-10-CM

## 2024-07-28 ENCOUNTER — Encounter (INDEPENDENT_AMBULATORY_CARE_PROVIDER_SITE_OTHER): Admitting: Ophthalmology
